# Patient Record
Sex: Female | Born: 1960
Health system: Southern US, Community
[De-identification: ages and names within clinical notes are randomized; demographics above are authoritative.]

## PROBLEM LIST (undated history)

## (undated) DIAGNOSIS — R42 Dizziness and giddiness: Secondary | ICD-10-CM

## (undated) DIAGNOSIS — T7840XA Allergy, unspecified, initial encounter: Secondary | ICD-10-CM

## (undated) DIAGNOSIS — E785 Hyperlipidemia, unspecified: Secondary | ICD-10-CM

## (undated) DIAGNOSIS — K759 Inflammatory liver disease, unspecified: Secondary | ICD-10-CM

## (undated) DIAGNOSIS — E119 Type 2 diabetes mellitus without complications: Secondary | ICD-10-CM

## (undated) DIAGNOSIS — I341 Nonrheumatic mitral (valve) prolapse: Secondary | ICD-10-CM

## (undated) DIAGNOSIS — S139XXA Sprain of joints and ligaments of unspecified parts of neck, initial encounter: Secondary | ICD-10-CM

## (undated) DIAGNOSIS — K219 Gastro-esophageal reflux disease without esophagitis: Secondary | ICD-10-CM

## (undated) DIAGNOSIS — I10 Essential (primary) hypertension: Secondary | ICD-10-CM

## (undated) HISTORY — PX: ABDOMINAL HYSTERECTOMY: SHX81

## (undated) HISTORY — DX: Gastro-esophageal reflux disease without esophagitis: K21.9

## (undated) HISTORY — PX: TUBAL LIGATION: SHX77

## (undated) HISTORY — DX: Allergy, unspecified, initial encounter: T78.40XA

## (undated) HISTORY — DX: Type 2 diabetes mellitus without complications: E11.9

## (undated) HISTORY — DX: Hyperlipidemia, unspecified: E78.5

## (undated) HISTORY — DX: Dizziness and giddiness: R42

## (undated) HISTORY — DX: Nonrheumatic mitral (valve) prolapse: I34.1

## (undated) HISTORY — DX: Essential (primary) hypertension: I10

## (undated) HISTORY — DX: Sprain of joints and ligaments of unspecified parts of neck, initial encounter: S13.9XXA

## (undated) HISTORY — PX: DILATION AND CURETTAGE OF UTERUS: SHX78

---

## 1992-07-10 HISTORY — PX: TOTAL VAGINAL HYSTERECTOMY: SHX2548

## 2005-02-16 ENCOUNTER — Ambulatory Visit: Payer: Self-pay | Admitting: Family Medicine

## 2005-03-31 ENCOUNTER — Ambulatory Visit: Payer: Self-pay | Admitting: Family Medicine

## 2006-05-11 ENCOUNTER — Ambulatory Visit: Payer: Self-pay | Admitting: Family Medicine

## 2006-05-16 ENCOUNTER — Ambulatory Visit: Payer: Self-pay | Admitting: Family Medicine

## 2009-02-23 ENCOUNTER — Ambulatory Visit: Payer: Self-pay

## 2009-03-03 ENCOUNTER — Ambulatory Visit: Payer: Self-pay

## 2010-04-13 ENCOUNTER — Ambulatory Visit: Payer: Self-pay | Admitting: Family Medicine

## 2010-04-20 ENCOUNTER — Ambulatory Visit: Payer: Self-pay | Admitting: Gastroenterology

## 2010-04-22 LAB — PATHOLOGY REPORT

## 2010-07-25 ENCOUNTER — Ambulatory Visit: Payer: Self-pay | Admitting: Gastroenterology

## 2011-04-25 ENCOUNTER — Emergency Department: Payer: Self-pay | Admitting: *Deleted

## 2011-05-29 ENCOUNTER — Ambulatory Visit: Payer: Self-pay | Admitting: General Practice

## 2011-05-30 ENCOUNTER — Ambulatory Visit: Payer: Self-pay | Admitting: Family Medicine

## 2011-09-06 ENCOUNTER — Ambulatory Visit: Payer: Self-pay | Admitting: Gastroenterology

## 2011-09-06 LAB — HM COLONOSCOPY

## 2012-05-15 ENCOUNTER — Encounter: Payer: Self-pay | Admitting: Family Medicine

## 2012-05-30 ENCOUNTER — Ambulatory Visit: Payer: Self-pay

## 2012-06-09 ENCOUNTER — Encounter: Payer: Self-pay | Admitting: Family Medicine

## 2013-06-02 ENCOUNTER — Ambulatory Visit: Payer: Self-pay

## 2014-04-15 LAB — HM PAP SMEAR

## 2014-04-21 ENCOUNTER — Ambulatory Visit: Payer: Self-pay

## 2014-06-03 ENCOUNTER — Ambulatory Visit: Payer: Self-pay

## 2014-06-03 LAB — HM MAMMOGRAPHY

## 2015-01-22 ENCOUNTER — Encounter: Payer: Self-pay | Admitting: Family Medicine

## 2015-01-22 ENCOUNTER — Ambulatory Visit (INDEPENDENT_AMBULATORY_CARE_PROVIDER_SITE_OTHER): Payer: 59 | Admitting: Family Medicine

## 2015-01-22 VITALS — BP 138/84 | HR 75 | Temp 98.6°F | Ht 62.0 in | Wt 147.5 lb

## 2015-01-22 DIAGNOSIS — R609 Edema, unspecified: Secondary | ICD-10-CM | POA: Diagnosis not present

## 2015-01-22 LAB — CBC WITH DIFFERENTIAL/PLATELET
HEMOGLOBIN: 13 g/dL (ref 11.1–15.9)
Hematocrit: 38.5 % (ref 34.0–46.6)
LYMPHS: 37 %
Lymphocytes Absolute: 1.4 10*3/uL (ref 0.7–3.1)
MCH: 30.2 pg (ref 26.6–33.0)
MCHC: 33.8 g/dL (ref 31.5–35.7)
MCV: 89 fL (ref 79–97)
MID (ABSOLUTE): 0.4 10*3/uL (ref 0.1–1.6)
MID: 11 %
Neutrophils Absolute: 2.1 10*3/uL (ref 1.4–7.0)
Neutrophils: 53 %
Platelets: 284 10*3/uL (ref 150–379)
RBC: 4.31 x10E6/uL (ref 3.77–5.28)
RDW: 13.6 % (ref 12.3–15.4)
WBC: 3.9 10*3/uL (ref 3.4–10.8)

## 2015-01-22 NOTE — Patient Instructions (Signed)

## 2015-01-22 NOTE — Progress Notes (Signed)
BP 138/84 mmHg  Pulse 75  Temp(Src) 98.6 F (37 C)  Ht 5\' 2"  (1.575 m)  Wt 147 lb 8 oz (66.906 kg)  BMI 26.97 kg/m2  SpO2 99%   Subjective:    Patient ID: Linda Boyle, female    DOB: 1961-03-27, 54 y.o.   MRN: [id number]  HPI: Linda Boyle is a 54 y.o. female  Chief Complaint  Patient presents with  . Edema    ankle's down to feet swell when she has been on her feet.    Ankles have been swelling for about 2 weeks. Used to be off and on, now almost every day. Tend to be better in the morning and then worse in the evening. No pain. Some tight feeling. On her feet most of the day. Hasn't tried anything for it right now. Had done some elevation and that has helped. Otherwise feeling well with no other concerns or complaints at this time.   Relevant past medical, surgical, family and social history reviewed and updated as indicated. Interim medical history since our last visit reviewed. Allergies and medications reviewed and updated.  Review of Systems  Constitutional: Negative.   Respiratory: Negative.   Cardiovascular: Negative.   Musculoskeletal: Negative.   Skin: Negative.   Neurological: Positive for numbness. Negative for dizziness, tremors, seizures, syncope, facial asymmetry, speech difficulty, weakness, light-headedness and headaches.   Per HPI unless specifically indicated above     Objective:    BP 138/84 mmHg  Pulse 75  Temp(Src) 98.6 F (37 C)  Ht 5\' 2"  (1.575 m)  Wt 147 lb 8 oz (66.906 kg)  BMI 26.97 kg/m2  SpO2 99%  Wt Readings from Last 3 Encounters:  01/22/15 147 lb 8 oz (66.906 kg)  10/16/14 145 lb (65.772 kg)    Physical Exam  Constitutional: She is oriented to person, place, and time. She appears well-developed and well-nourished. No distress.  HENT:  Head: Normocephalic and atraumatic.  Right Ear: Hearing normal.  Left Ear: Hearing normal.  Nose: Nose normal.  Eyes: Conjunctivae and lids are normal. Right eye exhibits no discharge.  Left eye exhibits no discharge. No scleral icterus.  Cardiovascular: Normal rate, regular rhythm, normal heart sounds and intact distal pulses.  Exam reveals no gallop and no friction rub.   No murmur heard. Pulmonary/Chest: Effort normal and breath sounds normal. No respiratory distress. She has no wheezes. She has no rales. She exhibits no tenderness.  Musculoskeletal: Normal range of motion.       Right ankle: Normal. She exhibits normal range of motion, no swelling, no ecchymosis, no deformity, no laceration and normal pulse.       Left ankle: Normal. She exhibits normal range of motion, no swelling, no ecchymosis, no deformity, no laceration and normal pulse.       Right lower leg: Normal.       Left lower leg: Normal.  Negative Homan's, Negative squeeze, Trace edema   Neurological: She is alert and oriented to person, place, and time.  Skin: Skin is warm, dry and intact. No rash noted. No erythema. No pallor.  Psychiatric: She has a normal mood and affect. Her speech is normal and behavior is normal. Judgment and thought content normal. Cognition and memory are normal.  Nursing note and vitals reviewed.     Assessment & Plan:   Problem List Items Addressed This Visit    None    Visit Diagnoses    Edema    -  Primary  Likely due to the heat. Advised compression stockings and elevation. Checking CBC and CMP today to look for other causes. No sign of DVT. [employer].     Relevant Orders    Comprehensive metabolic panel    CBC With Differential/Platelet        Follow up plan: Return if symptoms worsen or fail to improve.

## 2015-01-23 LAB — COMPREHENSIVE METABOLIC PANEL
ALK PHOS: 70 IU/L (ref 39–117)
ALT: 25 IU/L (ref 0–32)
AST: 26 IU/L (ref 0–40)
Albumin/Globulin Ratio: 1.8 (ref 1.1–2.5)
Albumin: 4.2 g/dL (ref 3.5–5.5)
BUN/Creatinine Ratio: 12 (ref 9–23)
BUN: 9 mg/dL (ref 6–24)
Bilirubin Total: 0.4 mg/dL (ref 0.0–1.2)
CHLORIDE: 102 mmol/L (ref 97–108)
CO2: 23 mmol/L (ref 18–29)
Calcium: 9.4 mg/dL (ref 8.7–10.2)
Creatinine, Ser: 0.78 mg/dL (ref 0.57–1.00)
GFR calc Af Amer: 99 mL/min/{1.73_m2} (ref >59)
GFR calc non Af Amer: 86 mL/min/{1.73_m2} (ref >59)
Globulin, Total: 2.4 g/dL (ref 1.5–4.5)
Glucose: 98 mg/dL (ref 65–99)
Potassium: 3.7 mmol/L (ref 3.5–5.2)
SODIUM: 142 mmol/L (ref 134–144)
Total Protein: 6.6 g/dL (ref 6.0–8.5)

## 2015-01-25 ENCOUNTER — Encounter: Payer: Self-pay | Admitting: Family Medicine

## 2015-06-17 ENCOUNTER — Encounter: Payer: Self-pay | Admitting: Unknown Physician Specialty

## 2015-06-28 ENCOUNTER — Encounter: Payer: 59 | Admitting: Unknown Physician Specialty

## 2015-07-02 ENCOUNTER — Encounter: Payer: 59 | Admitting: Unknown Physician Specialty

## 2015-07-16 ENCOUNTER — Encounter: Payer: Self-pay | Admitting: Unknown Physician Specialty

## 2015-07-16 ENCOUNTER — Ambulatory Visit (INDEPENDENT_AMBULATORY_CARE_PROVIDER_SITE_OTHER): Payer: 59 | Admitting: Unknown Physician Specialty

## 2015-07-16 VITALS — BP 131/83 | HR 76 | Temp 98.4°F | Ht 60.7 in | Wt 154.8 lb

## 2015-07-16 DIAGNOSIS — E1159 Type 2 diabetes mellitus with other circulatory complications: Secondary | ICD-10-CM | POA: Insufficient documentation

## 2015-07-16 DIAGNOSIS — Z0000 Encounter for general adult medical examination without abnormal findings: Secondary | ICD-10-CM

## 2015-07-16 DIAGNOSIS — E785 Hyperlipidemia, unspecified: Secondary | ICD-10-CM | POA: Diagnosis not present

## 2015-07-16 DIAGNOSIS — I10 Essential (primary) hypertension: Secondary | ICD-10-CM

## 2015-07-16 DIAGNOSIS — J309 Allergic rhinitis, unspecified: Secondary | ICD-10-CM | POA: Diagnosis not present

## 2015-07-16 DIAGNOSIS — K219 Gastro-esophageal reflux disease without esophagitis: Secondary | ICD-10-CM | POA: Diagnosis not present

## 2015-07-16 LAB — MICROALBUMIN, URINE WAIVED
CREATININE, URINE WAIVED: 300 mg/dL (ref 10–300)
MICROALB, UR WAIVED: 30 mg/L — AB (ref 0–19)
Microalb/Creat Ratio: <30 mg/g (ref <30)

## 2015-07-16 MED ORDER — ATORVASTATIN CALCIUM 40 MG PO TABS: 40.0000 mg | ORAL_TABLET | Freq: Every day | ORAL | Status: DC

## 2015-07-16 MED ORDER — VERAPAMIL HCL ER 180 MG PO CP24: 180.0000 mg | ORAL_CAPSULE | Freq: Every day | ORAL | Status: DC

## 2015-07-16 MED ORDER — FEXOFENADINE HCL 180 MG PO TABS: 180.0000 mg | ORAL_TABLET | Freq: Every day | ORAL | Status: DC | PRN

## 2015-07-16 MED ORDER — OMEPRAZOLE 20 MG PO CPDR
20.0000 mg | DELAYED_RELEASE_CAPSULE | Freq: Every day | ORAL | Status: DC
Start: 2015-07-16 — End: 2016-01-14

## 2015-07-16 MED ORDER — FLUTICASONE PROPIONATE 50 MCG/ACT NA SUSP: 2.0000 | Freq: Every day | NASAL | Status: DC

## 2015-07-16 NOTE — Progress Notes (Signed)
BP 131/83 mmHg  Pulse 76  Temp(Src) 98.4 F (36.9 C)  Ht 5' 0.7" (1.542 m)  Wt 154 lb 12.8 oz (70.217 kg)  BMI 29.53 kg/m2  SpO2 97%   Subjective:    Patient ID: Linda Boyle, female    DOB: 06-Feb-1961, 55 y.o.   MRN: [id number]  HPI: Linda Boyle is a 55 y.o. female  Chief Complaint  Patient presents with  . Annual Exam   Hypertension Using medications without difficulty Average home BPs   No problems or lightheadedness No chest pain with exertion or shortness of breath No Edema   Hyperlipidemia Using medications without problems: No Muscle aches  Diet compliance: good Exercise:walks a lot at work  Allergic rhinnitis Stable on Allegra and Flonase  GERD Stable with daily use of Omeprazole.    Relevant past medical, surgical, family and social history reviewed and updated as indicated. Interim medical history since our last visit reviewed. Allergies and medications reviewed and updated.  Review of Systems  Constitutional: Negative.   HENT: Negative.   Eyes: Negative.   Respiratory: Negative.   Cardiovascular: Negative.   Gastrointestinal: Negative.        Going to gyn for pain in rectal area  Endocrine: Negative.   Genitourinary: Negative.   Musculoskeletal: Negative.   Skin: Negative.   Allergic/Immunologic: Negative.   Neurological: Negative.   Hematological: Negative.   Psychiatric/Behavioral: Negative.     Per HPI unless specifically indicated above     Objective:    BP 131/83 mmHg  Pulse 76  Temp(Src) 98.4 F (36.9 C)  Ht 5' 0.7" (1.542 m)  Wt 154 lb 12.8 oz (70.217 kg)  BMI 29.53 kg/m2  SpO2 97%  Wt Readings from Last 3 Encounters:  07/16/15 154 lb 12.8 oz (70.217 kg)  01/22/15 147 lb 8 oz (66.906 kg)  10/16/14 145 lb (65.772 kg)    Physical Exam  Constitutional: She is oriented to person, place, and time. She appears well-developed and well-nourished.  HENT:  Head: Normocephalic and atraumatic.  Eyes: Pupils are  equal, round, and reactive to light. Right eye exhibits no discharge. Left eye exhibits no discharge. No scleral icterus.  Neck: Normal range of motion. Neck supple. Carotid bruit is not present. No thyromegaly present.  Cardiovascular: Normal rate, regular rhythm and normal heart sounds.  Exam reveals no gallop and no friction rub.   No murmur heard. Pulmonary/Chest: Effort normal and breath sounds normal. No respiratory distress. She has no wheezes. She has no rales.  Abdominal: Soft. Bowel sounds are normal. There is no tenderness. There is no rebound.  Genitourinary: No breast swelling, tenderness or discharge.  Musculoskeletal: Normal range of motion.  Lymphadenopathy:    She has no cervical adenopathy.  Neurological: She is alert and oriented to person, place, and time.  Skin: Skin is warm, dry and intact. No rash noted.  Psychiatric: She has a normal mood and affect. Her speech is normal and behavior is normal. Judgment and thought content normal. Cognition and memory are normal.      Assessment & Plan:   Problem List Items Addressed This Visit      Unprioritized   Allergic rhinitis   GERD (gastroesophageal reflux disease)   Relevant Medications   omeprazole (PRILOSEC) 20 MG capsule   Hyperlipidemia   Relevant Medications   atorvastatin (LIPITOR) 40 MG tablet   verapamil (VERELAN PM) 180 MG 24 hr capsule   Other Relevant Orders   Lipid Panel w/o Chol/HDL Ratio  Benign hypertension   Relevant Medications   atorvastatin (LIPITOR) 40 MG tablet   verapamil (VERELAN PM) 180 MG 24 hr capsule   Other Relevant Orders   Comprehensive metabolic panel   Microalbumin, Urine Waived   Uric acid    Other Visit Diagnoses    Annual physical exam    -  Primary    Relevant Orders    CBC with Differential/Platelet    HIV antibody    Hepatitis C antibody    TSH    MM DIGITAL SCREENING BILATERAL       Diagnosis stable.  Continue present treatment  Follow up plan: Return in about  6 months (around 01/13/2016).

## 2015-07-17 LAB — COMPREHENSIVE METABOLIC PANEL
ALBUMIN: 4.1 g/dL (ref 3.5–5.5)
ALK PHOS: 80 IU/L (ref 39–117)
ALT: 85 IU/L — ABNORMAL HIGH (ref 0–32)
AST: 49 IU/L — ABNORMAL HIGH (ref 0–40)
Albumin/Globulin Ratio: 1.7 (ref 1.1–2.5)
BUN / CREAT RATIO: 16 (ref 9–23)
BUN: 12 mg/dL (ref 6–24)
Bilirubin Total: 0.3 mg/dL (ref 0.0–1.2)
CO2: 22 mmol/L (ref 18–29)
CREATININE: 0.73 mg/dL (ref 0.57–1.00)
Calcium: 8.9 mg/dL (ref 8.7–10.2)
Chloride: 102 mmol/L (ref 96–106)
GFR calc Af Amer: 107 mL/min/{1.73_m2} (ref >59)
GFR, EST NON AFRICAN AMERICAN: 93 mL/min/{1.73_m2} (ref >59)
GLOBULIN, TOTAL: 2.4 g/dL (ref 1.5–4.5)
Glucose: 101 mg/dL — ABNORMAL HIGH (ref 65–99)
Potassium: 3.7 mmol/L (ref 3.5–5.2)
Sodium: 141 mmol/L (ref 134–144)
Total Protein: 6.5 g/dL (ref 6.0–8.5)

## 2015-07-17 LAB — CBC WITH DIFFERENTIAL/PLATELET
BASOS ABS: 0 10*3/uL (ref 0.0–0.2)
Basos: 0 %
EOS (ABSOLUTE): 0.1 10*3/uL (ref 0.0–0.4)
Eos: 2 %
HEMATOCRIT: 37.8 % (ref 34.0–46.6)
HEMOGLOBIN: 12.7 g/dL (ref 11.1–15.9)
Immature Grans (Abs): 0 10*3/uL (ref 0.0–0.1)
Immature Granulocytes: 0 %
LYMPHS ABS: 1.9 10*3/uL (ref 0.7–3.1)
Lymphs: 36 %
MCH: 28.5 pg (ref 26.6–33.0)
MCHC: 33.6 g/dL (ref 31.5–35.7)
MCV: 85 fL (ref 79–97)
MONOCYTES: 8 %
MONOS ABS: 0.4 10*3/uL (ref 0.1–0.9)
NEUTROS ABS: 2.9 10*3/uL (ref 1.4–7.0)
Neutrophils: 54 %
Platelets: 305 10*3/uL (ref 150–379)
RBC: 4.45 x10E6/uL (ref 3.77–5.28)
RDW: 14.2 % (ref 12.3–15.4)
WBC: 5.3 10*3/uL (ref 3.4–10.8)

## 2015-07-17 LAB — LIPID PANEL W/O CHOL/HDL RATIO
CHOLESTEROL TOTAL: 173 mg/dL (ref 100–199)
HDL: 47 mg/dL (ref >39)
LDL Calculated: 100 mg/dL — ABNORMAL HIGH (ref 0–99)
TRIGLYCERIDES: 132 mg/dL (ref 0–149)
VLDL Cholesterol Cal: 26 mg/dL (ref 5–40)

## 2015-07-17 LAB — TSH: TSH: 1.93 u[IU]/mL (ref 0.450–4.500)

## 2015-07-17 LAB — HIV ANTIBODY (ROUTINE TESTING W REFLEX): HIV SCREEN 4TH GENERATION: NONREACTIVE

## 2015-07-17 LAB — URIC ACID: URIC ACID: 5.2 mg/dL (ref 2.5–7.1)

## 2015-07-17 LAB — HEPATITIS C ANTIBODY

## 2015-07-21 ENCOUNTER — Other Ambulatory Visit: Payer: Self-pay | Admitting: Unknown Physician Specialty

## 2015-07-21 ENCOUNTER — Telehealth: Payer: Self-pay

## 2015-07-21 DIAGNOSIS — R748 Abnormal levels of other serum enzymes: Secondary | ICD-10-CM

## 2015-07-21 NOTE — Telephone Encounter (Signed)
Patient called and stated she was returning a call from yesterday.

## 2015-07-21 NOTE — Telephone Encounter (Signed)
Accidentally closed other encounter before routing it to [place]. But patient called and stated she was returning a call from yesterday. I did not call her so Linda Boyle did you call her?

## 2015-07-21 NOTE — Telephone Encounter (Signed)
Discussed labs with pt and elevated liver enzymes.  Recheck in 1 month

## 2015-07-21 NOTE — Telephone Encounter (Signed)
Patient would like to be called back at [phone].

## 2015-08-13 ENCOUNTER — Ambulatory Visit
Admission: RE | Admit: 2015-08-13 | Discharge: 2015-08-13 | Disposition: A | Discharge status: Home / Self Care | Payer: 59 | Source: Ambulatory Visit | Attending: Unknown Physician Specialty | Admitting: Unknown Physician Specialty

## 2015-08-13 DIAGNOSIS — Z0000 Encounter for general adult medical examination without abnormal findings: Secondary | ICD-10-CM

## 2015-08-13 DIAGNOSIS — Z1231 Encounter for screening mammogram for malignant neoplasm of breast: Secondary | ICD-10-CM | POA: Diagnosis not present

## 2015-08-18 ENCOUNTER — Ambulatory Visit (INDEPENDENT_AMBULATORY_CARE_PROVIDER_SITE_OTHER): Payer: 59 | Admitting: Obstetrics and Gynecology

## 2015-08-18 ENCOUNTER — Encounter: Payer: Self-pay | Admitting: Obstetrics and Gynecology

## 2015-08-18 VITALS — BP 132/76 | HR 75 | Ht 61.0 in | Wt 156.6 lb

## 2015-08-18 DIAGNOSIS — N949 Unspecified condition associated with female genital organs and menstrual cycle: Secondary | ICD-10-CM

## 2015-08-18 DIAGNOSIS — N951 Menopausal and female climacteric states: Secondary | ICD-10-CM | POA: Diagnosis not present

## 2015-08-18 DIAGNOSIS — N952 Postmenopausal atrophic vaginitis: Secondary | ICD-10-CM

## 2015-08-18 DIAGNOSIS — R102 Pelvic and perineal pain: Secondary | ICD-10-CM

## 2015-08-18 NOTE — Progress Notes (Signed)
GYNECOLOGY CLINIC PROGRESS NOTE  Subjective:    Patient ID: Linda Boyle, female    DOB: 04/09/1961, 55 y.o.   MRN: [id number]  Chief Complaint: Anal pain  HPI  Patient is a 55 y.o. [id number] postmenopausal female who presents as a referral from [facility] for complaints of rectal and pelvic pain.  Patient notes pain has been ongoing x 3 years.  Described as a tightening of the rectal area that radiates forward to vaginal region. Is progressively worsening.  Sensation lasts for ~ 2-3 min then resolves.  Does not take anything for pain as she is unsure of what she could take.  No aggravating or alleviating factors.  No known triggers.  Denies dyspareunia as she is not currently sexually active.  Denies dysuria or constipation.    OB History  Gravida Para Term Preterm AB SAB TAB Ectopic Multiple Living  3             # Outcome Date GA Lbr Len/2nd Weight Sex Delivery Anes PTL Lv  3 Gravida      Vag-Spont     2 Gravida      Vag-Spont     1 Gravida      Vag-Spont         Past Medical History  Diagnosis Date  . Mitral valve prolapse   . GERD (gastroesophageal reflux disease)   . Hypertension   . Hyperlipidemia   . Allergy   . Vertigo   . Neck sprain     Past Surgical History  Procedure Laterality Date  . Dilation and curettage of uterus  [id number]  . Tubal ligation    . Total vaginal hysterectomy  1994    Ovaries remain    Family History  Problem Relation Age of Onset  . Diabetes Mother   . Alzheimer's disease Father   . Hypertension Father   . Diabetes Sister   . Hypertension Brother   . Asthma Brother   . Diabetes Brother   . Hypertension Sister     Social History   Social History  . Marital Status: Married    Spouse Name: N/A  . Number of Children: N/A  . Years of Education: N/A   Occupational History  . Not on file.   Social History Main Topics  . Smoking status: Never Smoker   . Smokeless tobacco: Never Used  . Alcohol Use: No    . Drug Use: No  . Sexual Activity: Not Currently   Other Topics Concern  . Not on file   Social History Narrative   Current Outpatient Prescriptions on File Prior to Visit  Medication Sig Dispense Refill  . atorvastatin (LIPITOR) 40 MG tablet Take 1 tablet (40 mg total) by mouth daily. 90 tablet 1  . Cholecalciferol (VITAMIN D-3) 1000 UNITS CAPS Take 1,000 Units by mouth daily.    . fexofenadine (ALLEGRA) 180 MG tablet Take 1 tablet (180 mg total) by mouth daily as needed for allergies or rhinitis. 90 tablet 1  . fluticasone (FLONASE) 50 MCG/ACT nasal spray Place 2 sprays into both nostrils daily. 16 g 12  . loratadine (CLARITIN) 10 MG tablet Take 10 mg by mouth daily.    Marland Kitchen omeprazole (PRILOSEC) 20 MG capsule Take 1 capsule (20 mg total) by mouth daily. 90 capsule 1  . verapamil (VERELAN PM) 180 MG 24 hr capsule Take 1 capsule (180 mg total) by mouth at bedtime. 90 capsule 1   No current facility-administered medications on  file prior to visit.    No Known Allergies   Review of Systems A comprehensive review of systems was negative except for: Genitourinary: positive for hot flashes and vaginal dryness and night sweats and what's noted in HPI above   Objective:   Blood pressure 132/76, pulse 75, height 5\' 1"  (1.549 m), weight 156 lb 9.6 oz (71.033 kg). General appearance: alert and no distress Abdomen: soft, non-tender; bowel sounds normal; no masses,  no organomegaly Pelvic: cervix normal in appearance, external genitalia normal, no adnexal masses or tenderness, no cervical motion tenderness, positive findings: vaginal mucosa atrophic, rectovaginal septum normal, uterus normal size, shape, and consistency and negative Q-tip test on vulva Extremities: extremities normal, atraumatic, no cyanosis or edema Neurologic: Grossly normal Rectal: no masses, normal sphincter tone, no tenderness.   Assessment:   Chronic perineal pain Menopausal vasomotor symptoms Vaginal  atrophy   Plan:   Patient with bothersome menopausal vasomotor symptoms. Discussed lifestyle interventions such as wearing light clothing, remaining in cool environments, having fan/air conditioner in the room, avoiding hot beverages etc.  Discussed using hormone therapy and concerns about increased risk of heart disease, cerebrovascular disease, thromboembolic disease,  and breast cancer.  Also discussed other medical options such as Paxil, Effexor or Neurontin.   Also discussed alternative therapies such as herbal remedies but cautioned that most of the products contained phytoestrogens (plant estrogens) in unregulated amounts which can have the same effects on the body as the pharmaceutical estrogen preparations.   Patient opted for transdermal estrogen therapy for now.  She will return in 6 weeks for reevaluation.  Will also help vaginal atrophy and perhaps pain symptoms.  Also discussed options of neuropathic medications (i.e. Gabapentin), pelvic floor physical therapy for perineal pain.  Patient desires to think about this. Can use ice packs to area for now when pain arises.    Rubie Maid, MD [facility]

## 2015-08-21 ENCOUNTER — Encounter: Payer: Self-pay | Admitting: Obstetrics and Gynecology

## 2015-09-17 ENCOUNTER — Other Ambulatory Visit: Payer: 59

## 2015-09-17 DIAGNOSIS — R748 Abnormal levels of other serum enzymes: Secondary | ICD-10-CM

## 2015-09-18 LAB — COMPREHENSIVE METABOLIC PANEL
ALK PHOS: 90 IU/L (ref 39–117)
ALT: 286 IU/L — AB (ref 0–32)
AST: 146 IU/L — AB (ref 0–40)
Albumin/Globulin Ratio: 1.6 (ref 1.1–2.5)
Albumin: 4.1 g/dL (ref 3.5–5.5)
BUN/Creatinine Ratio: 11 (ref 9–23)
BUN: 9 mg/dL (ref 6–24)
Bilirubin Total: 0.6 mg/dL (ref 0.0–1.2)
CALCIUM: 9.2 mg/dL (ref 8.7–10.2)
CO2: 26 mmol/L (ref 18–29)
CREATININE: 0.82 mg/dL (ref 0.57–1.00)
Chloride: 100 mmol/L (ref 96–106)
GFR calc Af Amer: 93 mL/min/{1.73_m2} (ref >59)
GFR, EST NON AFRICAN AMERICAN: 81 mL/min/{1.73_m2} (ref >59)
GLOBULIN, TOTAL: 2.6 g/dL (ref 1.5–4.5)
GLUCOSE: 99 mg/dL (ref 65–99)
Potassium: 3.6 mmol/L (ref 3.5–5.2)
SODIUM: 142 mmol/L (ref 134–144)
Total Protein: 6.7 g/dL (ref 6.0–8.5)

## 2015-09-19 ENCOUNTER — Other Ambulatory Visit: Payer: Self-pay | Admitting: Unknown Physician Specialty

## 2015-09-19 DIAGNOSIS — R748 Abnormal levels of other serum enzymes: Secondary | ICD-10-CM

## 2015-09-19 NOTE — Progress Notes (Signed)
Ordered abdominal US for elevated liver enzymes

## 2015-09-29 ENCOUNTER — Ambulatory Visit
Admission: RE | Admit: 2015-09-29 | Discharge: 2015-09-29 | Disposition: A | Discharge status: Home / Self Care | Payer: 59 | Source: Ambulatory Visit | Attending: Unknown Physician Specialty | Admitting: Unknown Physician Specialty

## 2015-09-29 ENCOUNTER — Ambulatory Visit (INDEPENDENT_AMBULATORY_CARE_PROVIDER_SITE_OTHER): Payer: 59 | Admitting: Obstetrics and Gynecology

## 2015-09-29 ENCOUNTER — Telehealth: Payer: Self-pay | Admitting: Unknown Physician Specialty

## 2015-09-29 ENCOUNTER — Encounter: Payer: Self-pay | Admitting: Obstetrics and Gynecology

## 2015-09-29 VITALS — BP 143/85 | HR 75 | Ht 61.0 in | Wt 158.8 lb

## 2015-09-29 DIAGNOSIS — R748 Abnormal levels of other serum enzymes: Secondary | ICD-10-CM | POA: Diagnosis not present

## 2015-09-29 DIAGNOSIS — N952 Postmenopausal atrophic vaginitis: Secondary | ICD-10-CM | POA: Diagnosis not present

## 2015-09-29 DIAGNOSIS — R740 Nonspecific elevation of levels of transaminase and lactic acid dehydrogenase [LDH]: Secondary | ICD-10-CM | POA: Diagnosis not present

## 2015-09-29 DIAGNOSIS — N949 Unspecified condition associated with female genital organs and menstrual cycle: Secondary | ICD-10-CM

## 2015-09-29 DIAGNOSIS — N951 Menopausal and female climacteric states: Secondary | ICD-10-CM | POA: Diagnosis not present

## 2015-09-29 DIAGNOSIS — R7401 Elevation of levels of liver transaminase levels: Secondary | ICD-10-CM

## 2015-09-29 DIAGNOSIS — R102 Pelvic and perineal pain: Secondary | ICD-10-CM

## 2015-09-29 MED ORDER — ESTRADIOL 0.05 MG/24HR TD PTTW: 1.0000 | MEDICATED_PATCH | TRANSDERMAL | Status: DC

## 2015-09-29 NOTE — Telephone Encounter (Signed)
Discussed with pt about [place].  We will recheck her CMP.  F/U with those results.  Stay off of Atorvastatin for now.  Check Ferritin

## 2015-09-29 NOTE — Progress Notes (Signed)
    GYNECOLOGY PROGRESS NOTE  Subjective:    Patient ID: Linda Boyle, female    DOB: 1960/12/25, 55 y.o.   MRN: [id number]  HPI  Patient is a 55 y.o. G61P3003 female who presents for f/u of intermittent perineal/perianal pain, menopausal symptoms and vaginal atrophy.  Was given HRT  Samples (transdrmal patches) last visit. Notes using samples helped with menopausal symptoms, however after samples ran out, patient did not call for prescription. Notes that the perineal/perianal pain is still intermittent, lasting for few minutes.  Has not tried any of the interventions discussed last visit.    The following portions of the patient's history were reviewed and updated as appropriate: allergies, current medications, past family history, past medical history, past social history, past surgical history and problem list.  Review of Systems Pertinent items noted in HPI and remainder of comprehensive ROS otherwise negative.   Objective:   Blood pressure 143/85, pulse 75, height 5\' 1"  (1.549 m), weight 158 lb 12.8 oz (72.031 kg). General appearance: alert and no distress Exam deferred   Assessment:   Menopausal symptoms Vaginal atrophy Perineal/perianal pain.   Plan:   - Will send in prescription for Minivelle patch (0.05 mg), however after further discussion with patient, she notes that she is undergoing workup for findings of elevated liver enzymes.  Had abdominal ultrasound this morning and is due for more labs later today.  Advised to hold on using estrogen patches until workup completed in case there are any contraindications to use with any new diagnosis. Patient notes understanding. To notify MD of any new diagnoses related to her liver.  - Reiterated options of neuropathic medications (i.e. Gabapentin), pelvic floor physical therapy for perineal pain. Patient desires to hold on this.  Can use ice packs to area for now when pain arises.  - To f/u in 6 months if HRT is able to be  restarted.      Rubie Maid, MD [facility]

## 2015-09-30 ENCOUNTER — Other Ambulatory Visit: Payer: 59

## 2015-09-30 ENCOUNTER — Other Ambulatory Visit: Payer: Self-pay | Admitting: Unknown Physician Specialty

## 2015-09-30 DIAGNOSIS — R740 Nonspecific elevation of levels of transaminase and lactic acid dehydrogenase [LDH]: Secondary | ICD-10-CM | POA: Diagnosis not present

## 2015-10-01 ENCOUNTER — Ambulatory Visit: Payer: 59

## 2015-10-01 ENCOUNTER — Telehealth: Payer: Self-pay | Admitting: Unknown Physician Specialty

## 2015-10-01 LAB — COMPREHENSIVE METABOLIC PANEL
A/G RATIO: 1.5 (ref 1.2–2.2)
ALBUMIN: 3.8 g/dL (ref 3.5–5.5)
ALT: 185 IU/L — ABNORMAL HIGH (ref 0–32)
AST: 82 IU/L — ABNORMAL HIGH (ref 0–40)
Alkaline Phosphatase: 76 IU/L (ref 39–117)
BILIRUBIN TOTAL: 0.3 mg/dL (ref 0.0–1.2)
BUN / CREAT RATIO: 18 (ref 9–23)
BUN: 13 mg/dL (ref 6–24)
CALCIUM: 9.3 mg/dL (ref 8.7–10.2)
CHLORIDE: 103 mmol/L (ref 96–106)
CO2: 25 mmol/L (ref 18–29)
Creatinine, Ser: 0.74 mg/dL (ref 0.57–1.00)
GFR, EST AFRICAN AMERICAN: 105 mL/min/{1.73_m2} (ref >59)
GFR, EST NON AFRICAN AMERICAN: 91 mL/min/{1.73_m2} (ref >59)
Globulin, Total: 2.5 g/dL (ref 1.5–4.5)
Glucose: 112 mg/dL — ABNORMAL HIGH (ref 65–99)
POTASSIUM: 3.6 mmol/L (ref 3.5–5.2)
Sodium: 142 mmol/L (ref 134–144)
TOTAL PROTEIN: 6.3 g/dL (ref 6.0–8.5)

## 2015-10-01 MED ORDER — ATORVASTATIN CALCIUM 20 MG PO TABS: 20.0000 mg | ORAL_TABLET | Freq: Every day | ORAL | Status: DC

## 2015-10-01 NOTE — Telephone Encounter (Signed)
Discussed with patient that AST/ALT are now 82/185 which is improved.  Restart Atorvastatin at 20 mg.  Recheck at next visit in 3-4 months.  Pt ed to decrease simple carbohydrates

## 2015-10-02 LAB — FERRITIN: Ferritin: 106 ng/mL (ref 15–150)

## 2015-10-02 LAB — SPECIMEN STATUS REPORT

## 2015-12-30 ENCOUNTER — Encounter: Payer: Self-pay | Admitting: Obstetrics and Gynecology

## 2015-12-30 ENCOUNTER — Ambulatory Visit: Payer: 59 | Admitting: Obstetrics and Gynecology

## 2015-12-30 ENCOUNTER — Ambulatory Visit (INDEPENDENT_AMBULATORY_CARE_PROVIDER_SITE_OTHER): Payer: 59 | Admitting: Obstetrics and Gynecology

## 2015-12-30 VITALS — BP 131/74 | HR 66 | Ht 61.0 in | Wt 158.1 lb

## 2015-12-30 DIAGNOSIS — L658 Other specified nonscarring hair loss: Secondary | ICD-10-CM | POA: Diagnosis not present

## 2015-12-30 DIAGNOSIS — N951 Menopausal and female climacteric states: Secondary | ICD-10-CM

## 2015-12-30 DIAGNOSIS — T887XXA Unspecified adverse effect of drug or medicament, initial encounter: Secondary | ICD-10-CM | POA: Diagnosis not present

## 2015-12-30 DIAGNOSIS — T50905A Adverse effect of unspecified drugs, medicaments and biological substances, initial encounter: Secondary | ICD-10-CM

## 2015-12-30 NOTE — Progress Notes (Signed)
    GYNECOLOGY PROGRESS NOTE  Subjective:    Patient ID: Linda Boyle, female    DOB: Feb 21, 1961, 55 y.o.   MRN: [id number]  HPI  Patient is a 55 y.o. G64P0 female who presents for 3 month f/u of HRT therapy.  Patient notes that she did not use the Minivelle that was prescribed due to concerns for hair loss.  Notes that she noted a small amount of hair loss while using the samples, and so did not fill prescription.  Inquires into other options for management of menopausal symptoms (hot flushes, night sweats).  Notes that flushes have intensified in severity since last visit.   The following portions of the patient's history were reviewed and updated as appropriate: allergies, current medications, past family history, past medical history, past social history, past surgical history and problem list.  Review of Systems Pertinent items noted in HPI and remainder of comprehensive ROS otherwise negative.   Objective:   Blood pressure 131/74, pulse 66, height 5\' 1"  (1.549 m), weight 158 lb 1.6 oz (71.714 kg). General appearance: alert and no distress Integumentary/Skin: Scalp with no bald patches noted, no rashions or lesions.   Assessment:   Menopausal symptoms Drug-related hair loss  Plan:   Patient with bothersome menopausal vasomotor symptoms. Discussed option of trying a different HRT medication (i.e. Oral preparation such as Premarin), or other medical options such as Brisdelle/Paxil, Effexor or Neurontin.   Also discussed alternative therapies such as herbal remedies but cautioned that most of the products contained phytoestrogens (plant estrogens) in unregulated amounts which can have the same effects on the body as the pharmaceutical estrogen preparations.   Patient opted for trial of Brisdelle.  2 week sample given, patient to call back if symptoms well-managed with medication and can call in a prescription.  If medication helps and prescription desired, will have patient f/u in 6-8  weeks.   A total of 15 minutes were spent face-to-face with the patient during this encounter and over half of that time dealt with counseling and coordination of care.  Rubie Maid, MD [facility]

## 2016-01-14 ENCOUNTER — Ambulatory Visit (INDEPENDENT_AMBULATORY_CARE_PROVIDER_SITE_OTHER): Payer: 59 | Admitting: Unknown Physician Specialty

## 2016-01-14 ENCOUNTER — Encounter: Payer: Self-pay | Admitting: Unknown Physician Specialty

## 2016-01-14 VITALS — BP 126/80 | HR 69 | Temp 97.9°F | Ht 61.1 in | Wt 158.2 lb

## 2016-01-14 DIAGNOSIS — I10 Essential (primary) hypertension: Secondary | ICD-10-CM | POA: Diagnosis not present

## 2016-01-14 DIAGNOSIS — R5383 Other fatigue: Secondary | ICD-10-CM

## 2016-01-14 DIAGNOSIS — E785 Hyperlipidemia, unspecified: Secondary | ICD-10-CM | POA: Diagnosis not present

## 2016-01-14 DIAGNOSIS — R7401 Elevation of levels of liver transaminase levels: Secondary | ICD-10-CM

## 2016-01-14 DIAGNOSIS — R740 Nonspecific elevation of levels of transaminase and lactic acid dehydrogenase [LDH]: Secondary | ICD-10-CM

## 2016-01-14 MED ORDER — VERAPAMIL HCL ER 180 MG PO CP24: 180.0000 mg | ORAL_CAPSULE | Freq: Every day | ORAL | Status: DC

## 2016-01-14 MED ORDER — OMEPRAZOLE 20 MG PO CPDR: 20.0000 mg | DELAYED_RELEASE_CAPSULE | Freq: Every day | ORAL | Status: DC

## 2016-01-14 NOTE — Progress Notes (Signed)
BP 126/80 mmHg  Pulse 69  Temp(Src) 97.9 F (36.6 C)  Ht 5' 1.1" (1.552 m)  Wt 158 lb 3.2 oz (71.759 kg)  BMI 29.79 kg/m2  SpO2 96%  LMP 10/14/1992 (Approximate)   Subjective:    Patient ID: Linda Boyle, female    DOB: [birth year], 55 y.o.   MRN: [id number]  HPI: Linda Boyle is a 55 y.o. female  Chief Complaint  Patient presents with  . Hyperlipidemia  . Hypertension   Hypertension Using medications without difficulty Average home BPs Not checking   No problems or lightheadedness No chest pain with exertion or shortness of breath No Edema   Hyperlipidemia Using medications without problems: No Muscle aches none Diet compliance: Watches what she eats and tries to eat low in sugar and fat Exercise: Walks up stairs  Elevated liver enzymes Noted last visit.    Fatigue Tired in the last month  Relevant past medical, surgical, family and social history reviewed and updated as indicated. Interim medical history since our last visit reviewed. Allergies and medications reviewed and updated.  Review of Systems  Per HPI unless specifically indicated above     Objective:    BP 126/80 mmHg  Pulse 69  Temp(Src) 97.9 F (36.6 C)  Ht 5' 1.1" (1.552 m)  Wt 158 lb 3.2 oz (71.759 kg)  BMI 29.79 kg/m2  SpO2 96%  LMP 10/14/1992 (Approximate)  Wt Readings from Last 3 Encounters:  01/14/16 158 lb 3.2 oz (71.759 kg)  12/30/15 158 lb 1.6 oz (71.714 kg)  09/29/15 158 lb 12.8 oz (72.031 kg)    Physical Exam  Constitutional: She is oriented to person, place, and time. She appears well-developed and well-nourished. No distress.  HENT:  Head: Normocephalic and atraumatic.  Eyes: Conjunctivae and lids are normal. Right eye exhibits no discharge. Left eye exhibits no discharge. No scleral icterus.  Neck: Normal range of motion. Neck supple. No JVD present. Carotid bruit is not present.  Cardiovascular: Normal rate, regular rhythm and normal heart sounds.    Pulmonary/Chest: Effort normal and breath sounds normal.  Abdominal: Normal appearance. There is no splenomegaly or hepatomegaly.  Musculoskeletal: Normal range of motion.  Neurological: She is alert and oriented to person, place, and time.  Skin: Skin is warm, dry and intact. No rash noted. No pallor.  Psychiatric: She has a normal mood and affect. Her behavior is normal. Judgment and thought content normal.    Results for orders placed or performed in visit on 09/30/15  Comprehensive metabolic panel  Result Value Ref Range   Glucose 112 (H) 65 - 99 mg/dL   BUN 13 6 - 24 mg/dL   Creatinine, Ser 0.74 0.57 - 1.00 mg/dL   GFR calc non Af Amer 91 >59 mL/min/1.73   GFR calc Af Amer 105 >59 mL/min/1.73   BUN/Creatinine Ratio 18 9 - 23   Sodium 142 134 - 144 mmol/L   Potassium 3.6 3.5 - 5.2 mmol/L   Chloride 103 96 - 106 mmol/L   CO2 25 18 - 29 mmol/L   Calcium 9.3 8.7 - 10.2 mg/dL   Total Protein 6.3 6.0 - 8.5 g/dL   Albumin 3.8 3.5 - 5.5 g/dL   Globulin, Total 2.5 1.5 - 4.5 g/dL   Albumin/Globulin Ratio 1.5 1.2 - 2.2   Bilirubin Total 0.3 0.0 - 1.2 mg/dL   Alkaline Phosphatase 76 39 - 117 IU/L   AST 82 (H) 0 - 40 IU/L   ALT 185 (H) 0 -  32 IU/L  Ferritin  Result Value Ref Range   Ferritin 106 15 - 150 ng/mL  Specimen status report  Result Value Ref Range   specimen status report Comment       Assessment & Plan:   Problem List Items Addressed This Visit      Unprioritized   Benign hypertension   Relevant Medications   verapamil (VERELAN PM) 180 MG 24 hr capsule   Other Relevant Orders   Comprehensive metabolic panel   Elevated transaminase level   Relevant Orders   Comprehensive metabolic panel   Gamma GT   Hyperlipidemia - Primary   Relevant Medications   verapamil (VERELAN PM) 180 MG 24 hr capsule   Other Relevant Orders   Lipid Panel w/o Chol/HDL Ratio    Other Visit Diagnoses    Other fatigue        New over the last month, especially following a hot flash.   Check labs    Relevant Orders    CBC with Differential/Platelet    TSH    VITAMIN D 25 Hydroxy (Vit-D Deficiency, Fractures)        Follow up plan: Return in about 6 months (around 07/16/2016). And with lab results

## 2016-01-15 LAB — LIPID PANEL W/O CHOL/HDL RATIO
Cholesterol, Total: 165 mg/dL (ref 100–199)
HDL: 51 mg/dL (ref >39)
LDL CALC: 90 mg/dL (ref 0–99)
TRIGLYCERIDES: 118 mg/dL (ref 0–149)
VLDL Cholesterol Cal: 24 mg/dL (ref 5–40)

## 2016-01-15 LAB — COMPREHENSIVE METABOLIC PANEL
ALT: 85 IU/L — AB (ref 0–32)
AST: 61 IU/L — AB (ref 0–40)
Albumin/Globulin Ratio: 1.7 (ref 1.2–2.2)
Albumin: 4.4 g/dL (ref 3.5–5.5)
Alkaline Phosphatase: 76 IU/L (ref 39–117)
BUN/Creatinine Ratio: 12 (ref 9–23)
BUN: 9 mg/dL (ref 6–24)
Bilirubin Total: 0.6 mg/dL (ref 0.0–1.2)
CALCIUM: 9 mg/dL (ref 8.7–10.2)
CO2: 23 mmol/L (ref 18–29)
CREATININE: 0.75 mg/dL (ref 0.57–1.00)
Chloride: 103 mmol/L (ref 96–106)
GFR calc Af Amer: 103 mL/min/{1.73_m2} (ref >59)
GFR, EST NON AFRICAN AMERICAN: 89 mL/min/{1.73_m2} (ref >59)
Globulin, Total: 2.6 g/dL (ref 1.5–4.5)
Glucose: 87 mg/dL (ref 65–99)
Potassium: 3.8 mmol/L (ref 3.5–5.2)
Sodium: 142 mmol/L (ref 134–144)
Total Protein: 7 g/dL (ref 6.0–8.5)

## 2016-01-15 LAB — CBC WITH DIFFERENTIAL/PLATELET
BASOS ABS: 0 10*3/uL (ref 0.0–0.2)
Basos: 1 %
EOS (ABSOLUTE): 0.1 10*3/uL (ref 0.0–0.4)
Eos: 2 %
HEMATOCRIT: 40.6 % (ref 34.0–46.6)
HEMOGLOBIN: 13.4 g/dL (ref 11.1–15.9)
Immature Grans (Abs): 0 10*3/uL (ref 0.0–0.1)
Immature Granulocytes: 0 %
LYMPHS ABS: 1.7 10*3/uL (ref 0.7–3.1)
Lymphs: 39 %
MCH: 29.2 pg (ref 26.6–33.0)
MCHC: 33 g/dL (ref 31.5–35.7)
MCV: 89 fL (ref 79–97)
MONOCYTES: 7 %
Monocytes Absolute: 0.3 10*3/uL (ref 0.1–0.9)
NEUTROS ABS: 2.3 10*3/uL (ref 1.4–7.0)
Neutrophils: 51 %
Platelets: 278 10*3/uL (ref 150–379)
RBC: 4.59 x10E6/uL (ref 3.77–5.28)
RDW: 14.4 % (ref 12.3–15.4)
WBC: 4.4 10*3/uL (ref 3.4–10.8)

## 2016-01-15 LAB — TSH: TSH: 1.17 u[IU]/mL (ref 0.450–4.500)

## 2016-01-15 LAB — VITAMIN D 25 HYDROXY (VIT D DEFICIENCY, FRACTURES): Vit D, 25-Hydroxy: 35.6 ng/mL (ref 30.0–100.0)

## 2016-01-15 LAB — GAMMA GT: GGT: 28 IU/L (ref 0–60)

## 2016-01-17 ENCOUNTER — Encounter: Payer: Self-pay | Admitting: Unknown Physician Specialty

## 2016-01-17 ENCOUNTER — Other Ambulatory Visit: Payer: Self-pay | Admitting: Unknown Physician Specialty

## 2016-01-17 DIAGNOSIS — R0602 Shortness of breath: Secondary | ICD-10-CM

## 2016-01-17 DIAGNOSIS — R5383 Other fatigue: Secondary | ICD-10-CM

## 2016-01-17 NOTE — Progress Notes (Signed)
Quick Note:  Notified pt by mychart ______

## 2016-02-09 ENCOUNTER — Ambulatory Visit: Payer: 59 | Admitting: Cardiology

## 2016-03-01 NOTE — Progress Notes (Signed)
Cardiology Office Note   Date:  03/02/2016   ID:  Iverna, Fenrich 1960-11-08, MRN [id number]  Referring Doctor:  Kathrine Haddock, NP   Cardiologist:   Wende Bushy, MD   Reason for consultation:  Chief Complaint  Patient presents with  . New Patient (Initial Visit)    Fatigue      History of Present Illness: Linda Boyle is a 55 y.o. female who presents for Fatigue. Basically she wants to make sure that her heart is healthy and one needed to see a cardiologist for that.  Lately, for several months now, she feels that she does not have the same energy level and gets tired more easily.  she may have some shortness of breath, mild intensity, on and off, not persistent. With exertion, resolved with rest. However, she is able to take multiple flights of stairs at work with little difficulty.   No chest pain no palpitations no headache, fever, cough, colds, abdominal pain. No PND, orthopnea.   ROS:  Please see the history of present illness. Aside from mentioned under HPI, all other systems are reviewed and negative.     Past Medical History:  Diagnosis Date  . Allergy   . GERD (gastroesophageal reflux disease)   . Hyperlipidemia   . Hypertension   . Mitral valve prolapse   . Neck sprain   . Vertigo     Past Surgical History:  Procedure Laterality Date  . [place] OF UTERUS  [id number]  . TOTAL VAGINAL HYSTERECTOMY  1994   Ovaries remain  . TUBAL LIGATION       reports that she has never smoked. She has never used smokeless tobacco. She reports that she does not drink alcohol or use drugs.   family history includes Alzheimer's disease in her father; Asthma in her brother; Diabetes in her brother, mother, and sister; Hypertension in her brother, father, and sister.   Outpatient Medications Prior to Visit  Medication Sig Dispense Refill  . atorvastatin (LIPITOR) 20 MG tablet Take 1 tablet (20 mg total) by mouth daily. 90 tablet 3  .  Cholecalciferol (VITAMIN D-3) 1000 UNITS CAPS Take 1,000 Units by mouth daily.    . fexofenadine (ALLEGRA) 180 MG tablet Take 1 tablet (180 mg total) by mouth daily as needed for allergies or rhinitis. 90 tablet 1  . fluticasone (FLONASE) 50 MCG/ACT nasal spray Place 2 sprays into both nostrils daily. 16 g 12  . loratadine (CLARITIN) 10 MG tablet Take 10 mg by mouth daily.    Marland Kitchen omeprazole (PRILOSEC) 20 MG capsule Take 1 capsule (20 mg total) by mouth daily. 90 capsule 1  . verapamil (VERELAN PM) 180 MG 24 hr capsule Take 1 capsule (180 mg total) by mouth at bedtime. 90 capsule 1  . estradiol (MINIVELLE) 0.05 MG/24HR patch Place 1 patch (0.05 mg total) onto the skin 2 (two) times a week. (Patient not taking: Reported on 01/14/2016) 8 patch 12   No facility-administered medications prior to visit.      Allergies: Review of patient's allergies indicates no known allergies.    PHYSICAL EXAM: VS:  BP 130/90 (BP Location: Left Arm, Patient Position: Sitting, Cuff Size: Normal)   Pulse 75   Ht 5\' 1"  (1.549 m)   Wt 156 lb 12.8 oz (71.1 kg)   LMP 10/14/1992 (Approximate)   BMI 29.63 kg/m  , Body mass index is 29.63 kg/m. Wt Readings from Last 3 Encounters:  03/02/16 156 lb 12.8 oz (  71.1 kg)  01/14/16 158 lb 3.2 oz (71.8 kg)  12/30/15 158 lb 1.6 oz (71.7 kg)    GENERAL:  well developed, well nourished, Overweight, not in acute distress HEENT: normocephalic, pink conjunctivae, anicteric sclerae, no xanthelasma, normal dentition, oropharynx clear NECK:  no neck vein engorgement, JVP normal, no hepatojugular reflux, carotid upstroke brisk and symmetric, no bruit, no thyromegaly, no lymphadenopathy LUNGS:  good respiratory effort, clear to auscultation bilaterally CV:  PMI not displaced, no thrills, no lifts, S1 and S2 within normal limits, no palpable S3 or S4, no murmurs, no rubs, no gallops ABD:  Soft, nontender, nondistended, normoactive bowel sounds, no abdominal aortic bruit, no hepatomegaly,  no splenomegaly MS: nontender back, no kyphosis, no scoliosis, no joint deformities EXT:  2+ DP/PT pulses, no edema, no varicosities, no cyanosis, no clubbing SKIN: warm, nondiaphoretic, normal turgor, no ulcers NEUROPSYCH: alert, oriented to person, place, and time, sensory/motor grossly intact, normal mood, appropriate affect  Recent Labs: 01/14/2016: ALT 85; BUN 9; Creatinine, Ser 0.75; Platelets 278; Potassium 3.8; Sodium 142; TSH 1.170   Lipid Panel    Component Value Date/Time   CHOL 165 01/14/2016 1003   TRIG 118 01/14/2016 1003   HDL 51 01/14/2016 1003   LDLCALC 90 01/14/2016 1003     Other studies Reviewed:  EKG:  The ekg from 03/02/2016 was personally reviewed by me and it revealed sinus rhythm, 75 BPM, nonspecific ST-T wave changes.  Additional studies/ records that were reviewed personally reviewed by me today include: None available   ASSESSMENT AND PLAN: Fatigue Possible shortness of breath For patient reassurance, recommend stress echocardiogram Recommend echocardiogram. If tests are abnormal, we will bring her back in to go over results. Otherwise, we will inform her of the results when they come out.  Hypertension BP is well controlled. Continue monitoring BP. Continue current medical therapy and lifestyle changes.  Current medicines are reviewed at length with the patient today.  The patient does not have concerns regarding medicines.  Labs/ tests ordered today include:  Orders Placed This Encounter  Procedures  . EKG 12-Lead  . ECHOCARDIOGRAM STRESS TEST  . ECHOCARDIOGRAM COMPLETE    I had a lengthy and detailed discussion with the patient regarding diagnoses, prognosis, diagnostic options, treatment options.   I counseled the patient on importance of lifestyle modification including heart healthy diet, regular physical activity.   Disposition:   FU with undersigned after tests prn   Signed, Wende Bushy, MD  03/02/2016 4:17 PM    [place]  This note was generated in part with voice recognition software and I apologize for any typographical errors that were not detected and corrected.

## 2016-03-02 ENCOUNTER — Encounter: Payer: Self-pay | Admitting: Cardiology

## 2016-03-02 ENCOUNTER — Ambulatory Visit (INDEPENDENT_AMBULATORY_CARE_PROVIDER_SITE_OTHER): Payer: 59 | Admitting: Cardiology

## 2016-03-02 VITALS — BP 130/90 | HR 75 | Ht 61.0 in | Wt 156.8 lb

## 2016-03-02 DIAGNOSIS — R9431 Abnormal electrocardiogram [ECG] [EKG]: Secondary | ICD-10-CM | POA: Diagnosis not present

## 2016-03-02 DIAGNOSIS — R0602 Shortness of breath: Secondary | ICD-10-CM

## 2016-03-02 DIAGNOSIS — I10 Essential (primary) hypertension: Secondary | ICD-10-CM | POA: Diagnosis not present

## 2016-03-02 DIAGNOSIS — R5383 Other fatigue: Secondary | ICD-10-CM | POA: Diagnosis not present

## 2016-03-02 NOTE — Patient Instructions (Addendum)
Medication Instructions:  Your physician recommends that you continue on your current medications as directed. Please refer to the Current Medication list given to you today.   Labwork: none  Testing/Procedures: Your physician has requested that you have an echocardiogram. Echocardiography is a painless test that uses sound waves to create images of your heart. It provides your doctor with information about the size and shape of your heart and how well your heart's chambers and valves are working. This procedure takes approximately one hour. There are no restrictions for this procedure.  Your physician has requested that you have a stress echocardiogram. For further information please visit [email or web address]. Please follow instruction sheet as given.    Follow-Up: Your physician recommends that you schedule a follow-up appointment as needed.    Any Other Special Instructions Will Be Listed Below (If Applicable).     If you need a refill on your cardiac medications before your next appointment, please call your pharmacy.  Echocardiogram An echocardiogram, or echocardiography, uses sound waves (ultrasound) to produce an image of your heart. The echocardiogram is simple, painless, obtained within a short period of time, and offers valuable information to your health care provider. The images from an echocardiogram can provide information such as:  Evidence of coronary artery disease (CAD).  Heart size.  Heart muscle function.  Heart valve function.  Aneurysm detection.  Evidence of a past heart attack.  Fluid buildup around the heart.  Heart muscle thickening.  Assess heart valve function. LET [facility] CARE PROVIDER KNOW ABOUT:  Any allergies you have.  All medicines you are taking, including vitamins, herbs, eye drops, creams, and over-the-counter medicines.  Previous problems you or members of your family have had with the use of anesthetics.  Any blood  disorders you have.  Previous surgeries you have had.  Medical conditions you have.  Possibility of pregnancy, if this applies. BEFORE THE PROCEDURE  No special preparation is needed. Eat and drink normally.  PROCEDURE   In order to produce an image of your heart, gel will be applied to your chest and a wand-like tool (transducer) will be moved over your chest. The gel will help transmit the sound waves from the transducer. The sound waves will harmlessly bounce off your heart to allow the heart images to be captured in real-time motion. These images will then be recorded.  You may need an IV to receive a medicine that improves the quality of the pictures. AFTER THE PROCEDURE You may return to your normal schedule including diet, activities, and medicines, unless your health care provider tells you otherwise.   This information is not intended to replace advice given to you by your health care provider. Make sure you discuss any questions you have with your health care provider.   Document Released: 06/23/2000 Document Revised: 07/17/2014 Document Reviewed: 03/03/2013 Elsevier Interactive Patient Education 2016 [employer]. Exercise Stress Echocardiogram An exercise stress echocardiogram is a heart (cardiac) test used to check the function of your heart. This test may also be called an exercise stress echocardiography or stress echo. This stress test will check how well your heart muscle and valves are working and determine if your heart muscle is getting enough blood. You will exercise on a treadmill to naturally increase or stress the functioning of your heart.  An echocardiogram uses sound waves (ultrasound) to produce an image of your heart. If your heart does not work normally, it may indicate coronary artery disease with poor coronary blood supply. The  coronary arteries are the arteries that bring blood and oxygen to your heart. LET [facility] CARE PROVIDER KNOW ABOUT:  Any  allergies you have.  All medicines you are taking, including vitamins, herbs, eye drops, creams, and over-the-counter medicines.  Previous problems you or members of your family have had with the use of anesthetics.  Any blood disorders you have.  Previous surgeries you have had.  Medical conditions you have.  Possibility of pregnancy, if this applies. RISKS AND COMPLICATIONS Generally, this is a safe procedure. However, as with any procedure, complications can occur. Possible complications can include:  You develop pain or pressure in the following areas:  Chest.  Jaw or neck.  Between your shoulder blades.  Radiating down your left arm.  Dizziness or lightheadedness.  Shortness of breath.  Increased or irregular heartbeat.  Nausea or vomiting.  Heart attack (rare). BEFORE THE PROCEDURE  Avoid all forms of caffeine for 24 hours before your test or as directed by your health care provider. This includes coffee, tea (even decaffeinated tea), caffeinated sodas, chocolate, cocoa, and certain pain medicines.  Follow your health care provider's instructions regarding eating and drinking before the test.  Take your medicines as directed at regular times with water unless instructed otherwise. Exceptions may include:  If you have diabetes, ask how you are to take your insulin or pills. It is common to adjust insulin dosing the morning of the test.  If you are taking beta-blocker medicines, it is important to talk to your health care provider about these medicines well before the date of your test. Taking beta-blocker medicines may interfere with the test. In some cases, these medicines need to be changed or stopped 24 hours or more before the test.  If you wear a nitroglycerin patch, it may need to be removed prior to the test. Ask your health care provider if the patch should be removed before the test.  If you use an inhaler for any breathing condition, bring it with you  to the test.  If you are an outpatient, bring a snack so you can eat right after the stress phase of the test.  Do not smoke for 4 hours prior to the test or as directed by your health care provider.  Wear loose-fitting clothes and comfortable shoes for the test. This test involves walking on a treadmill. PROCEDURE   Multiple electrodes will be put on your chest. If needed, small areas of your chest may be shaved to get better contact with the electrodes. Once the electrodes are attached to your body, multiple wires will be attached to the electrodes, and your heart rate will be monitored.  You will have an echocardiogram done at rest.  To produce this image of your heart, gel is applied to your chest, and a wand-like tool (transducer) is moved over the chest. The transducer sends the sound waves through the chest to create the moving images of your heart.  You may need an IV to receive a medication that improves the quality of the pictures.  You will then walk on a treadmill. The treadmill will be started at a slow pace. The treadmill speed and incline will gradually be increased to raise your heart rate.  At the peak of exercise, the treadmill will be stopped. You will lie down immediately on a bed so that a second echocardiogram can be done to visualize your heart's motion with exercise.  The test usually takes 30-60 minutes to complete. AFTER THE PROCEDURE  Your heart rate and blood pressure will be monitored after the test.  You may return to your normal schedule, including diet, activities, and medicines, unless your health care provider tells you otherwise.   This information is not intended to replace advice given to you by your health care provider. Make sure you discuss any questions you have with your health care provider.   Document Released: 06/30/2004 Document Revised: 07/01/2013 Document Reviewed: 03/03/2013 Elsevier Interactive Patient Education [employer].

## 2016-03-23 DIAGNOSIS — H524 Presbyopia: Secondary | ICD-10-CM | POA: Diagnosis not present

## 2016-03-30 ENCOUNTER — Other Ambulatory Visit: Payer: Self-pay

## 2016-03-30 ENCOUNTER — Ambulatory Visit (INDEPENDENT_AMBULATORY_CARE_PROVIDER_SITE_OTHER): Payer: 59

## 2016-03-30 DIAGNOSIS — R9431 Abnormal electrocardiogram [ECG] [EKG]: Secondary | ICD-10-CM

## 2016-03-30 LAB — ECHOCARDIOGRAM STRESS TEST
CSEPED: 9 min
CSEPEDS: 0 s
CSEPHR: 90 %
CSEPPHR: 148 {beats}/min
Estimated workload: 10.2 METS
MPHR: 164 {beats}/min
Rest HR: 82 {beats}/min

## 2016-05-15 DIAGNOSIS — J069 Acute upper respiratory infection, unspecified: Secondary | ICD-10-CM | POA: Diagnosis not present

## 2016-07-21 ENCOUNTER — Encounter: Payer: 59 | Admitting: Unknown Physician Specialty

## 2016-07-24 ENCOUNTER — Ambulatory Visit (INDEPENDENT_AMBULATORY_CARE_PROVIDER_SITE_OTHER): Payer: 59 | Admitting: Unknown Physician Specialty

## 2016-07-24 ENCOUNTER — Encounter: Payer: Self-pay | Admitting: Unknown Physician Specialty

## 2016-07-24 VITALS — BP 137/89 | HR 67 | Temp 98.0°F | Ht 61.0 in | Wt 157.2 lb

## 2016-07-24 DIAGNOSIS — Z0000 Encounter for general adult medical examination without abnormal findings: Secondary | ICD-10-CM | POA: Diagnosis not present

## 2016-07-24 DIAGNOSIS — E7800 Pure hypercholesterolemia, unspecified: Secondary | ICD-10-CM | POA: Diagnosis not present

## 2016-07-24 DIAGNOSIS — Z23 Encounter for immunization: Secondary | ICD-10-CM

## 2016-07-24 DIAGNOSIS — I10 Essential (primary) hypertension: Secondary | ICD-10-CM | POA: Diagnosis not present

## 2016-07-24 MED ORDER — VERAPAMIL HCL ER 180 MG PO CP24: 180.0000 mg | ORAL_CAPSULE | Freq: Every day | ORAL | 1 refills | Status: DC

## 2016-07-24 MED ORDER — OMEPRAZOLE 20 MG PO CPDR: 20.0000 mg | DELAYED_RELEASE_CAPSULE | Freq: Every day | ORAL | 1 refills | Status: DC

## 2016-07-24 MED ORDER — ATORVASTATIN CALCIUM 20 MG PO TABS: 20.0000 mg | ORAL_TABLET | Freq: Every day | ORAL | 3 refills | Status: DC

## 2016-07-24 NOTE — Assessment & Plan Note (Signed)
Stable, continue present medications.   

## 2016-07-24 NOTE — Assessment & Plan Note (Addendum)
Stable, continue present medications. Discussed DASH diet with a goal BP of less than 130/80

## 2016-07-24 NOTE — Patient Instructions (Addendum)
Tdap Vaccine (Tetanus, Diphtheria and Pertussis): What You Need to Know 1. Why get vaccinated? Tetanus, diphtheria and pertussis are very serious diseases. Tdap vaccine can protect us from these diseases. And, Tdap vaccine given to pregnant women can protect newborn babies against pertussis. TETANUS (Lockjaw) is rare in the [place] today. It causes painful muscle tightening and stiffness, usually all over the body.  It can lead to tightening of muscles in the head and neck so you can't open your mouth, swallow, or sometimes even breathe. Tetanus kills about 1 out of 10 people who are infected even after receiving the best medical care.  DIPHTHERIA is also rare in the [place] today. It can cause a thick coating to form in the back of the throat.  It can lead to breathing problems, heart failure, paralysis, and death.  PERTUSSIS (Whooping Cough) causes severe coughing spells, which can cause difficulty breathing, vomiting and disturbed sleep.  It can also lead to weight loss, incontinence, and rib fractures. Up to 2 in 100 adolescents and 5 in 100 adults with pertussis are hospitalized or have complications, which could include pneumonia or death.  These diseases are caused by bacteria. Diphtheria and pertussis are spread from person to person through secretions from coughing or sneezing. Tetanus enters the body through cuts, scratches, or wounds. Before vaccines, as many as 200,000 cases of diphtheria, 200,000 cases of pertussis, and hundreds of cases of tetanus, were reported in the [place] each year. Since vaccination began, reports of cases for tetanus and diphtheria have dropped by about 99% and for pertussis by about 80%. 2. Tdap vaccine Tdap vaccine can protect adolescents and adults from tetanus, diphtheria, and pertussis. One dose of Tdap is routinely given at age 11 or 12. People who did not get Tdap at that age should get it as soon as possible. Tdap is especially  important for healthcare professionals and anyone having close contact with a baby younger than 12 months. Pregnant women should get a dose of Tdap during every pregnancy, to protect the newborn from pertussis. Infants are most at risk for severe, life-threatening complications from pertussis. Another vaccine, called Td, protects against tetanus and diphtheria, but not pertussis. A Td booster should be given every 10 years. Tdap may be given as one of these boosters if you have never gotten Tdap before. Tdap may also be given after a severe cut or burn to prevent tetanus infection. Your doctor or the person giving you the vaccine can give you more information. Tdap may safely be given at the same time as other vaccines. 3. Some people should not get this vaccine  A person who has ever had a life-threatening allergic reaction after a previous dose of any diphtheria, tetanus or pertussis containing vaccine, OR has a severe allergy to any part of this vaccine, should not get Tdap vaccine. Tell the person giving the vaccine about any severe allergies.  Anyone who had coma or long repeated seizures within 7 days after a childhood dose of DTP or DTaP, or a previous dose of Tdap, should not get Tdap, unless a cause other than the vaccine was found. They can still get Td.  Talk to your doctor if you: ? have seizures or another nervous system problem, ? had severe pain or swelling after any vaccine containing diphtheria, tetanus or pertussis, ? ever had a condition called Guillain-Barr Syndrome (GBS), ? aren't feeling well on the day the shot is scheduled. 4. Risks With any medicine, including   vaccines, there is a chance of side effects. These are usually mild and go away on their own. Serious reactions are also possible but are rare. Most people who get Tdap vaccine do not have any problems with it. Mild problems following Tdap: (Did not interfere with activities)  Pain where the shot was given (about  3 in 4 adolescents or 2 in 3 adults)  Redness or swelling where the shot was given (about 1 person in 5)  Mild fever of at least 100.4F (up to about 1 in 25 adolescents or 1 in 100 adults)  Headache (about 3 or 4 people in 10)  Tiredness (about 1 person in 3 or 4)  Nausea, vomiting, diarrhea, stomach ache (up to 1 in 4 adolescents or 1 in 10 adults)  Chills, sore joints (about 1 person in 10)  Body aches (about 1 person in 3 or 4)  Rash, swollen glands (uncommon)  Moderate problems following Tdap: (Interfered with activities, but did not require medical attention)  Pain where the shot was given (up to 1 in 5 or 6)  Redness or swelling where the shot was given (up to about 1 in 16 adolescents or 1 in 12 adults)  Fever over 102F (about 1 in 100 adolescents or 1 in 250 adults)  Headache (about 1 in 7 adolescents or 1 in 10 adults)  Nausea, vomiting, diarrhea, stomach ache (up to 1 or 3 people in 100)  Swelling of the entire arm where the shot was given (up to about 1 in 500).  Severe problems following Tdap: (Unable to perform usual activities; required medical attention)  Swelling, severe pain, bleeding and redness in the arm where the shot was given (rare).  Problems that could happen after any vaccine:  People sometimes faint after a medical procedure, including vaccination. Sitting or lying down for about 15 minutes can help prevent fainting, and injuries caused by a fall. Tell your doctor if you feel dizzy, or have vision changes or ringing in the ears.  Some people get severe pain in the shoulder and have difficulty moving the arm where a shot was given. This happens very rarely.  Any medication can cause a severe allergic reaction. Such reactions from a vaccine are very rare, estimated at fewer than 1 in a million doses, and would happen within a few minutes to a few hours after the vaccination. As with any medicine, there is a very remote chance of a vaccine  causing a serious injury or death. The safety of vaccines is always being monitored. For more information, visit: www.cdc.gov/vaccinesafety/ 5. What if there is a serious problem? What should I look for? Look for anything that concerns you, such as signs of a severe allergic reaction, very high fever, or unusual behavior. Signs of a severe allergic reaction can include hives, swelling of the face and throat, difficulty breathing, a fast heartbeat, dizziness, and weakness. These would usually start a few minutes to a few hours after the vaccination. What should I do?  If you think it is a severe allergic reaction or other emergency that can't wait, call 9-1-1 or get the person to the nearest hospital. Otherwise, call your doctor.  Afterward, the reaction should be reported to the Vaccine Adverse Event Reporting System (VAERS). Your doctor might file this report, or you can do it yourself through the VAERS web site at www.vaers.hhs.gov, or by calling 1-800-822-7967. ? VAERS does not give medical advice. 6. The National Vaccine Injury Compensation Program The National   Vaccine Injury Compensation Program (VICP) is a federal program that was created to compensate people who may have been injured by certain vaccines. Persons who believe they may have been injured by a vaccine can learn about the program and about filing a claim by calling 1-800-338-2382 or visiting the VICP website at www.hrsa.gov/vaccinecompensation. There is a time limit to file a claim for compensation. 7. How can I learn more?  Ask your doctor. He or she can give you the vaccine package insert or suggest other sources of information.  Call your local or state health department.  Contact the Centers for Disease Control and Prevention (CDC): ? Call 1-800-232-4636 (1-800-CDC-INFO) or ? Visit CDC's website at www.cdc.gov/vaccines CDC Tdap Vaccine VIS (09/02/13) This information is not intended to replace advice given to you by your  health care provider. Make sure you discuss any questions you have with your health care provider. Document Released: 12/26/2011 Document Revised: 03/16/2016 Document Reviewed: 03/16/2016 Elsevier Interactive Patient Education  2017 Elsevier Inc.  

## 2016-07-24 NOTE — Progress Notes (Signed)
BP 137/89 (BP Location: Left Arm, Cuff Size: Large)   Pulse 67   Temp 98 F (36.7 C)   Ht 5\' 1"  (1.549 m)   Wt 157 lb 3.2 oz (71.3 kg)   LMP 10/14/1992 (Approximate)   SpO2 98%   BMI 29.70 kg/m    Subjective:    Patient ID: Linda Boyle, female    DOB: Apr 10, 1961, 56 y.o.   MRN: [id number]  HPI: Linda Boyle is a 56 y.o. female  Chief Complaint  Patient presents with  . Annual Exam   Hypertension Using medications without difficulty Average home BPsNot Checking but OK at work.    No problems or lightheadedness No chest pain with exertion or shortness of breath No Edema  Hyperlipidemia Using medications without problems: No Muscle aches  Diet compliance: Eats healthy Exercise: Walking a lot at work  Depression screen [facility] 2/9 07/24/2016  Decreased Interest 0  Down, Depressed, Hopeless 0  PHQ - 2 Score 0   Family History  Problem Relation Age of Onset  . Diabetes Mother   . Alzheimer's disease Father   . Hypertension Father   . Diabetes Sister   . Hypertension Brother   . Asthma Brother   . Diabetes Brother   . Hypertension Sister    Social History   Social History  . Marital status: Married    Spouse name: N/A  . Number of children: N/A  . Years of education: N/A   Occupational History  . Not on file.   Social History Main Topics  . Smoking status: Never Smoker  . Smokeless tobacco: Never Used  . Alcohol use No  . Drug use: No  . Sexual activity: Not Currently   Other Topics Concern  . Not on file   Social History Narrative  . No narrative on file   Past Medical History:  Diagnosis Date  . Allergy   . GERD (gastroesophageal reflux disease)   . Hyperlipidemia   . Hypertension   . Mitral valve prolapse   . Neck sprain   . Vertigo    Past Surgical History:  Procedure Laterality Date  . [place] OF UTERUS  [id number]  . TOTAL VAGINAL HYSTERECTOMY  1994   Ovaries remain  . TUBAL LIGATION     Relevant past  medical, surgical, family and social history reviewed and updated as indicated. Interim medical history since our last visit reviewed. Allergies and medications reviewed and updated.  Review of Systems  Constitutional: Negative.   HENT: Negative.   Eyes: Negative.   Respiratory: Negative.   Cardiovascular: Negative.   Gastrointestinal:       Noted decrease appetite  Endocrine: Negative.   Genitourinary: Negative.   Musculoskeletal: Negative.   Skin: Negative.   Allergic/Immunologic: Negative.   Neurological: Negative.   Hematological: Negative.   Psychiatric/Behavioral: Negative.     Per HPI unless specifically indicated above     Objective:    BP 137/89 (BP Location: Left Arm, Cuff Size: Large)   Pulse 67   Temp 98 F (36.7 C)   Ht 5\' 1"  (1.549 m)   Wt 157 lb 3.2 oz (71.3 kg)   LMP 10/14/1992 (Approximate)   SpO2 98%   BMI 29.70 kg/m   Wt Readings from Last 3 Encounters:  07/24/16 157 lb 3.2 oz (71.3 kg)  03/02/16 156 lb 12.8 oz (71.1 kg)  01/14/16 158 lb 3.2 oz (71.8 kg)    Physical Exam  Constitutional: She is oriented to  person, place, and time. She appears well-developed and well-nourished.  HENT:  Head: Normocephalic and atraumatic.  Eyes: Pupils are equal, round, and reactive to light. Right eye exhibits no discharge. Left eye exhibits no discharge. No scleral icterus.  Neck: Normal range of motion. Neck supple. Carotid bruit is not present. No thyromegaly present.  Cardiovascular: Normal rate, regular rhythm and normal heart sounds.  Exam reveals no gallop and no friction rub.   No murmur heard. Pulmonary/Chest: Effort normal and breath sounds normal. No respiratory distress. She has no wheezes. She has no rales.  Abdominal: Soft. Bowel sounds are normal. There is no tenderness. There is no rebound.  Genitourinary: No breast swelling, tenderness or discharge.  Musculoskeletal: Normal range of motion.  Lymphadenopathy:    She has no cervical adenopathy.    Neurological: She is alert and oriented to person, place, and time.  Skin: Skin is warm, dry and intact. No rash noted.  Psychiatric: She has a normal mood and affect. Her speech is normal and behavior is normal. Judgment and thought content normal. Cognition and memory are normal.      Assessment & Plan:   Problem List Items Addressed This Visit      Unprioritized   Benign hypertension    Stable, continue present medications. Discussed DASH diet with a goal BP of less than 130/80       Relevant Medications   atorvastatin (LIPITOR) 20 MG tablet   verapamil (VERELAN PM) 180 MG 24 hr capsule   Other Relevant Orders   Comprehensive metabolic panel   Hyperlipidemia    Stable, continue present medications.        Relevant Medications   atorvastatin (LIPITOR) 20 MG tablet   verapamil (VERELAN PM) 180 MG 24 hr capsule   Other Relevant Orders   Lipid Panel w/o Chol/HDL Ratio    Other Visit Diagnoses    Need for diphtheria-tetanus-pertussis (Tdap) vaccine, adult/adolescent    -  Primary   Relevant Orders   Tdap vaccine greater than or equal to 7yo IM (Completed)   Annual physical exam       Relevant Orders   Lipid Panel w/o Chol/HDL Ratio   TSH   CBC with Differential/Platelet       Follow up plan: Return in about 6 months (around 01/21/2017).

## 2016-07-25 LAB — CBC WITH DIFFERENTIAL/PLATELET
BASOS ABS: 0 10*3/uL (ref 0.0–0.2)
Basos: 1 %
EOS (ABSOLUTE): 0.1 10*3/uL (ref 0.0–0.4)
Eos: 3 %
HEMATOCRIT: 39.4 % (ref 34.0–46.6)
HEMOGLOBIN: 12.6 g/dL (ref 11.1–15.9)
Immature Grans (Abs): 0 10*3/uL (ref 0.0–0.1)
Immature Granulocytes: 0 %
LYMPHS ABS: 2.2 10*3/uL (ref 0.7–3.1)
Lymphs: 51 %
MCH: 28.1 pg (ref 26.6–33.0)
MCHC: 32 g/dL (ref 31.5–35.7)
MCV: 88 fL (ref 79–97)
MONOCYTES: 7 %
Monocytes Absolute: 0.3 10*3/uL (ref 0.1–0.9)
NEUTROS ABS: 1.7 10*3/uL (ref 1.4–7.0)
Neutrophils: 38 %
Platelets: 265 10*3/uL (ref 150–379)
RBC: 4.48 x10E6/uL (ref 3.77–5.28)
RDW: 14.2 % (ref 12.3–15.4)
WBC: 4.4 10*3/uL (ref 3.4–10.8)

## 2016-07-25 LAB — COMPREHENSIVE METABOLIC PANEL
A/G RATIO: 1.4 (ref 1.2–2.2)
ALT: 78 IU/L — AB (ref 0–32)
AST: 59 IU/L — AB (ref 0–40)
Albumin: 4 g/dL (ref 3.5–5.5)
Alkaline Phosphatase: 68 IU/L (ref 39–117)
BUN/Creatinine Ratio: 13 (ref 9–23)
BUN: 11 mg/dL (ref 6–24)
Bilirubin Total: 0.4 mg/dL (ref 0.0–1.2)
CALCIUM: 8.9 mg/dL (ref 8.7–10.2)
CO2: 25 mmol/L (ref 18–29)
CREATININE: 0.85 mg/dL (ref 0.57–1.00)
Chloride: 102 mmol/L (ref 96–106)
GFR calc Af Amer: 89 mL/min/{1.73_m2} (ref >59)
GFR, EST NON AFRICAN AMERICAN: 77 mL/min/{1.73_m2} (ref >59)
Globulin, Total: 2.9 g/dL (ref 1.5–4.5)
Glucose: 93 mg/dL (ref 65–99)
POTASSIUM: 3.5 mmol/L (ref 3.5–5.2)
Sodium: 141 mmol/L (ref 134–144)
Total Protein: 6.9 g/dL (ref 6.0–8.5)

## 2016-07-25 LAB — LIPID PANEL W/O CHOL/HDL RATIO
Cholesterol, Total: 162 mg/dL (ref 100–199)
HDL: 45 mg/dL (ref >39)
LDL Calculated: 97 mg/dL (ref 0–99)
Triglycerides: 101 mg/dL (ref 0–149)
VLDL Cholesterol Cal: 20 mg/dL (ref 5–40)

## 2016-07-25 LAB — TSH: TSH: 1.28 u[IU]/mL (ref 0.450–4.500)

## 2016-08-18 ENCOUNTER — Other Ambulatory Visit: Payer: Self-pay | Admitting: Unknown Physician Specialty

## 2016-08-22 ENCOUNTER — Other Ambulatory Visit: Payer: Self-pay | Admitting: Unknown Physician Specialty

## 2016-08-22 DIAGNOSIS — Z1231 Encounter for screening mammogram for malignant neoplasm of breast: Secondary | ICD-10-CM

## 2016-09-11 ENCOUNTER — Other Ambulatory Visit: Payer: Self-pay | Admitting: Unknown Physician Specialty

## 2016-09-11 ENCOUNTER — Ambulatory Visit
Admission: RE | Admit: 2016-09-11 | Discharge: 2016-09-11 | Disposition: A | Discharge status: Home / Self Care | Payer: 59 | Source: Ambulatory Visit | Attending: Unknown Physician Specialty | Admitting: Unknown Physician Specialty

## 2016-09-11 DIAGNOSIS — Z1231 Encounter for screening mammogram for malignant neoplasm of breast: Secondary | ICD-10-CM | POA: Insufficient documentation

## 2016-12-19 DIAGNOSIS — L03011 Cellulitis of right finger: Secondary | ICD-10-CM | POA: Diagnosis not present

## 2016-12-22 DIAGNOSIS — L03011 Cellulitis of right finger: Secondary | ICD-10-CM | POA: Diagnosis not present

## 2017-01-26 ENCOUNTER — Encounter: Payer: Self-pay | Admitting: Unknown Physician Specialty

## 2017-01-26 ENCOUNTER — Ambulatory Visit (INDEPENDENT_AMBULATORY_CARE_PROVIDER_SITE_OTHER): Payer: 59 | Admitting: Unknown Physician Specialty

## 2017-01-26 VITALS — BP 132/84 | HR 76 | Temp 98.2°F | Ht 61.7 in | Wt 157.7 lb

## 2017-01-26 DIAGNOSIS — R740 Nonspecific elevation of levels of transaminase and lactic acid dehydrogenase [LDH]: Secondary | ICD-10-CM

## 2017-01-26 DIAGNOSIS — I10 Essential (primary) hypertension: Secondary | ICD-10-CM

## 2017-01-26 DIAGNOSIS — N644 Mastodynia: Secondary | ICD-10-CM

## 2017-01-26 DIAGNOSIS — K219 Gastro-esophageal reflux disease without esophagitis: Secondary | ICD-10-CM | POA: Diagnosis not present

## 2017-01-26 DIAGNOSIS — N952 Postmenopausal atrophic vaginitis: Secondary | ICD-10-CM | POA: Diagnosis not present

## 2017-01-26 DIAGNOSIS — R7401 Elevation of levels of liver transaminase levels: Secondary | ICD-10-CM

## 2017-01-26 DIAGNOSIS — E7800 Pure hypercholesterolemia, unspecified: Secondary | ICD-10-CM | POA: Diagnosis not present

## 2017-01-26 MED ORDER — OMEPRAZOLE 20 MG PO CPDR: 20.0000 mg | DELAYED_RELEASE_CAPSULE | Freq: Every day | ORAL | 1 refills | Status: DC

## 2017-01-26 MED ORDER — VERAPAMIL HCL ER 180 MG PO CP24: 180.0000 mg | ORAL_CAPSULE | Freq: Every day | ORAL | 1 refills | Status: DC

## 2017-01-26 NOTE — Progress Notes (Signed)
BP 132/84   Pulse 76   Temp 98.2 F (36.8 C)   Ht 5' 1.7" (1.567 m)   Wt 157 lb 11.2 oz (71.5 kg)   LMP 10/14/1992 (Approximate)   SpO2 97%   BMI 29.12 kg/m    Subjective:    Patient ID: Linda Boyle, female    DOB: [birth year], 56 y.o.   MRN: [id number]  HPI: Linda Boyle is a 56 y.o. female  Chief Complaint  Patient presents with  . Hyperlipidemia  . Hypertension   Hypertension Using medications without difficulty Average home BPs   No problems or lightheadedness No chest pain with exertion or shortness of breath No Edema: see below for ankles  GERD Pt states her reflux is controlled as long as she watches what she eats.    Relevant past medical, surgical, family and social history reviewed and updated as indicated. Interim medical history since our last visit reviewed. Allergies and medications reviewed and updated.  Review of Systems  Genitourinary:       Right breast soreness that is now gone.  U(p to date on mammograms.  Having some vaginal dryness  Musculoskeletal:       Noted indentions in ankles   Note: she has had an echocardiogram Per HPI unless specifically indicated above     Objective:    BP 132/84   Pulse 76   Temp 98.2 F (36.8 C)   Ht 5' 1.7" (1.567 m)   Wt 157 lb 11.2 oz (71.5 kg)   LMP 10/14/1992 (Approximate)   SpO2 97%   BMI 29.12 kg/m   Wt Readings from Last 3 Encounters:  01/26/17 157 lb 11.2 oz (71.5 kg)  07/24/16 157 lb 3.2 oz (71.3 kg)  03/02/16 156 lb 12.8 oz (71.1 kg)    Physical Exam  Constitutional: She is oriented to person, place, and time. She appears well-developed and well-nourished. No distress.  HENT:  Head: Normocephalic and atraumatic.  Eyes: Conjunctivae and lids are normal. Right eye exhibits no discharge. Left eye exhibits no discharge. No scleral icterus.  Neck: Normal range of motion. Neck supple. No JVD present. Carotid bruit is not present.  Cardiovascular: Normal rate, regular rhythm and  normal heart sounds.   Pulmonary/Chest: Effort normal and breath sounds normal.  Abdominal: Normal appearance. There is no splenomegaly or hepatomegaly.  Musculoskeletal: Normal range of motion.  Neurological: She is alert and oriented to person, place, and time.  Skin: Skin is warm, dry and intact. No rash noted. No pallor.  Psychiatric: She has a normal mood and affect. Her behavior is normal. Judgment and thought content normal.    Results for orders placed or performed in visit on 07/24/16  Comprehensive metabolic panel  Result Value Ref Range   Glucose 93 65 - 99 mg/dL   BUN 11 6 - 24 mg/dL   Creatinine, Ser 0.85 0.57 - 1.00 mg/dL   GFR calc non Af Amer 77 >59 mL/min/1.73   GFR calc Af Amer 89 >59 mL/min/1.73   BUN/Creatinine Ratio 13 9 - 23   Sodium 141 134 - 144 mmol/L   Potassium 3.5 3.5 - 5.2 mmol/L   Chloride 102 96 - 106 mmol/L   CO2 25 18 - 29 mmol/L   Calcium 8.9 8.7 - 10.2 mg/dL   Total Protein 6.9 6.0 - 8.5 g/dL   Albumin 4.0 3.5 - 5.5 g/dL   Globulin, Total 2.9 1.5 - 4.5 g/dL   Albumin/Globulin Ratio 1.4 1.2 - 2.2  Bilirubin Total 0.4 0.0 - 1.2 mg/dL   Alkaline Phosphatase 68 39 - 117 IU/L   AST 59 (H) 0 - 40 IU/L   ALT 78 (H) 0 - 32 IU/L  Lipid Panel w/o Chol/HDL Ratio  Result Value Ref Range   Cholesterol, Total 162 100 - 199 mg/dL   Triglycerides 101 0 - 149 mg/dL   HDL 45 >39 mg/dL   VLDL Cholesterol Cal 20 5 - 40 mg/dL   LDL Calculated 97 0 - 99 mg/dL  TSH  Result Value Ref Range   TSH 1.280 0.450 - 4.500 uIU/mL  CBC with Differential/Platelet  Result Value Ref Range   WBC 4.4 3.4 - 10.8 x10E3/uL   RBC 4.48 3.77 - 5.28 x10E6/uL   Hemoglobin 12.6 11.1 - 15.9 g/dL   Hematocrit 39.4 34.0 - 46.6 %   MCV 88 79 - 97 fL   MCH 28.1 26.6 - 33.0 pg   MCHC 32.0 31.5 - 35.7 g/dL   RDW 14.2 12.3 - 15.4 %   Platelets 265 150 - 379 x10E3/uL   Neutrophils 38 Not Estab. %   Lymphs 51 Not Estab. %   Monocytes 7 Not Estab. %   Eos 3 Not Estab. %   Basos 1  Not Estab. %   Neutrophils Absolute 1.7 1.4 - 7.0 x10E3/uL   Lymphocytes Absolute 2.2 0.7 - 3.1 x10E3/uL   Monocytes Absolute 0.3 0.1 - 0.9 x10E3/uL   EOS (ABSOLUTE) 0.1 0.0 - 0.4 x10E3/uL   Basophils Absolute 0.0 0.0 - 0.2 x10E3/uL   Immature Granulocytes 0 Not Estab. %   Immature Grans (Abs) 0.0 0.0 - 0.1 x10E3/uL      Assessment & Plan:   Problem List Items Addressed This Visit      Unprioritized   Atrophic vaginitis    New problems and examined by Dr. Marcelline Mates.  Recommended Replense product      Benign hypertension    Stable, continue present medications.        Relevant Medications   verapamil (VERELAN PM) 180 MG 24 hr capsule   Other Relevant Orders   Lipid Panel w/o Chol/HDL Ratio   Elevated transaminase level    Stable, but recheck today      Relevant Orders   Comprehensive metabolic panel   GERD (gastroesophageal reflux disease)    Stable, continue present medications.        Relevant Medications   omeprazole (PRILOSEC) 20 MG capsule   Hyperlipidemia    Check lipids today      Relevant Medications   verapamil (VERELAN PM) 180 MG 24 hr capsule    Other Visit Diagnoses    Soreness breast    -  Primary   Resolved now.  Up to date on mammograms.  Will follow if returns       Follow up plan: Return in about 6 months (around 07/29/2017) for for physical.

## 2017-01-26 NOTE — Assessment & Plan Note (Signed)
Check lipids today 

## 2017-01-26 NOTE — Assessment & Plan Note (Signed)
Stable, but recheck today

## 2017-01-26 NOTE — Assessment & Plan Note (Signed)
Stable, continue present medications.   

## 2017-01-26 NOTE — Assessment & Plan Note (Signed)
New problems and examined by Dr. Marcelline Mates.  Recommended Replense product

## 2017-01-27 LAB — COMPREHENSIVE METABOLIC PANEL
A/G RATIO: 1.5 (ref 1.2–2.2)
ALT: 72 IU/L — ABNORMAL HIGH (ref 0–32)
AST: 57 IU/L — ABNORMAL HIGH (ref 0–40)
Albumin: 4.3 g/dL (ref 3.5–5.5)
Alkaline Phosphatase: 71 IU/L (ref 39–117)
BUN / CREAT RATIO: 12 (ref 9–23)
BUN: 10 mg/dL (ref 6–24)
Bilirubin Total: 0.5 mg/dL (ref 0.0–1.2)
CALCIUM: 9.3 mg/dL (ref 8.7–10.2)
CO2: 27 mmol/L (ref 20–29)
CREATININE: 0.85 mg/dL (ref 0.57–1.00)
Chloride: 103 mmol/L (ref 96–106)
GFR, EST AFRICAN AMERICAN: 88 mL/min/{1.73_m2} (ref >59)
GFR, EST NON AFRICAN AMERICAN: 76 mL/min/{1.73_m2} (ref >59)
GLOBULIN, TOTAL: 2.8 g/dL (ref 1.5–4.5)
Glucose: 96 mg/dL (ref 65–99)
POTASSIUM: 3.6 mmol/L (ref 3.5–5.2)
SODIUM: 144 mmol/L (ref 134–144)
TOTAL PROTEIN: 7.1 g/dL (ref 6.0–8.5)

## 2017-01-27 LAB — LIPID PANEL W/O CHOL/HDL RATIO
Cholesterol, Total: 158 mg/dL (ref 100–199)
HDL: 50 mg/dL (ref >39)
LDL CALC: 89 mg/dL (ref 0–99)
Triglycerides: 96 mg/dL (ref 0–149)
VLDL Cholesterol Cal: 19 mg/dL (ref 5–40)

## 2017-01-29 ENCOUNTER — Encounter: Payer: Self-pay | Admitting: Unknown Physician Specialty

## 2017-05-22 DIAGNOSIS — H524 Presbyopia: Secondary | ICD-10-CM | POA: Diagnosis not present

## 2017-07-05 ENCOUNTER — Encounter: Payer: Self-pay | Admitting: Unknown Physician Specialty

## 2017-07-31 ENCOUNTER — Encounter: Payer: 59 | Admitting: Unknown Physician Specialty

## 2017-08-08 ENCOUNTER — Encounter: Payer: Self-pay | Admitting: Unknown Physician Specialty

## 2017-08-08 ENCOUNTER — Ambulatory Visit: Payer: 59 | Admitting: Unknown Physician Specialty

## 2017-08-08 VITALS — BP 149/86 | HR 70 | Temp 97.7°F | Ht 61.1 in | Wt 160.4 lb

## 2017-08-08 DIAGNOSIS — I10 Essential (primary) hypertension: Secondary | ICD-10-CM

## 2017-08-08 DIAGNOSIS — K219 Gastro-esophageal reflux disease without esophagitis: Secondary | ICD-10-CM | POA: Diagnosis not present

## 2017-08-08 DIAGNOSIS — M67912 Unspecified disorder of synovium and tendon, left shoulder: Secondary | ICD-10-CM | POA: Diagnosis not present

## 2017-08-08 DIAGNOSIS — Z0001 Encounter for general adult medical examination with abnormal findings: Secondary | ICD-10-CM | POA: Diagnosis not present

## 2017-08-08 DIAGNOSIS — M67911 Unspecified disorder of synovium and tendon, right shoulder: Secondary | ICD-10-CM

## 2017-08-08 DIAGNOSIS — Z0000 Encounter for general adult medical examination without abnormal findings: Secondary | ICD-10-CM

## 2017-08-08 DIAGNOSIS — E7800 Pure hypercholesterolemia, unspecified: Secondary | ICD-10-CM

## 2017-08-08 DIAGNOSIS — M67919 Unspecified disorder of synovium and tendon, unspecified shoulder: Secondary | ICD-10-CM | POA: Insufficient documentation

## 2017-08-08 DIAGNOSIS — R0902 Hypoxemia: Secondary | ICD-10-CM

## 2017-08-08 MED ORDER — VERAPAMIL HCL ER 180 MG PO TBCR: 180.0000 mg | EXTENDED_RELEASE_TABLET | Freq: Every day | ORAL | 1 refills | Status: DC

## 2017-08-08 MED ORDER — ATORVASTATIN CALCIUM 20 MG PO TABS: 20.0000 mg | ORAL_TABLET | Freq: Every day | ORAL | 3 refills | Status: DC

## 2017-08-08 NOTE — Assessment & Plan Note (Signed)
Check today 

## 2017-08-08 NOTE — Patient Instructions (Addendum)
Preventive Care 40-64 Years, Female Preventive care refers to lifestyle choices and visits with your health care provider that can promote health and wellness. What does preventive care include?  A yearly physical exam. This is also called an annual well check.  Dental exams once or twice a year.  Routine eye exams. Ask your health care provider how often you should have your eyes checked.  Personal lifestyle choices, including: ? Daily care of your teeth and gums. ? Regular physical activity. ? Eating a healthy diet. ? Avoiding tobacco and drug use. ? Limiting alcohol use. ? Practicing safe sex. ? Taking low-dose aspirin daily starting at age 58. ? Taking vitamin and mineral supplements as recommended by your health care provider. What happens during an annual well check? The services and screenings done by your health care provider during your annual well check will depend on your age, overall health, lifestyle risk factors, and family history of disease. Counseling Your health care provider may ask you questions about your:  Alcohol use.  Tobacco use.  Drug use.  Emotional well-being.  Home and relationship well-being.  Sexual activity.  Eating habits.  Work and work Statistician.  Method of birth control.  Menstrual cycle.  Pregnancy history.  Screening You may have the following tests or measurements:  Height, weight, and BMI.  Blood pressure.  Lipid and cholesterol levels. These may be checked every 5 years, or more frequently if you are over 81 years old.  Skin check.  Lung cancer screening. You may have this screening every year starting at age 78 if you have a 30-pack-year history of smoking and currently smoke or have quit within the past 15 years.  Fecal occult blood test (FOBT) of the stool. You may have this test every year starting at age 65.  Flexible sigmoidoscopy or colonoscopy. You may have a sigmoidoscopy every 5 years or a colonoscopy  every 10 years starting at age 30.  Hepatitis C blood test.  Hepatitis B blood test.  Sexually transmitted disease (STD) testing.  Diabetes screening. This is done by checking your blood sugar (glucose) after you have not eaten for a while (fasting). You may have this done every 1-3 years.  Mammogram. This may be done every 1-2 years. Talk to your health care provider about when you should start having regular mammograms. This may depend on whether you have a family history of breast cancer.  BRCA-related cancer screening. This may be done if you have a family history of breast, ovarian, tubal, or peritoneal cancers.  Pelvic exam and Pap test. This may be done every 3 years starting at age 80. Starting at age 36, this may be done every 5 years if you have a Pap test in combination with an HPV test.  Bone density scan. This is done to screen for osteoporosis. You may have this scan if you are at high risk for osteoporosis.  Discuss your test results, treatment options, and if necessary, the need for more tests with your health care provider. Vaccines Your health care provider may recommend certain vaccines, such as:  Influenza vaccine. This is recommended every year.  Tetanus, diphtheria, and acellular pertussis (Tdap, Td) vaccine. You may need a Td booster every 10 years.  Varicella vaccine. You may need this if you have not been vaccinated.  Zoster vaccine. You may need this after age 5.  Measles, mumps, and rubella (MMR) vaccine. You may need at least one dose of MMR if you were born in  1958 or later. You may also need a second dose.  Pneumococcal 13-valent conjugate (PCV13) vaccine. You may need this if you have certain conditions and were not previously vaccinated.  Pneumococcal polysaccharide (PPSV23) vaccine. You may need one or two doses if you smoke cigarettes or if you have certain conditions.  Meningococcal vaccine. You may need this if you have certain  conditions.  Hepatitis A vaccine. You may need this if you have certain conditions or if you travel or work in places where you may be exposed to hepatitis A.  Hepatitis B vaccine. You may need this if you have certain conditions or if you travel or work in places where you may be exposed to hepatitis B.  Haemophilus influenzae type b (Hib) vaccine. You may need this if you have certain conditions.  Talk to your health care provider about which screenings and vaccines you need and how often you need them. This information is not intended to replace advice given to you by your health care provider. Make sure you discuss any questions you have with your health care provider. Document Released: 07/23/2015 Document Revised: 03/15/2016 Document Reviewed: 04/27/2015 Elsevier Interactive Patient Education  2018 [place] Cuff Tendinitis Rotator cuff tendinitis is inflammation of the tough, cord-like bands that connect muscle to bone (tendons) in the rotator cuff. The rotator cuff includes all of the muscles and tendons that connect the arm to the shoulder. The rotator cuff holds the head of the upper arm bone (humerus) in the cup (fossa) of the shoulder blade (scapula). This condition can lead to a long-lasting (chronic) tear. The tear may be partial or complete. What are the causes? This condition is usually caused by overusing the rotator cuff. What increases the risk? This condition is more likely to develop in athletes and workers who frequently use their shoulder or reach over their heads. This can include activities such as:  Tennis.  Baseball or softball.  Swimming.  Construction work.  Painting.  What are the signs or symptoms? Symptoms of this condition include:  Pain spreading (radiating) from the shoulder to the upper arm.  Swelling and tenderness in front of the shoulder.  Pain when reaching, pulling, or lifting the arm above the head.  Pain when lowering  the arm from above the head.  Minor pain in the shoulder when resting.  Increased pain in the shoulder at night.  Difficulty placing the arm behind the back.  How is this diagnosed? This condition is diagnosed with a medical history and physical exam. Tests may also be done, including:  X-rays.  MRI.  Ultrasounds.  CT or MR arthrogram. During this test, a contrast material is injected and then images are taken.  How is this treated? Treatment for this condition depends on the severity of the condition. In less severe cases, treatment may include:  Rest. This may be done with a sling that holds the shoulder still (immobilization). Your health care provider may also recommend avoiding activities that involve lifting your arm over your head.  Icing the shoulder.  Anti-inflammatory medicines, such as aspirin or ibuprofen.  In more severe cases, treatment may include:  Physical therapy.  Steroid injections.  Surgery.  Follow these instructions at home: If you have a sling:  Wear the sling as told by your health care provider. Remove it only as told by your health care provider.  Loosen the sling if your fingers tingle, become numb, or turn cold and blue.  Keep the sling  clean.  If the sling is not waterproof, do not let it get wet. Remove it, if allowed, or cover it with a watertight covering when you take a bath or shower. Managing pain, stiffness, and swelling  If directed, put ice on the injured area. ? If you have a removable sling, remove it as told by your health care provider. ? Put ice in a plastic bag. ? Place a towel between your skin and the bag. ? Leave the ice on for 20 minutes, 2-3 times a day.  Move your fingers often to avoid stiffness and to lessen swelling.  Raise (elevate) the injured area above the level of your heart while you are lying down.  Find a comfortable sleeping position or sleep on a recliner, if available. Driving  Do not drive  or use heavy machinery while taking prescription pain medicine.  Ask your health care provider when it is safe to drive if you have a sling on your arm. Activity  Rest your shoulder as told by your health care provider.  Return to your normal activities as told by your health care provider. Ask your health care provider what activities are safe for you.  Do any exercises or stretches as told by your health care provider.  If you do repetitive overhead tasks, take small breaks in between and include stretching exercises as told by your health care provider. General instructions  Do not use any products that contain nicotine or tobacco, such as cigarettes and e-cigarettes. These can delay healing. If you need help quitting, ask your health care provider.  Take over-the-counter and prescription medicines only as told by your health care provider.  Keep all follow-up visits as told by your health care provider. This is important. Contact a health care provider if:  Your pain gets worse.  You have new pain in your arm, hands, or fingers.  Your pain is not relieved with medicine or does not get better after 6 weeks of treatment.  You have cracking sensations when moving your shoulder in certain directions.  You hear a snapping sound after using your shoulder, followed by severe pain and weakness. Get help right away if:  Your arm, hand, or fingers are numb or tingling.  Your arm, hand, or fingers are swollen or painful or they turn white or blue. Summary  Rotator cuff tendinitis is inflammation of the tough, cord-like bands that connect muscle to bone (tendons) in the rotator cuff.  This condition is usually caused by overusing the rotator cuff, which includes all of the muscles and tendons that connect the arm to the shoulder.  This condition is more likely to develop in athletes and workers who frequently use their shoulder or reach over their heads.  Treatment generally  includes rest, anti-inflammatory medicines, and icing. In some cases, physical therapy and steroid injections may be needed. In severe cases, surgery may be needed. This information is not intended to replace advice given to you by your health care provider. Make sure you discuss any questions you have with your health care provider. Document Released: 09/16/2003 Document Revised: 06/12/2016 Document Reviewed: 06/12/2016 Elsevier Interactive Patient Education  2017 [place].  Shoulder Impingement Syndrome Rehab Ask your health care provider which exercises are safe for you. Do exercises exactly as told by your health care provider and adjust them as directed. It is normal to feel mild stretching, pulling, tightness, or discomfort as you do these exercises, but you should stop right away if you feel  sudden pain or your pain gets worse.Do not begin these exercises until told by your health care provider. Stretching and range of motion exercise This exercise warms up your muscles and joints and improves the movement and flexibility of your shoulder. This exercise also helps to relieve pain and stiffness. Exercise A: Passive horizontal adduction  1. Sit or stand and pull your left / right elbow across your chest, toward your other shoulder. Stop when you feel a gentle stretch in the back of your shoulder and upper arm. ? Keep your arm at shoulder height. ? Keep your arm as close to your body as you comfortably can. 2. Hold for __________ seconds. 3. Slowly return to the starting position. Repeat __________ times. Complete this exercise __________ times a day. Strengthening exercises These exercises build strength and endurance in your shoulder. Endurance is the ability to use your muscles for a long time, even after they get tired. Exercise B: External rotation, isometric 1. Stand or sit in a doorway, facing the door frame. 2. Bend your left / right elbow and place the back of your wrist  against the door frame. Only your wrist should be touching the frame. Keep your upper arm at your side. 3. Gently press your wrist against the door frame, as if you are trying to push your arm away from your abdomen. ? Avoid shrugging your shoulder while you press your hand against the door frame. Keep your shoulder blade tucked down toward the middle of your back. 4. Hold for __________ seconds. 5. Slowly release the tension, and relax your muscles completely before you do the exercise again. Repeat __________ times. Complete this exercise __________ times a day. Exercise C: Internal rotation, isometric  1. Stand or sit in a doorway, facing the door frame. 2. Bend your left / right elbow and place the inside of your wrist against the door frame. Only your wrist should be touching the frame. Keep your upper arm at your side. 3. Gently press your wrist against the door frame, as if you are trying to push your arm toward your abdomen. ? Avoid shrugging your shoulder while you press your hand against the door frame. Keep your shoulder blade tucked down toward the middle of your back. 4. Hold for __________ seconds. 5. Slowly release the tension, and relax your muscles completely before you do the exercise again. Repeat __________ times. Complete this exercise __________ times a day. Exercise D: Scapular protraction, supine  1. Lie on your back on a firm surface. Hold a __________ weight in your left / right hand. 2. Raise your left / right arm straight into the air so your hand is directly above your shoulder joint. 3. Push the weight into the air so your shoulder lifts off of the surface that you are lying on. Do not move your head, neck, or back. 4. Hold for __________ seconds. 5. Slowly return to the starting position. Let your muscles relax completely before you repeat this exercise. Repeat __________ times. Complete this exercise __________ times a day. Exercise E: Scapular  retraction  1. Sit in a stable chair without armrests, or stand. 2. Secure an exercise band to a stable object in front of you so the band is at shoulder height. 3. Hold one end of the exercise band in each hand. Your palms should face down. 4. Squeeze your shoulder blades together and move your elbows slightly behind you. Do not shrug your shoulders while you do this. 5. Hold for __________  seconds. 6. Slowly return to the starting position. Repeat __________ times. Complete this exercise __________ times a day. Exercise F: Shoulder extension  1. Sit in a stable chair without armrests, or stand. 2. Secure an exercise band to a stable object in front of you where the band is above shoulder height. 3. Hold one end of the exercise band in each hand. 4. Straighten your elbows and lift your hands up to shoulder height. 5. Squeeze your shoulder blades together and pull your hands down to the sides of your thighs. Stop when your hands are straight down by your sides. Do not let your hands go behind your body. 6. Hold for __________ seconds. 7. Slowly return to the starting position. Repeat __________ times. Complete this exercise __________ times a day. This information is not intended to replace advice given to you by your health care provider. Make sure you discuss any questions you have with your health care provider. Document Released: 06/26/2005 Document Revised: 03/02/2016 Document Reviewed: 05/29/2015 Elsevier Interactive Patient Education  2018 [employer].  Please do call to schedule your mammogram; the number to schedule one at either [facility] or [facility] is [phone]

## 2017-08-08 NOTE — Assessment & Plan Note (Signed)
Stable, continue present medications.   

## 2017-08-08 NOTE — Progress Notes (Signed)
BP (!) 149/86 (BP Location: Left Arm, Cuff Size: Large)   Pulse 70   Temp 97.7 F (36.5 C) (Oral)   Ht 5' 1.1" (1.552 m)   Wt 160 lb 6.4 oz (72.8 kg)   LMP 10/14/1992 (Approximate)   SpO2 98%   BMI 30.21 kg/m    Subjective:    Patient ID: Linda Boyle, female    DOB: [birth year], 57 y.o.   MRN: [id number]  HPI: Linda Boyle is a 57 y.o. female  Chief Complaint  Patient presents with  . Annual Exam   Hypertension Thinking it's high as she changed her time she is taking medication. Using medications without difficulty Average home BPs Does not check   No problems or lightheadedness No chest pain with exertion or shortness of breath No Edema   Hyperlipidemia Using medications without problems: No Muscle aches  Diet compliance:Exercise: Diet is doing well.    Relevant past medical, surgical, family and social history reviewed and updated as indicated. Interim medical history since our last visit reviewed. Allergies and medications reviewed and updated.  Review of Systems  Constitutional: Negative.   HENT: Negative.   Eyes: Negative.   Respiratory: Negative.   Cardiovascular: Negative.   Gastrointestinal: Negative.   Musculoskeletal:       Left shoulder pain.  Wrist pain with activity  Skin: Negative.   Neurological: Negative.   Psychiatric/Behavioral: Negative.    Per HPI unless specifically indicated above     Objective:    BP (!) 149/86 (BP Location: Left Arm, Cuff Size: Large)   Pulse 70   Temp 97.7 F (36.5 C) (Oral)   Ht 5' 1.1" (1.552 m)   Wt 160 lb 6.4 oz (72.8 kg)   LMP 10/14/1992 (Approximate)   SpO2 98%   BMI 30.21 kg/m   Wt Readings from Last 3 Encounters:  08/08/17 160 lb 6.4 oz (72.8 kg)  01/26/17 157 lb 11.2 oz (71.5 kg)  07/24/16 157 lb 3.2 oz (71.3 kg)    Physical Exam  Constitutional: She is oriented to person, place, and time. She appears well-developed and well-nourished.  HENT:  Head: Normocephalic and atraumatic.    Eyes: Pupils are equal, round, and reactive to light. Right eye exhibits no discharge. Left eye exhibits no discharge. No scleral icterus.  Neck: Normal range of motion. Neck supple. Carotid bruit is not present. No thyromegaly present.  Cardiovascular: Normal rate, regular rhythm and normal heart sounds. Exam reveals no gallop and no friction rub.  No murmur heard. Pulmonary/Chest: Effort normal and breath sounds normal. No respiratory distress. She has no wheezes. She has no rales.  Abdominal: Soft. Bowel sounds are normal. There is no tenderness. There is no rebound.  Genitourinary: No breast swelling, tenderness or discharge.  Musculoskeletal: Normal range of motion.  Lymphadenopathy:    She has no cervical adenopathy.  Neurological: She is alert and oriented to person, place, and time.  Skin: Skin is warm, dry and intact. No rash noted.  Psychiatric: She has a normal mood and affect. Her speech is normal and behavior is normal. Judgment and thought content normal. Cognition and memory are normal.    Results for orders placed or performed in visit on 01/26/17  Comprehensive metabolic panel  Result Value Ref Range   Glucose 96 65 - 99 mg/dL   BUN 10 6 - 24 mg/dL   Creatinine, Ser 0.85 0.57 - 1.00 mg/dL   GFR calc non Af Amer 76 >59 mL/min/1.73   GFR  calc Af Amer 88 >59 mL/min/1.73   BUN/Creatinine Ratio 12 9 - 23   Sodium 144 134 - 144 mmol/L   Potassium 3.6 3.5 - 5.2 mmol/L   Chloride 103 96 - 106 mmol/L   CO2 27 20 - 29 mmol/L   Calcium 9.3 8.7 - 10.2 mg/dL   Total Protein 7.1 6.0 - 8.5 g/dL   Albumin 4.3 3.5 - 5.5 g/dL   Globulin, Total 2.8 1.5 - 4.5 g/dL   Albumin/Globulin Ratio 1.5 1.2 - 2.2   Bilirubin Total 0.5 0.0 - 1.2 mg/dL   Alkaline Phosphatase 71 39 - 117 IU/L   AST 57 (H) 0 - 40 IU/L   ALT 72 (H) 0 - 32 IU/L  Lipid Panel w/o Chol/HDL Ratio  Result Value Ref Range   Cholesterol, Total 158 100 - 199 mg/dL   Triglycerides 96 0 - 149 mg/dL   HDL 50 >39 mg/dL    VLDL Cholesterol Cal 19 5 - 40 mg/dL   LDL Calculated 89 0 - 99 mg/dL      Assessment & Plan:   Problem List Items Addressed This Visit      Unprioritized   Benign hypertension    BP is high today.  She thinks it's because she is taking her BP med at the wrong time.  Switch to AM at pt request and recheck on month      Relevant Medications   verapamil (CALAN-SR) 180 MG CR tablet   atorvastatin (LIPITOR) 20 MG tablet   Other Relevant Orders   Comprehensive metabolic panel   Lipid Panel w/o Chol/HDL Ratio   TSH   GERD (gastroesophageal reflux disease)    Stable, continue present medications.        Hyperlipidemia    Check today      Relevant Medications   verapamil (CALAN-SR) 180 MG CR tablet   atorvastatin (LIPITOR) 20 MG tablet   Rotator cuff disorder    Bilateral with weakness on empty can test.  Exercises given.  Encouraged personal training       Other Visit Diagnoses    Annual physical exam    -  Primary   Relevant Orders   CBC with Differential/Platelet   MM DIGITAL SCREENING BILATERAL   Hypoxia       Pulse ox 80% on room air.  Up to 100% on 2 L O2.  Encuraged to use O2 at home       Follow up plan: Return in about 4 weeks (around 09/05/2017).

## 2017-08-08 NOTE — Assessment & Plan Note (Signed)
Bilateral with weakness on empty can test.  Exercises given.  Encouraged personal training

## 2017-08-08 NOTE — Assessment & Plan Note (Signed)
BP is high today.  She thinks it's because she is taking her BP med at the wrong time.  Switch to AM at pt request and recheck on month

## 2017-08-09 LAB — CBC WITH DIFFERENTIAL/PLATELET
BASOS: 0 %
Basophils Absolute: 0 10*3/uL (ref 0.0–0.2)
EOS (ABSOLUTE): 0.1 10*3/uL (ref 0.0–0.4)
Eos: 3 %
HEMATOCRIT: 40.8 % (ref 34.0–46.6)
Hemoglobin: 13.6 g/dL (ref 11.1–15.9)
IMMATURE GRANS (ABS): 0 10*3/uL (ref 0.0–0.1)
Immature Granulocytes: 0 %
Lymphocytes Absolute: 1.4 10*3/uL (ref 0.7–3.1)
Lymphs: 41 %
MCH: 29.4 pg (ref 26.6–33.0)
MCHC: 33.3 g/dL (ref 31.5–35.7)
MCV: 88 fL (ref 79–97)
MONOS ABS: 0.3 10*3/uL (ref 0.1–0.9)
Monocytes: 9 %
NEUTROS ABS: 1.7 10*3/uL (ref 1.4–7.0)
Neutrophils: 47 %
PLATELETS: 298 10*3/uL (ref 150–379)
RBC: 4.63 x10E6/uL (ref 3.77–5.28)
RDW: 14.1 % (ref 12.3–15.4)
WBC: 3.5 10*3/uL (ref 3.4–10.8)

## 2017-08-09 LAB — COMPREHENSIVE METABOLIC PANEL
A/G RATIO: 1.6 (ref 1.2–2.2)
ALBUMIN: 4.3 g/dL (ref 3.5–5.5)
ALT: 58 IU/L — ABNORMAL HIGH (ref 0–32)
AST: 45 IU/L — ABNORMAL HIGH (ref 0–40)
Alkaline Phosphatase: 79 IU/L (ref 39–117)
BUN / CREAT RATIO: 10 (ref 9–23)
BUN: 9 mg/dL (ref 6–24)
Bilirubin Total: 0.6 mg/dL (ref 0.0–1.2)
CALCIUM: 9.3 mg/dL (ref 8.7–10.2)
CO2: 25 mmol/L (ref 20–29)
Chloride: 102 mmol/L (ref 96–106)
Creatinine, Ser: 0.87 mg/dL (ref 0.57–1.00)
GFR, EST AFRICAN AMERICAN: 86 mL/min/{1.73_m2} (ref >59)
GFR, EST NON AFRICAN AMERICAN: 74 mL/min/{1.73_m2} (ref >59)
GLOBULIN, TOTAL: 2.7 g/dL (ref 1.5–4.5)
Glucose: 93 mg/dL (ref 65–99)
POTASSIUM: 3.3 mmol/L — AB (ref 3.5–5.2)
SODIUM: 141 mmol/L (ref 134–144)
TOTAL PROTEIN: 7 g/dL (ref 6.0–8.5)

## 2017-08-09 LAB — LIPID PANEL W/O CHOL/HDL RATIO
Cholesterol, Total: 182 mg/dL (ref 100–199)
HDL: 47 mg/dL (ref >39)
LDL Calculated: 115 mg/dL — ABNORMAL HIGH (ref 0–99)
Triglycerides: 102 mg/dL (ref 0–149)
VLDL CHOLESTEROL CAL: 20 mg/dL (ref 5–40)

## 2017-08-09 LAB — TSH: TSH: 2.3 u[IU]/mL (ref 0.450–4.500)

## 2017-09-07 ENCOUNTER — Encounter: Payer: Self-pay | Admitting: Unknown Physician Specialty

## 2017-09-07 ENCOUNTER — Ambulatory Visit: Payer: 59 | Admitting: Unknown Physician Specialty

## 2017-09-07 DIAGNOSIS — R740 Nonspecific elevation of levels of transaminase and lactic acid dehydrogenase [LDH]: Secondary | ICD-10-CM | POA: Diagnosis not present

## 2017-09-07 DIAGNOSIS — R7401 Elevation of levels of liver transaminase levels: Secondary | ICD-10-CM

## 2017-09-07 DIAGNOSIS — Z6828 Body mass index (BMI) 28.0-28.9, adult: Secondary | ICD-10-CM | POA: Insufficient documentation

## 2017-09-07 DIAGNOSIS — E7800 Pure hypercholesterolemia, unspecified: Secondary | ICD-10-CM

## 2017-09-07 DIAGNOSIS — I10 Essential (primary) hypertension: Secondary | ICD-10-CM

## 2017-09-07 DIAGNOSIS — E669 Obesity, unspecified: Secondary | ICD-10-CM | POA: Insufficient documentation

## 2017-09-07 NOTE — Patient Instructions (Addendum)
DASH Eating Plan DASH stands for "Dietary Approaches to Stop Hypertension." The DASH eating plan is a healthy eating plan that has been shown to reduce high blood pressure (hypertension). It may also reduce your risk for type 2 diabetes, heart disease, and stroke. The DASH eating plan may also help with weight loss. What are tips for following this plan? General guidelines  Avoid eating more than 2,300 mg (milligrams) of salt (sodium) a day. If you have hypertension, you may need to reduce your sodium intake to 1,500 mg a day.  Limit alcohol intake to no more than 1 drink a day for nonpregnant women and 2 drinks a day for men. One drink equals 12 oz of beer, 5 oz of wine, or 1 oz of hard liquor.  Work with your health care provider to maintain a healthy body weight or to lose weight. Ask what an ideal weight is for you.  Get at least 30 minutes of exercise that causes your heart to beat faster (aerobic exercise) most days of the week. Activities may include walking, swimming, or biking.  Work with your health care provider or diet and nutrition specialist (dietitian) to adjust your eating plan to your individual calorie needs. Reading food labels  Check food labels for the amount of sodium per serving. Choose foods with less than 5 percent of the Daily Value of sodium. Generally, foods with less than 300 mg of sodium per serving fit into this eating plan.  To find whole grains, look for the word "whole" as the first word in the ingredient list. Shopping  Buy products labeled as "low-sodium" or "no salt added."  Buy fresh foods. Avoid canned foods and premade or frozen meals. Cooking  Avoid adding salt when cooking. Use salt-free seasonings or herbs instead of table salt or sea salt. Check with your health care provider or pharmacist before using salt substitutes.  Do not fry foods. Cook foods using healthy methods such as baking, boiling, grilling, and broiling instead.  Cook with  heart-healthy oils, such as olive, canola, soybean, or sunflower oil. Meal planning   Eat a balanced diet that includes: ? 5 or more servings of fruits and vegetables each day. At each meal, try to fill half of your plate with fruits and vegetables. ? Up to 6-8 servings of whole grains each day. ? Less than 6 oz of lean meat, poultry, or fish each day. A 3-oz serving of meat is about the same size as a deck of cards. One egg equals 1 oz. ? 2 servings of low-fat dairy each day. ? A serving of nuts, seeds, or beans 5 times each week. ? Heart-healthy fats. Healthy fats called Omega-3 fatty acids are found in foods such as flaxseeds and coldwater fish, like sardines, salmon, and mackerel.  Limit how much you eat of the following: ? Canned or prepackaged foods. ? Food that is high in trans fat, such as fried foods. ? Food that is high in saturated fat, such as fatty meat. ? Sweets, desserts, sugary drinks, and other foods with added sugar. ? Full-fat dairy products.  Do not salt foods before eating.  Try to eat at least 2 vegetarian meals each week.  Eat more home-cooked food and less restaurant, buffet, and fast food.  When eating at a restaurant, ask that your food be prepared with less salt or no salt, if possible. What foods are recommended? The items listed may not be a complete list. Talk with your dietitian about what   dietary choices are best for you. Grains Whole-grain or whole-wheat bread. Whole-grain or whole-wheat pasta. Brown rice. Oatmeal. Quinoa. Bulgur. Whole-grain and low-sodium cereals. Pita bread. Low-fat, low-sodium crackers. Whole-wheat flour tortillas. Vegetables Fresh or frozen vegetables (raw, steamed, roasted, or grilled). Low-sodium or reduced-sodium tomato and vegetable juice. Low-sodium or reduced-sodium tomato sauce and tomato paste. Low-sodium or reduced-sodium canned vegetables. Fruits All fresh, dried, or frozen fruit. Canned fruit in natural juice (without  added sugar). Meat and other protein foods Skinless chicken or turkey. Ground chicken or turkey. Pork with fat trimmed off. Fish and seafood. Egg whites. Dried beans, peas, or lentils. Unsalted nuts, nut butters, and seeds. Unsalted canned beans. Lean cuts of beef with fat trimmed off. Low-sodium, lean deli meat. Dairy Low-fat (1%) or fat-free (skim) milk. Fat-free, low-fat, or reduced-fat cheeses. Nonfat, low-sodium ricotta or cottage cheese. Low-fat or nonfat yogurt. Low-fat, low-sodium cheese. Fats and oils Soft margarine without trans fats. Vegetable oil. Low-fat, reduced-fat, or light mayonnaise and salad dressings (reduced-sodium). Canola, safflower, olive, soybean, and sunflower oils. Avocado. Seasoning and other foods Herbs. Spices. Seasoning mixes without salt. Unsalted popcorn and pretzels. Fat-free sweets. What foods are not recommended? The items listed may not be a complete list. Talk with your dietitian about what dietary choices are best for you. Grains Baked goods made with fat, such as croissants, muffins, or some breads. Dry pasta or rice meal packs. Vegetables Creamed or fried vegetables. Vegetables in a cheese sauce. Regular canned vegetables (not low-sodium or reduced-sodium). Regular canned tomato sauce and paste (not low-sodium or reduced-sodium). Regular tomato and vegetable juice (not low-sodium or reduced-sodium). Pickles. Olives. Fruits Canned fruit in a light or heavy syrup. Fried fruit. Fruit in cream or butter sauce. Meat and other protein foods Fatty cuts of meat. Ribs. Fried meat. Bacon. Sausage. Bologna and other processed lunch meats. Salami. Fatback. Hotdogs. Bratwurst. Salted nuts and seeds. Canned beans with added salt. Canned or smoked fish. Whole eggs or egg yolks. Chicken or turkey with skin. Dairy Whole or 2% milk, cream, and half-and-half. Whole or full-fat cream cheese. Whole-fat or sweetened yogurt. Full-fat cheese. Nondairy creamers. Whipped toppings.  Processed cheese and cheese spreads. Fats and oils Butter. Stick margarine. Lard. Shortening. Ghee. Bacon fat. Tropical oils, such as coconut, palm kernel, or palm oil. Seasoning and other foods Salted popcorn and pretzels. Onion salt, garlic salt, seasoned salt, table salt, and sea salt. Worcestershire sauce. Tartar sauce. Barbecue sauce. Teriyaki sauce. Soy sauce, including reduced-sodium. Steak sauce. Canned and packaged gravies. Fish sauce. Oyster sauce. Cocktail sauce. Horseradish that you find on the shelf. Ketchup. Mustard. Meat flavorings and tenderizers. Bouillon cubes. Hot sauce and Tabasco sauce. Premade or packaged marinades. Premade or packaged taco seasonings. Relishes. Regular salad dressings. Where to find more information:  National Heart, Lung, and Blood Institute: www.nhlbi.nih.gov  American Heart Association: www.heart.org Summary  The DASH eating plan is a healthy eating plan that has been shown to reduce high blood pressure (hypertension). It may also reduce your risk for type 2 diabetes, heart disease, and stroke.  With the DASH eating plan, you should limit salt (sodium) intake to 2,300 mg a day. If you have hypertension, you may need to reduce your sodium intake to 1,500 mg a day.  When on the DASH eating plan, aim to eat more fresh fruits and vegetables, whole grains, lean proteins, low-fat dairy, and heart-healthy fats.  Work with your health care provider or diet and nutrition specialist (dietitian) to adjust your eating plan to your individual   calorie needs. This information is not intended to replace advice given to you by your health care provider. Make sure you discuss any questions you have with your health care provider. Document Released: 06/15/2011 Document Revised: 06/19/2016 Document Reviewed: 06/19/2016 Elsevier Interactive Patient Education  2018 [employer]  

## 2017-09-07 NOTE — Assessment & Plan Note (Signed)
Stable, continue present medications.   

## 2017-09-07 NOTE — Progress Notes (Signed)
BP 134/83   Pulse 69   Temp 98.3 F (36.8 C) (Oral)   Ht 5' 1.1" (1.552 m)   Wt 160 lb 3.2 oz (72.7 kg)   LMP 10/14/1992 (Approximate)   SpO2 98%   BMI 30.17 kg/m    Subjective:    Patient ID: [name], female    DOB: 1961-05-01, 57 y.o.   MRN: [id number]  HPI: MANILA ROMMEL is a 57 y.o. female  Chief Complaint  Patient presents with  . Hypertension    4 week f/up    Hypertension F/u of BP today after changing which she takes her medication.   Using medications without difficulty Average home BPs: Not checking   No problems or lightheadedness No chest pain with exertion or shortness of breath No Edemas  Hyperlipidemia Using medications without problems: No Muscle aches  Diet compliance:Exercise: Not exercising much at this time.    Obesity Frustrated with weight gain.  Diet review shows her diet is reasonable.  Discussed sugar intake. Has seen a nutritionist  Elevated liver enzymes Steadily decreasing on review.  Work on diet changes  Relevant past medical, surgical, family and social history reviewed and updated as indicated. Interim medical history since our last visit reviewed. Allergies and medications reviewed and updated.  Review of Systems  Per HPI unless specifically indicated above     Objective:    BP 134/83   Pulse 69   Temp 98.3 F (36.8 C) (Oral)   Ht 5' 1.1" (1.552 m)   Wt 160 lb 3.2 oz (72.7 kg)   LMP 10/14/1992 (Approximate)   SpO2 98%   BMI 30.17 kg/m   Wt Readings from Last 3 Encounters:  09/07/17 160 lb 3.2 oz (72.7 kg)  08/08/17 160 lb 6.4 oz (72.8 kg)  01/26/17 157 lb 11.2 oz (71.5 kg)    Physical Exam  Constitutional: She is oriented to person, place, and time. She appears well-developed and well-nourished. No distress.  HENT:  Head: Normocephalic and atraumatic.  Eyes: Conjunctivae and lids are normal. Right eye exhibits no discharge. Left eye exhibits no discharge. No scleral icterus.  Neck: Normal range of  motion. Neck supple. No JVD present. Carotid bruit is not present.  Cardiovascular: Normal rate, regular rhythm and normal heart sounds.  Pulmonary/Chest: Effort normal and breath sounds normal.  Abdominal: Normal appearance. There is no splenomegaly or hepatomegaly.  Musculoskeletal: Normal range of motion.  Neurological: She is alert and oriented to person, place, and time.  Skin: Skin is warm, dry and intact. No rash noted. No pallor.  Psychiatric: She has a normal mood and affect. Her behavior is normal. Judgment and thought content normal.    Results for orders placed or performed in visit on 08/08/17  Comprehensive metabolic panel  Result Value Ref Range   Glucose 93 65 - 99 mg/dL   BUN 9 6 - 24 mg/dL   Creatinine, Ser 0.87 0.57 - 1.00 mg/dL   GFR calc non Af Amer 74 >59 mL/min/1.73   GFR calc Af Amer 86 >59 mL/min/1.73   BUN/Creatinine Ratio 10 9 - 23   Sodium 141 134 - 144 mmol/L   Potassium 3.3 (L) 3.5 - 5.2 mmol/L   Chloride 102 96 - 106 mmol/L   CO2 25 20 - 29 mmol/L   Calcium 9.3 8.7 - 10.2 mg/dL   Total Protein 7.0 6.0 - 8.5 g/dL   Albumin 4.3 3.5 - 5.5 g/dL   Globulin, Total 2.7 1.5 - 4.5 g/dL  Albumin/Globulin Ratio 1.6 1.2 - 2.2   Bilirubin Total 0.6 0.0 - 1.2 mg/dL   Alkaline Phosphatase 79 39 - 117 IU/L   AST 45 (H) 0 - 40 IU/L   ALT 58 (H) 0 - 32 IU/L  CBC with Differential/Platelet  Result Value Ref Range   WBC 3.5 3.4 - 10.8 x10E3/uL   RBC 4.63 3.77 - 5.28 x10E6/uL   Hemoglobin 13.6 11.1 - 15.9 g/dL   Hematocrit 40.8 34.0 - 46.6 %   MCV 88 79 - 97 fL   MCH 29.4 26.6 - 33.0 pg   MCHC 33.3 31.5 - 35.7 g/dL   RDW 14.1 12.3 - 15.4 %   Platelets 298 150 - 379 x10E3/uL   Neutrophils 47 Not Estab. %   Lymphs 41 Not Estab. %   Monocytes 9 Not Estab. %   Eos 3 Not Estab. %   Basos 0 Not Estab. %   Neutrophils Absolute 1.7 1.4 - 7.0 x10E3/uL   Lymphocytes Absolute 1.4 0.7 - 3.1 x10E3/uL   Monocytes Absolute 0.3 0.1 - 0.9 x10E3/uL   EOS (ABSOLUTE) 0.1 0.0  - 0.4 x10E3/uL   Basophils Absolute 0.0 0.0 - 0.2 x10E3/uL   Immature Granulocytes 0 Not Estab. %   Immature Grans (Abs) 0.0 0.0 - 0.1 x10E3/uL  Lipid Panel w/o Chol/HDL Ratio  Result Value Ref Range   Cholesterol, Total 182 100 - 199 mg/dL   Triglycerides 102 0 - 149 mg/dL   HDL 47 >39 mg/dL   VLDL Cholesterol Cal 20 5 - 40 mg/dL   LDL Calculated 115 (H) 0 - 99 mg/dL  TSH  Result Value Ref Range   TSH 2.300 0.450 - 4.500 uIU/mL      Assessment & Plan:   Problem List Items Addressed This Visit      Unprioritized   Benign hypertension    BP is better but not at goal of 130.  On Verapamil.  Will work on lifestyle changes.  DASH diet given.        Elevated transaminase level    Recheck next visit.        Hyperlipidemia    Stable, continue present medications.        Obesity (BMI 30-39.9)    Discussed reducing sugar intake.  And increasing exercise          Follow up plan: Return in about 3 months (around 12/08/2017).

## 2017-09-07 NOTE — Assessment & Plan Note (Addendum)
Discussed reducing sugar intake.  And increasing exercise

## 2017-09-07 NOTE — Assessment & Plan Note (Signed)
Recheck next visit 

## 2017-09-07 NOTE — Assessment & Plan Note (Addendum)
BP is better but not at goal of 130.  On Verapamil.  Will work on lifestyle changes.  DASH diet given.

## 2017-09-14 ENCOUNTER — Ambulatory Visit
Admission: RE | Admit: 2017-09-14 | Discharge: 2017-09-14 | Disposition: A | Discharge status: Home / Self Care | Payer: 59 | Source: Ambulatory Visit | Attending: Unknown Physician Specialty | Admitting: Unknown Physician Specialty

## 2017-09-14 DIAGNOSIS — Z0000 Encounter for general adult medical examination without abnormal findings: Secondary | ICD-10-CM

## 2017-09-14 DIAGNOSIS — Z1231 Encounter for screening mammogram for malignant neoplasm of breast: Secondary | ICD-10-CM | POA: Diagnosis not present

## 2017-10-22 ENCOUNTER — Other Ambulatory Visit: Payer: Self-pay | Admitting: Unknown Physician Specialty

## 2017-10-23 ENCOUNTER — Other Ambulatory Visit: Payer: Self-pay | Admitting: Unknown Physician Specialty

## 2017-10-24 NOTE — Telephone Encounter (Signed)
Your patient 

## 2017-12-14 ENCOUNTER — Ambulatory Visit: Payer: 59 | Admitting: Unknown Physician Specialty

## 2018-01-16 ENCOUNTER — Other Ambulatory Visit: Payer: Self-pay | Admitting: Unknown Physician Specialty

## 2018-01-17 NOTE — Telephone Encounter (Signed)
Attempted to call pt to clarify her need for refill for omeprazole 20 mg tab, see the last time that it was filled and if there was a refill on it. No answer, left message to call back and clarify.

## 2018-01-25 ENCOUNTER — Other Ambulatory Visit: Payer: Self-pay

## 2018-01-25 ENCOUNTER — Ambulatory Visit: Payer: 59 | Admitting: Unknown Physician Specialty

## 2018-01-25 ENCOUNTER — Encounter: Payer: Self-pay | Admitting: Unknown Physician Specialty

## 2018-01-25 DIAGNOSIS — R7401 Elevation of levels of liver transaminase levels: Secondary | ICD-10-CM

## 2018-01-25 DIAGNOSIS — I10 Essential (primary) hypertension: Secondary | ICD-10-CM

## 2018-01-25 DIAGNOSIS — E7800 Pure hypercholesterolemia, unspecified: Secondary | ICD-10-CM | POA: Diagnosis not present

## 2018-01-25 DIAGNOSIS — R740 Nonspecific elevation of levels of transaminase and lactic acid dehydrogenase [LDH]: Secondary | ICD-10-CM

## 2018-01-25 NOTE — Progress Notes (Signed)
BP 122/60 (BP Location: Left Arm)   Pulse 90   Temp 98.3 F (36.8 C) (Oral)   Ht 5' 1.1" (1.552 m)   Wt 154 lb 6.4 oz (70 kg)   LMP 10/14/1992 (Approximate)   SpO2 97%   BMI 29.08 kg/m    Subjective:    Patient ID: Linda Boyle, female    DOB: [birth year], 57 y.o.   MRN: [id number]  HPI: Linda Boyle is a 57 y.o. female  Chief Complaint  Patient presents with  . Follow-up  . Hypertension  . Hyperlipidemia   Hypertension Using medications without difficulty Average home BPs Not chekcing   No problems or lightheadedness No chest pain with exertion or shortness of breath No Edema   Hyperlipidemia Using medications without problems: No Muscle aches  Diet compliance:Exercise: Cut back on sweets and drinking more water.    Elevated transaminase level Noted over the last year.  Pt working on diet and exercise.  Recheck today    Relevant past medical, surgical, family and social history reviewed and updated as indicated. Interim medical history since our last visit reviewed. Allergies and medications reviewed and updated.  Review of Systems  Constitutional: Negative.   Respiratory: Negative.   Cardiovascular: Negative.   Musculoskeletal: Negative.     Per HPI unless specifically indicated above     Objective:    BP 122/60 (BP Location: Left Arm)   Pulse 90   Temp 98.3 F (36.8 C) (Oral)   Ht 5' 1.1" (1.552 m)   Wt 154 lb 6.4 oz (70 kg)   LMP 10/14/1992 (Approximate)   SpO2 97%   BMI 29.08 kg/m   Wt Readings from Last 3 Encounters:  01/25/18 154 lb 6.4 oz (70 kg)  09/07/17 160 lb 3.2 oz (72.7 kg)  08/08/17 160 lb 6.4 oz (72.8 kg)    Physical Exam  Constitutional: She is oriented to person, place, and time. She appears well-developed and well-nourished. No distress.  HENT:  Head: Normocephalic and atraumatic.  Eyes: Conjunctivae and lids are normal. Right eye exhibits no discharge. Left eye exhibits no discharge. No scleral icterus.  Neck:  Normal range of motion. Neck supple. No JVD present. Carotid bruit is not present.  Cardiovascular: Normal rate, regular rhythm and normal heart sounds.  Pulmonary/Chest: Effort normal and breath sounds normal.  Abdominal: Normal appearance. There is no splenomegaly or hepatomegaly.  Musculoskeletal: Normal range of motion.  Neurological: She is alert and oriented to person, place, and time.  Skin: Skin is warm, dry and intact. No rash noted. No pallor.  Psychiatric: She has a normal mood and affect. Her behavior is normal. Judgment and thought content normal.    Results for orders placed or performed in visit on 08/08/17  Comprehensive metabolic panel  Result Value Ref Range   Glucose 93 65 - 99 mg/dL   BUN 9 6 - 24 mg/dL   Creatinine, Ser 0.87 0.57 - 1.00 mg/dL   GFR calc non Af Amer 74 >59 mL/min/1.73   GFR calc Af Amer 86 >59 mL/min/1.73   BUN/Creatinine Ratio 10 9 - 23   Sodium 141 134 - 144 mmol/L   Potassium 3.3 (L) 3.5 - 5.2 mmol/L   Chloride 102 96 - 106 mmol/L   CO2 25 20 - 29 mmol/L   Calcium 9.3 8.7 - 10.2 mg/dL   Total Protein 7.0 6.0 - 8.5 g/dL   Albumin 4.3 3.5 - 5.5 g/dL   Globulin, Total 2.7 1.5 - 4.5  g/dL   Albumin/Globulin Ratio 1.6 1.2 - 2.2   Bilirubin Total 0.6 0.0 - 1.2 mg/dL   Alkaline Phosphatase 79 39 - 117 IU/L   AST 45 (H) 0 - 40 IU/L   ALT 58 (H) 0 - 32 IU/L  CBC with Differential/Platelet  Result Value Ref Range   WBC 3.5 3.4 - 10.8 x10E3/uL   RBC 4.63 3.77 - 5.28 x10E6/uL   Hemoglobin 13.6 11.1 - 15.9 g/dL   Hematocrit 40.8 34.0 - 46.6 %   MCV 88 79 - 97 fL   MCH 29.4 26.6 - 33.0 pg   MCHC 33.3 31.5 - 35.7 g/dL   RDW 14.1 12.3 - 15.4 %   Platelets 298 150 - 379 x10E3/uL   Neutrophils 47 Not Estab. %   Lymphs 41 Not Estab. %   Monocytes 9 Not Estab. %   Eos 3 Not Estab. %   Basos 0 Not Estab. %   Neutrophils Absolute 1.7 1.4 - 7.0 x10E3/uL   Lymphocytes Absolute 1.4 0.7 - 3.1 x10E3/uL   Monocytes Absolute 0.3 0.1 - 0.9 x10E3/uL   EOS  (ABSOLUTE) 0.1 0.0 - 0.4 x10E3/uL   Basophils Absolute 0.0 0.0 - 0.2 x10E3/uL   Immature Granulocytes 0 Not Estab. %   Immature Grans (Abs) 0.0 0.0 - 0.1 x10E3/uL  Lipid Panel w/o Chol/HDL Ratio  Result Value Ref Range   Cholesterol, Total 182 100 - 199 mg/dL   Triglycerides 102 0 - 149 mg/dL   HDL 47 >39 mg/dL   VLDL Cholesterol Cal 20 5 - 40 mg/dL   LDL Calculated 115 (H) 0 - 99 mg/dL  TSH  Result Value Ref Range   TSH 2.300 0.450 - 4.500 uIU/mL      Assessment & Plan:   Problem List Items Addressed This Visit      Unprioritized   Benign hypertension    Stable, continue present medications.        Relevant Orders   Lipid Panel w/o Chol/HDL Ratio   Elevated transaminase level    Check CMP today      Hyperlipidemia    Check lipid panel today      Relevant Orders   Comprehensive metabolic panel       Follow up plan: Return in about 6 months (around 07/28/2018), or if symptoms worsen or fail to improve.

## 2018-01-25 NOTE — Assessment & Plan Note (Signed)
Stable, continue present medications.   

## 2018-01-25 NOTE — Assessment & Plan Note (Signed)
Check CMP today 

## 2018-01-25 NOTE — Assessment & Plan Note (Signed)
Check lipid panel today 

## 2018-01-26 LAB — COMPREHENSIVE METABOLIC PANEL
ALK PHOS: 74 IU/L (ref 39–117)
ALT: 57 IU/L — ABNORMAL HIGH (ref 0–32)
AST: 44 IU/L — ABNORMAL HIGH (ref 0–40)
Albumin/Globulin Ratio: 1.5 (ref 1.2–2.2)
Albumin: 4.3 g/dL (ref 3.5–5.5)
BILIRUBIN TOTAL: 0.4 mg/dL (ref 0.0–1.2)
BUN / CREAT RATIO: 12 (ref 9–23)
BUN: 11 mg/dL (ref 6–24)
CHLORIDE: 104 mmol/L (ref 96–106)
CO2: 26 mmol/L (ref 20–29)
Calcium: 9.5 mg/dL (ref 8.7–10.2)
Creatinine, Ser: 0.89 mg/dL (ref 0.57–1.00)
GFR calc non Af Amer: 72 mL/min/{1.73_m2} (ref >59)
GFR, EST AFRICAN AMERICAN: 83 mL/min/{1.73_m2} (ref >59)
GLUCOSE: 125 mg/dL — AB (ref 65–99)
Globulin, Total: 2.9 g/dL (ref 1.5–4.5)
Potassium: 3.2 mmol/L — ABNORMAL LOW (ref 3.5–5.2)
Sodium: 144 mmol/L (ref 134–144)
TOTAL PROTEIN: 7.2 g/dL (ref 6.0–8.5)

## 2018-01-26 LAB — LIPID PANEL W/O CHOL/HDL RATIO
CHOLESTEROL TOTAL: 191 mg/dL (ref 100–199)
HDL: 46 mg/dL (ref >39)
LDL Calculated: 118 mg/dL — ABNORMAL HIGH (ref 0–99)
TRIGLYCERIDES: 137 mg/dL (ref 0–149)
VLDL Cholesterol Cal: 27 mg/dL (ref 5–40)

## 2018-01-28 NOTE — Progress Notes (Signed)
Here are your labs. We've been following your liver enzymes and they are similar to the results from last visit.  The cornerstone of treatment is a healthy diet and exercise.  We will check this again at your net visit.

## 2018-03-22 DIAGNOSIS — M9901 Segmental and somatic dysfunction of cervical region: Secondary | ICD-10-CM | POA: Diagnosis not present

## 2018-03-22 DIAGNOSIS — M9902 Segmental and somatic dysfunction of thoracic region: Secondary | ICD-10-CM | POA: Diagnosis not present

## 2018-03-22 DIAGNOSIS — M62830 Muscle spasm of back: Secondary | ICD-10-CM | POA: Diagnosis not present

## 2018-03-22 DIAGNOSIS — M40292 Other kyphosis, cervical region: Secondary | ICD-10-CM | POA: Diagnosis not present

## 2018-03-25 DIAGNOSIS — M62830 Muscle spasm of back: Secondary | ICD-10-CM | POA: Diagnosis not present

## 2018-03-25 DIAGNOSIS — M9901 Segmental and somatic dysfunction of cervical region: Secondary | ICD-10-CM | POA: Diagnosis not present

## 2018-03-25 DIAGNOSIS — M9902 Segmental and somatic dysfunction of thoracic region: Secondary | ICD-10-CM | POA: Diagnosis not present

## 2018-03-25 DIAGNOSIS — M40292 Other kyphosis, cervical region: Secondary | ICD-10-CM | POA: Diagnosis not present

## 2018-03-28 DIAGNOSIS — M9901 Segmental and somatic dysfunction of cervical region: Secondary | ICD-10-CM | POA: Diagnosis not present

## 2018-03-28 DIAGNOSIS — M62830 Muscle spasm of back: Secondary | ICD-10-CM | POA: Diagnosis not present

## 2018-03-28 DIAGNOSIS — M9902 Segmental and somatic dysfunction of thoracic region: Secondary | ICD-10-CM | POA: Diagnosis not present

## 2018-03-28 DIAGNOSIS — M40292 Other kyphosis, cervical region: Secondary | ICD-10-CM | POA: Diagnosis not present

## 2018-03-30 DIAGNOSIS — M62830 Muscle spasm of back: Secondary | ICD-10-CM | POA: Diagnosis not present

## 2018-03-30 DIAGNOSIS — M9901 Segmental and somatic dysfunction of cervical region: Secondary | ICD-10-CM | POA: Diagnosis not present

## 2018-03-30 DIAGNOSIS — M9902 Segmental and somatic dysfunction of thoracic region: Secondary | ICD-10-CM | POA: Diagnosis not present

## 2018-03-30 DIAGNOSIS — M40292 Other kyphosis, cervical region: Secondary | ICD-10-CM | POA: Diagnosis not present

## 2018-04-02 DIAGNOSIS — M9902 Segmental and somatic dysfunction of thoracic region: Secondary | ICD-10-CM | POA: Diagnosis not present

## 2018-04-02 DIAGNOSIS — M9901 Segmental and somatic dysfunction of cervical region: Secondary | ICD-10-CM | POA: Diagnosis not present

## 2018-04-02 DIAGNOSIS — M40292 Other kyphosis, cervical region: Secondary | ICD-10-CM | POA: Diagnosis not present

## 2018-04-02 DIAGNOSIS — M62830 Muscle spasm of back: Secondary | ICD-10-CM | POA: Diagnosis not present

## 2018-04-04 DIAGNOSIS — M9901 Segmental and somatic dysfunction of cervical region: Secondary | ICD-10-CM | POA: Diagnosis not present

## 2018-04-04 DIAGNOSIS — M62830 Muscle spasm of back: Secondary | ICD-10-CM | POA: Diagnosis not present

## 2018-04-04 DIAGNOSIS — M40292 Other kyphosis, cervical region: Secondary | ICD-10-CM | POA: Diagnosis not present

## 2018-04-04 DIAGNOSIS — M9902 Segmental and somatic dysfunction of thoracic region: Secondary | ICD-10-CM | POA: Diagnosis not present

## 2018-04-05 DIAGNOSIS — M9901 Segmental and somatic dysfunction of cervical region: Secondary | ICD-10-CM | POA: Diagnosis not present

## 2018-04-05 DIAGNOSIS — M62830 Muscle spasm of back: Secondary | ICD-10-CM | POA: Diagnosis not present

## 2018-04-05 DIAGNOSIS — M40292 Other kyphosis, cervical region: Secondary | ICD-10-CM | POA: Diagnosis not present

## 2018-04-05 DIAGNOSIS — M9902 Segmental and somatic dysfunction of thoracic region: Secondary | ICD-10-CM | POA: Diagnosis not present

## 2018-04-08 DIAGNOSIS — M62830 Muscle spasm of back: Secondary | ICD-10-CM | POA: Diagnosis not present

## 2018-04-08 DIAGNOSIS — M9901 Segmental and somatic dysfunction of cervical region: Secondary | ICD-10-CM | POA: Diagnosis not present

## 2018-04-08 DIAGNOSIS — M9902 Segmental and somatic dysfunction of thoracic region: Secondary | ICD-10-CM | POA: Diagnosis not present

## 2018-04-08 DIAGNOSIS — M40292 Other kyphosis, cervical region: Secondary | ICD-10-CM | POA: Diagnosis not present

## 2018-04-11 DIAGNOSIS — M9902 Segmental and somatic dysfunction of thoracic region: Secondary | ICD-10-CM | POA: Diagnosis not present

## 2018-04-11 DIAGNOSIS — M40292 Other kyphosis, cervical region: Secondary | ICD-10-CM | POA: Diagnosis not present

## 2018-04-11 DIAGNOSIS — M62830 Muscle spasm of back: Secondary | ICD-10-CM | POA: Diagnosis not present

## 2018-04-11 DIAGNOSIS — M9901 Segmental and somatic dysfunction of cervical region: Secondary | ICD-10-CM | POA: Diagnosis not present

## 2018-04-16 DIAGNOSIS — M40292 Other kyphosis, cervical region: Secondary | ICD-10-CM | POA: Diagnosis not present

## 2018-04-16 DIAGNOSIS — M9901 Segmental and somatic dysfunction of cervical region: Secondary | ICD-10-CM | POA: Diagnosis not present

## 2018-04-16 DIAGNOSIS — M62830 Muscle spasm of back: Secondary | ICD-10-CM | POA: Diagnosis not present

## 2018-04-16 DIAGNOSIS — M9902 Segmental and somatic dysfunction of thoracic region: Secondary | ICD-10-CM | POA: Diagnosis not present

## 2018-04-17 ENCOUNTER — Other Ambulatory Visit: Payer: Self-pay | Admitting: Unknown Physician Specialty

## 2018-04-18 DIAGNOSIS — M9902 Segmental and somatic dysfunction of thoracic region: Secondary | ICD-10-CM | POA: Diagnosis not present

## 2018-04-18 DIAGNOSIS — M9901 Segmental and somatic dysfunction of cervical region: Secondary | ICD-10-CM | POA: Diagnosis not present

## 2018-04-18 DIAGNOSIS — M40292 Other kyphosis, cervical region: Secondary | ICD-10-CM | POA: Diagnosis not present

## 2018-04-18 DIAGNOSIS — M62830 Muscle spasm of back: Secondary | ICD-10-CM | POA: Diagnosis not present

## 2018-04-26 DIAGNOSIS — M9902 Segmental and somatic dysfunction of thoracic region: Secondary | ICD-10-CM | POA: Diagnosis not present

## 2018-04-26 DIAGNOSIS — M40292 Other kyphosis, cervical region: Secondary | ICD-10-CM | POA: Diagnosis not present

## 2018-04-26 DIAGNOSIS — M9901 Segmental and somatic dysfunction of cervical region: Secondary | ICD-10-CM | POA: Diagnosis not present

## 2018-04-26 DIAGNOSIS — M62830 Muscle spasm of back: Secondary | ICD-10-CM | POA: Diagnosis not present

## 2018-05-01 DIAGNOSIS — M40292 Other kyphosis, cervical region: Secondary | ICD-10-CM | POA: Diagnosis not present

## 2018-05-01 DIAGNOSIS — M62830 Muscle spasm of back: Secondary | ICD-10-CM | POA: Diagnosis not present

## 2018-05-01 DIAGNOSIS — M9902 Segmental and somatic dysfunction of thoracic region: Secondary | ICD-10-CM | POA: Diagnosis not present

## 2018-05-01 DIAGNOSIS — M9901 Segmental and somatic dysfunction of cervical region: Secondary | ICD-10-CM | POA: Diagnosis not present

## 2018-05-07 DIAGNOSIS — M62830 Muscle spasm of back: Secondary | ICD-10-CM | POA: Diagnosis not present

## 2018-05-07 DIAGNOSIS — M9901 Segmental and somatic dysfunction of cervical region: Secondary | ICD-10-CM | POA: Diagnosis not present

## 2018-05-07 DIAGNOSIS — M9902 Segmental and somatic dysfunction of thoracic region: Secondary | ICD-10-CM | POA: Diagnosis not present

## 2018-05-07 DIAGNOSIS — M40292 Other kyphosis, cervical region: Secondary | ICD-10-CM | POA: Diagnosis not present

## 2018-05-27 DIAGNOSIS — H5213 Myopia, bilateral: Secondary | ICD-10-CM | POA: Diagnosis not present

## 2018-06-04 ENCOUNTER — Other Ambulatory Visit: Payer: Self-pay | Admitting: Unknown Physician Specialty

## 2018-06-13 ENCOUNTER — Other Ambulatory Visit: Payer: Self-pay | Admitting: Unknown Physician Specialty

## 2018-06-13 NOTE — Telephone Encounter (Signed)
Approved request. See LOV note. Requested Prescriptions  Pending Prescriptions Disp Refills  . verapamil (VERELAN PM) 180 MG 24 hr capsule [Pharmacy Med Name: VERAPAMIL ER 180 MG CAPSULE 180 CAP] 90 capsule 1    Sig: TAKE 1 CAPSULE BY MOUTH AT BEDTIME     Off-Protocol Failed - 06/13/2018  1:00 PM      Failed - Medication not assigned to a protocol, review manually.      Passed - Valid encounter within last 12 months    Recent Outpatient Visits          4 months ago Elevated transaminase level   [place], NP   9 months ago Obesity (BMI 30-39.9)   [facility] Kathrine Haddock, NP   10 months ago Annual physical exam   [facility] Kathrine Haddock, NP   1 year ago Soreness breast   [place] Kathrine Haddock, NP   1 year ago Need for diphtheria-tetanus-pertussis (Tdap) vaccine, adult/adolescent   [facility] Kathrine Haddock, NP

## 2018-06-30 DIAGNOSIS — M545 Low back pain: Secondary | ICD-10-CM | POA: Diagnosis not present

## 2018-06-30 DIAGNOSIS — R3915 Urgency of urination: Secondary | ICD-10-CM | POA: Diagnosis not present

## 2018-09-13 ENCOUNTER — Encounter: Payer: Self-pay | Admitting: Nurse Practitioner

## 2018-09-13 ENCOUNTER — Ambulatory Visit (INDEPENDENT_AMBULATORY_CARE_PROVIDER_SITE_OTHER): Payer: 59 | Admitting: Nurse Practitioner

## 2018-09-13 ENCOUNTER — Other Ambulatory Visit: Payer: Self-pay | Admitting: Nurse Practitioner

## 2018-09-13 VITALS — BP 117/77 | HR 70 | Temp 98.0°F | Ht 61.5 in | Wt 157.6 lb

## 2018-09-13 DIAGNOSIS — E669 Obesity, unspecified: Secondary | ICD-10-CM | POA: Diagnosis not present

## 2018-09-13 DIAGNOSIS — R740 Nonspecific elevation of levels of transaminase and lactic acid dehydrogenase [LDH]: Secondary | ICD-10-CM

## 2018-09-13 DIAGNOSIS — R7301 Impaired fasting glucose: Secondary | ICD-10-CM

## 2018-09-13 DIAGNOSIS — J301 Allergic rhinitis due to pollen: Secondary | ICD-10-CM | POA: Diagnosis not present

## 2018-09-13 DIAGNOSIS — R7303 Prediabetes: Secondary | ICD-10-CM | POA: Insufficient documentation

## 2018-09-13 DIAGNOSIS — K219 Gastro-esophageal reflux disease without esophagitis: Secondary | ICD-10-CM | POA: Diagnosis not present

## 2018-09-13 DIAGNOSIS — I10 Essential (primary) hypertension: Secondary | ICD-10-CM | POA: Diagnosis not present

## 2018-09-13 DIAGNOSIS — E7800 Pure hypercholesterolemia, unspecified: Secondary | ICD-10-CM

## 2018-09-13 DIAGNOSIS — Z0000 Encounter for general adult medical examination without abnormal findings: Secondary | ICD-10-CM | POA: Diagnosis not present

## 2018-09-13 DIAGNOSIS — R7401 Elevation of levels of liver transaminase levels: Secondary | ICD-10-CM

## 2018-09-13 DIAGNOSIS — Z1231 Encounter for screening mammogram for malignant neoplasm of breast: Secondary | ICD-10-CM

## 2018-09-13 NOTE — Assessment & Plan Note (Signed)
Chronic, ongoing.  Continue current medication regimen.   CBC and CMP today.

## 2018-09-13 NOTE — Progress Notes (Signed)
BP 117/77 (BP Location: Left Arm, Patient Position: Sitting, Cuff Size: Normal)   Pulse 70   Temp 98 F (36.7 C) (Oral)   Ht 5' 1.5" (1.562 m)   Wt 157 lb 9.6 oz (71.5 kg)   LMP 10/14/1992 (Approximate)   SpO2 98%   BMI 29.30 kg/m    Subjective:    Patient ID: Linda Boyle, female    DOB: 11-18-1960, 58 y.o.   MRN: [id number]  HPI: Linda Boyle is a 58 y.o. female presenting on 09/13/2018 for comprehensive medical examination. Current medical complaints include:none  She currently lives with: husband and daughter/son Menopausal Symptoms: hot flashes occasionally   HYPERTENSION / HYPERLIPIDEMIA Continues on Verapamil and Lipitor. Satisfied with current treatment? yes Duration of hypertension: chronic BP monitoring frequency: not checking BP range:  BP medication side effects: no Duration of hyperlipidemia: chronic Cholesterol medication side effects: no Cholesterol supplements: none Medication compliance: good compliance Aspirin: no Recent stressors: no Recurrent headaches: no Visual changes: no Palpitations: no Dyspnea: no Chest pain: no Lower extremity edema: no Dizzy/lightheaded: no   GERD Continues on Prilosec. GERD control status: controlled  Satisfied with current treatment? yes Heartburn frequency: rare Medication side effects: no  Medication compliance: good Previous GERD medications: none Antacid use frequency:  none Duration:  Nature:  Location:  Heartburn duration:  Alleviatiating factors:   Aggravating factors: yogurt and spicy foods Dysphagia: no Odynophagia:  no Hematemesis: no Blood in stool: no EGD: no   ALLERGIC RHINITIS: Currently take Flonase and Allegra.  She takes these as needed.  Reports good control with this.  Denies rhinorrhea, cough, congestion, or eye itchiness.     Depression Screen done today and results listed below:  Depression screen [facility] 2/9 09/13/2018 08/08/2017 07/24/2016  Decreased Interest 0 0 0  Down,  Depressed, Hopeless 0 0 0  PHQ - 2 Score 0 0 0  Altered sleeping 0 0 -  Tired, decreased energy 0 0 -  Change in appetite 0 0 -  Feeling bad or failure about yourself  0 1 -  Trouble concentrating 0 0 -  Moving slowly or fidgety/restless 0 0 -  Suicidal thoughts 0 0 -  PHQ-9 Score 0 1 -  Difficult doing work/chores Not difficult at all - -    The patient does not have a history of falls. I did not complete a risk assessment for falls. A plan of care for falls was not documented.   Past Medical History:  Past Medical History:  Diagnosis Date  . Allergy   . GERD (gastroesophageal reflux disease)   . Hyperlipidemia   . Hypertension   . Mitral valve prolapse   . Neck sprain   . Vertigo     Surgical History:  Past Surgical History:  Procedure Laterality Date  . ABDOMINAL HYSTERECTOMY     partial  . DILATION AND CURETTAGE OF UTERUS  8185,[id number]  . TOTAL VAGINAL HYSTERECTOMY  1994   Ovaries remain  . TUBAL LIGATION      Medications:  Current Outpatient Medications on File Prior to Visit  Medication Sig  . atorvastatin (LIPITOR) 20 MG tablet Take 1 tablet (20 mg total) by mouth daily.  . Cholecalciferol (VITAMIN D-3) 1000 UNITS CAPS Take 1,000 Units by mouth daily.  . fexofenadine (ALLEGRA) 180 MG tablet Take 1 tablet (180 mg total) by mouth daily as needed for allergies or rhinitis.  . fluticasone (FLONASE) 50 MCG/ACT nasal spray USE 2 SPRAYS IN EACH NOSTRIL ONCE DAILY  .  loratadine (CLARITIN) 10 MG tablet Take 10 mg by mouth daily.  Marland Kitchen omeprazole (PRILOSEC) 20 MG capsule TAKE 1 CAPSULE BY MOUTH DAILY.  . verapamil (VERELAN PM) 180 MG 24 hr capsule TAKE 1 CAPSULE BY MOUTH AT BEDTIME   No current facility-administered medications on file prior to visit.     Allergies:  No Known Allergies  Social History:  Social History   Socioeconomic History  . Marital status: Married    Spouse name: Not on file  . Number of children: Not on file  . Years of education: Not on  file  . Highest education level: Not on file  Occupational History  . Not on file  Social Needs  . Financial resource strain: Not on file  . Food insecurity:    Worry: Not on file    Inability: Not on file  . Transportation needs:    Medical: Not on file    Non-medical: Not on file  Tobacco Use  . Smoking status: Never Smoker  . Smokeless tobacco: Never Used  Substance and Sexual Activity  . Alcohol use: No  . Drug use: No  . Sexual activity: Not Currently  Lifestyle  . Physical activity:    Days per week: Not on file    Minutes per session: Not on file  . Stress: Not on file  Relationships  . Social connections:    Talks on phone: Not on file    Gets together: Not on file    Attends religious service: Not on file    Active member of club or organization: Not on file    Attends meetings of clubs or organizations: Not on file    Relationship status: Not on file  . Intimate partner violence:    Fear of current or ex partner: Not on file    Emotionally abused: Not on file    Physically abused: Not on file    Forced sexual activity: Not on file  Other Topics Concern  . Not on file  Social History Narrative  . Not on file   Social History   Tobacco Use  Smoking Status Never Smoker  Smokeless Tobacco Never Used   Social History   Substance and Sexual Activity  Alcohol Use No    Family History:  Family History  Problem Relation Age of Onset  . Diabetes Mother   . Alzheimer's disease Father   . Hypertension Father   . Diabetes Sister   . Hypertension Brother   . Asthma Brother   . Diabetes Brother   . Hypertension Sister   . Breast cancer Neg Hx     Past medical history, surgical history, medications, allergies, family history and social history reviewed with patient today and changes made to appropriate areas of the chart.   Review of Systems - Negative All other ROS negative except what is listed above and in the HPI.      Objective:    BP 117/77  (BP Location: Left Arm, Patient Position: Sitting, Cuff Size: Normal)   Pulse 70   Temp 98 F (36.7 C) (Oral)   Ht 5' 1.5" (1.562 m)   Wt 157 lb 9.6 oz (71.5 kg)   LMP 10/14/1992 (Approximate)   SpO2 98%   BMI 29.30 kg/m   Wt Readings from Last 3 Encounters:  09/13/18 157 lb 9.6 oz (71.5 kg)  01/25/18 154 lb 6.4 oz (70 kg)  09/07/17 160 lb 3.2 oz (72.7 kg)    Physical Exam Vitals signs and  nursing note reviewed.  Constitutional:      General: She is awake.     Appearance: She is well-developed and overweight.  HENT:     Head: Normocephalic and atraumatic.     Right Ear: Hearing, tympanic membrane, ear canal and external ear normal.     Left Ear: Hearing, tympanic membrane, ear canal and external ear normal.     Nose: Nose normal.     Right Sinus: No maxillary sinus tenderness or frontal sinus tenderness.     Left Sinus: No maxillary sinus tenderness or frontal sinus tenderness.     Mouth/Throat:     Mouth: Mucous membranes are moist.     Pharynx: Oropharynx is clear. Uvula midline.  Eyes:     General: Lids are normal.        Right eye: No discharge.        Left eye: No discharge.     Extraocular Movements: Extraocular movements intact.     Conjunctiva/sclera: Conjunctivae normal.     Pupils: Pupils are equal, round, and reactive to light.     Visual Fields: Right eye visual fields normal and left eye visual fields normal.  Neck:     Musculoskeletal: Normal range of motion and neck supple.     Thyroid: No thyromegaly.     Vascular: No carotid bruit or JVD.  Cardiovascular:     Rate and Rhythm: Normal rate and regular rhythm.     Pulses: Normal pulses.     Heart sounds: Normal heart sounds. No murmur. No gallop.   Pulmonary:     Effort: Pulmonary effort is normal.     Breath sounds: Normal breath sounds.  Chest:     Breasts:        Right: Normal. No swelling, bleeding, inverted nipple, mass, nipple discharge or skin change.        Left: Normal. No swelling, bleeding,  inverted nipple, mass, nipple discharge or skin change.  Abdominal:     General: Bowel sounds are normal.     Palpations: Abdomen is soft. There is no hepatomegaly or splenomegaly.  Musculoskeletal: Normal range of motion.     Right lower leg: No edema.     Left lower leg: No edema.  Lymphadenopathy:     Cervical: No cervical adenopathy.     Upper Body:     Right upper body: No supraclavicular, axillary or pectoral adenopathy.     Left upper body: No supraclavicular, axillary or pectoral adenopathy.  Skin:    General: Skin is warm and dry.     Comments: Small nevus to right interior upper calf, reports no color change.  Neurological:     Mental Status: She is alert and oriented to person, place, and time.     Cranial Nerves: Cranial nerves are intact.     Motor: Motor function is intact.     Coordination: Coordination is intact.     Gait: Gait is intact.     Deep Tendon Reflexes: Reflexes are normal and symmetric.     Reflex Scores:      Brachioradialis reflexes are 2+ on the right side and 2+ on the left side.      Patellar reflexes are 2+ on the right side and 2+ on the left side. Psychiatric:        Mood and Affect: Mood normal.        Behavior: Behavior normal. Behavior is cooperative.        Thought Content: Thought content normal.  Judgment: Judgment normal.     Results for orders placed or performed in visit on 01/25/18  Lipid Panel w/o Chol/HDL Ratio  Result Value Ref Range   Cholesterol, Total 191 100 - 199 mg/dL   Triglycerides 137 0 - 149 mg/dL   HDL 46 >39 mg/dL   VLDL Cholesterol Cal 27 5 - 40 mg/dL   LDL Calculated 118 (H) 0 - 99 mg/dL  Comprehensive metabolic panel  Result Value Ref Range   Glucose 125 (H) 65 - 99 mg/dL   BUN 11 6 - 24 mg/dL   Creatinine, Ser 0.89 0.57 - 1.00 mg/dL   GFR calc non Af Amer 72 >59 mL/min/1.73   GFR calc Af Amer 83 >59 mL/min/1.73   BUN/Creatinine Ratio 12 9 - 23   Sodium 144 134 - 144 mmol/L   Potassium 3.2 (L) 3.5  - 5.2 mmol/L   Chloride 104 96 - 106 mmol/L   CO2 26 20 - 29 mmol/L   Calcium 9.5 8.7 - 10.2 mg/dL   Total Protein 7.2 6.0 - 8.5 g/dL   Albumin 4.3 3.5 - 5.5 g/dL   Globulin, Total 2.9 1.5 - 4.5 g/dL   Albumin/Globulin Ratio 1.5 1.2 - 2.2   Bilirubin Total 0.4 0.0 - 1.2 mg/dL   Alkaline Phosphatase 74 39 - 117 IU/L   AST 44 (H) 0 - 40 IU/L   ALT 57 (H) 0 - 32 IU/L      Assessment & Plan:   Problem List Items Addressed This Visit      Cardiovascular and Mediastinum   Benign hypertension    Chronic, ongoing.  Continue current medication regimen.   CBC and CMP today.      Relevant Orders   CBC with Differential/Platelet     Respiratory   Allergic rhinitis    Chronic, stable.  Continue current medication regimen.          Digestive   GERD (gastroesophageal reflux disease)    Chronic, ongoing.  Continue current medication regimen.  Mag level today.      Relevant Orders   Magnesium     Endocrine   IFG (impaired fasting glucose)    On past labs glucose 125 and strong family h/o diabetes.  A1C today.      Relevant Orders   HgB A1c     Other   Hyperlipidemia    Chronic, ongoing.  Continue current medication regimen.  Lipid panel today.      Relevant Orders   Lipid Panel w/o Chol/HDL Ratio   Comprehensive metabolic panel   Elevated transaminase level   Relevant Orders   Comprehensive metabolic panel   Obesity (BMI 30-39.9)    Recommend continued focus on health diet choices and regular physical activity (30 minutes 5 days a week).       Other Visit Diagnoses    Encounter for annual physical exam    -  Primary   Relevant Orders   CBC with Differential/Platelet   TSH       Follow up plan: Return for HTN/HLD.   LABORATORY TESTING:  - Pap smear: up to date, no further cervix had removed per her report  IMMUNIZATIONS:   - Tdap: Tetanus vaccination status reviewed: last tetanus booster within 10 years. - Influenza: Given elsewhere - Pneumovax: Not  applicable - Prevnar: Not applicable - HPV: Not applicable - Zostavax vaccine: Refused  SCREENING: -Mammogram: Up to date  - Colonoscopy: Up to date  - Bone Density: Not applicable  -  Hearing Test: Not applicable  -Spirometry: Not applicable   PATIENT COUNSELING:   Advised to take 1 mg of folate supplement per day if capable of pregnancy.   Sexuality: Discussed sexually transmitted diseases, partner selection, use of condoms, avoidance of unintended pregnancy  and contraceptive alternatives.   Advised to avoid cigarette smoking.  I discussed with the patient that most people either abstain from alcohol or drink within safe limits (<=14/week and <=4 drinks/occasion for males, <=7/weeks and <= 3 drinks/occasion for females) and that the risk for alcohol disorders and other health effects rises proportionally with the number of drinks per week and how often a drinker exceeds daily limits.  Discussed cessation/primary prevention of drug use and availability of treatment for abuse.   Diet: Encouraged to adjust caloric intake to maintain  or achieve ideal body weight, to reduce intake of dietary saturated fat and total fat, to limit sodium intake by avoiding high sodium foods and not adding table salt, and to maintain adequate dietary potassium and calcium preferably from fresh fruits, vegetables, and low-fat dairy products.    stressed the importance of regular exercise  Injury prevention: Discussed safety belts, safety helmets, smoke detector, smoking near bedding or upholstery.   Dental health: Discussed importance of regular tooth brushing, flossing, and dental visits.    NEXT PREVENTATIVE PHYSICAL DUE IN 1 YEAR. Return for HTN/HLD.

## 2018-09-13 NOTE — Assessment & Plan Note (Signed)
Recommend continued focus on health diet choices and regular physical activity (30 minutes 5 days a week). 

## 2018-09-13 NOTE — Patient Instructions (Signed)
Zoster Vaccine, Recombinant injection  What is this medicine?  ZOSTER VACCINE (ZOS ter vak SEEN) is used to prevent shingles in adults 59 years old and over. This vaccine is not used to treat shingles or nerve pain from shingles.  This medicine may be used for other purposes; ask your health care provider or pharmacist if you have questions.  COMMON BRAND NAME(S): SHINGRIX  What should I tell my health care provider before I take this medicine?  They need to know if you have any of these conditions:  -blood disorders or disease  -cancer like leukemia or lymphoma  -immune system problems or therapy  -an unusual or allergic reaction to vaccines, other medications, foods, dyes, or preservatives  -pregnant or trying to get pregnant  -breast-feeding  How should I use this medicine?  This vaccine is for injection in a muscle. It is given by a health care professional.  Talk to your pediatrician regarding the use of this medicine in children. This medicine is not approved for use in children.  Overdosage: If you think you have taken too much of this medicine contact a poison control center or emergency room at once.  NOTE: This medicine is only for you. Do not share this medicine with others.  What if I miss a dose?  Keep appointments for follow-up (booster) doses as directed. It is important not to miss your dose. Call your doctor or health care professional if you are unable to keep an appointment.  What may interact with this medicine?  -medicines that suppress your immune system  -medicines to treat cancer  -steroid medicines like prednisone or cortisone  This list may not describe all possible interactions. Give your health care provider a list of all the medicines, herbs, non-prescription drugs, or dietary supplements you use. Also tell them if you smoke, drink alcohol, or use illegal drugs. Some items may interact with your medicine.  What should I watch for while using this medicine?  Visit your doctor for regular  check ups.  This vaccine, like all vaccines, may not fully protect everyone.  What side effects may I notice from receiving this medicine?  Side effects that you should report to your doctor or health care professional as soon as possible:  -allergic reactions like skin rash, itching or hives, swelling of the face, lips, or tongue  -breathing problems  Side effects that usually do not require medical attention (report these to your doctor or health care professional if they continue or are bothersome):  -chills  -headache  -fever  -nausea, vomiting  -redness, warmth, pain, swelling or itching at site where injected  -tiredness  This list may not describe all possible side effects. Call your doctor for medical advice about side effects. You may report side effects to FDA at 1-800-FDA-1088.  Where should I keep my medicine?  This vaccine is only given in a clinic, pharmacy, doctor's office, or other health care setting and will not be stored at home.  NOTE: This sheet is a summary. It may not cover all possible information. If you have questions about this medicine, talk to your doctor, pharmacist, or health care provider.   2019 Elsevier/Gold Standard (2017-02-05 13:20:30)

## 2018-09-13 NOTE — Assessment & Plan Note (Signed)
On past labs glucose 125 and strong family h/o diabetes.  A1C today.

## 2018-09-13 NOTE — Assessment & Plan Note (Signed)
Chronic, ongoing.  Continue current medication regimen.  Mag level today. 

## 2018-09-13 NOTE — Assessment & Plan Note (Signed)
Chronic, stable.  Continue current medication regimen. 

## 2018-09-13 NOTE — Assessment & Plan Note (Signed)
Chronic, ongoing.  Continue current medication regimen.  Lipid panel today. 

## 2018-09-14 LAB — HEMOGLOBIN A1C
Est. average glucose Bld gHb Est-mCnc: 131 mg/dL
Hgb A1c MFr Bld: 6.2 % — ABNORMAL HIGH (ref 4.8–5.6)

## 2018-09-14 LAB — CBC WITH DIFFERENTIAL/PLATELET
BASOS ABS: 0 10*3/uL (ref 0.0–0.2)
Basos: 1 %
EOS (ABSOLUTE): 0.1 10*3/uL (ref 0.0–0.4)
Eos: 3 %
Hematocrit: 41.5 % (ref 34.0–46.6)
Hemoglobin: 14 g/dL (ref 11.1–15.9)
Immature Grans (Abs): 0 10*3/uL (ref 0.0–0.1)
Immature Granulocytes: 0 %
Lymphocytes Absolute: 1.6 10*3/uL (ref 0.7–3.1)
Lymphs: 41 %
MCH: 29.7 pg (ref 26.6–33.0)
MCHC: 33.7 g/dL (ref 31.5–35.7)
MCV: 88 fL (ref 79–97)
MONOS ABS: 0.3 10*3/uL (ref 0.1–0.9)
Monocytes: 8 %
Neutrophils Absolute: 1.9 10*3/uL (ref 1.4–7.0)
Neutrophils: 47 %
PLATELETS: 292 10*3/uL (ref 150–450)
RBC: 4.71 x10E6/uL (ref 3.77–5.28)
RDW: 13 % (ref 11.7–15.4)
WBC: 4 10*3/uL (ref 3.4–10.8)

## 2018-09-14 LAB — MAGNESIUM: Magnesium: 2.3 mg/dL (ref 1.6–2.3)

## 2018-09-14 LAB — COMPREHENSIVE METABOLIC PANEL
ALK PHOS: 77 IU/L (ref 39–117)
ALT: 58 IU/L — ABNORMAL HIGH (ref 0–32)
AST: 38 IU/L (ref 0–40)
Albumin/Globulin Ratio: 1.8 (ref 1.2–2.2)
Albumin: 4.4 g/dL (ref 3.8–4.9)
BILIRUBIN TOTAL: 0.4 mg/dL (ref 0.0–1.2)
BUN / CREAT RATIO: 11 (ref 9–23)
BUN: 8 mg/dL (ref 6–24)
CHLORIDE: 104 mmol/L (ref 96–106)
CO2: 25 mmol/L (ref 20–29)
Calcium: 9.3 mg/dL (ref 8.7–10.2)
Creatinine, Ser: 0.71 mg/dL (ref 0.57–1.00)
GFR calc Af Amer: 109 mL/min/{1.73_m2} (ref >59)
GFR calc non Af Amer: 94 mL/min/{1.73_m2} (ref >59)
GLOBULIN, TOTAL: 2.4 g/dL (ref 1.5–4.5)
GLUCOSE: 102 mg/dL — AB (ref 65–99)
Potassium: 3.7 mmol/L (ref 3.5–5.2)
SODIUM: 145 mmol/L — AB (ref 134–144)
Total Protein: 6.8 g/dL (ref 6.0–8.5)

## 2018-09-14 LAB — LIPID PANEL W/O CHOL/HDL RATIO
Cholesterol, Total: 166 mg/dL (ref 100–199)
HDL: 49 mg/dL (ref >39)
LDL Calculated: 98 mg/dL (ref 0–99)
TRIGLYCERIDES: 96 mg/dL (ref 0–149)
VLDL Cholesterol Cal: 19 mg/dL (ref 5–40)

## 2018-09-14 LAB — TSH: TSH: 1.69 u[IU]/mL (ref 0.450–4.500)

## 2018-09-20 ENCOUNTER — Ambulatory Visit
Admission: RE | Admit: 2018-09-20 | Discharge: 2018-09-20 | Disposition: A | Discharge status: Home / Self Care | Payer: 59 | Source: Ambulatory Visit | Attending: Nurse Practitioner | Admitting: Nurse Practitioner

## 2018-09-20 ENCOUNTER — Other Ambulatory Visit: Payer: Self-pay

## 2018-09-20 DIAGNOSIS — Z1231 Encounter for screening mammogram for malignant neoplasm of breast: Secondary | ICD-10-CM | POA: Insufficient documentation

## 2018-09-24 ENCOUNTER — Other Ambulatory Visit: Payer: Self-pay | Admitting: Unknown Physician Specialty

## 2018-10-27 DIAGNOSIS — J019 Acute sinusitis, unspecified: Secondary | ICD-10-CM | POA: Diagnosis not present

## 2019-01-02 ENCOUNTER — Other Ambulatory Visit: Payer: Self-pay | Admitting: Unknown Physician Specialty

## 2019-01-30 ENCOUNTER — Ambulatory Visit (INDEPENDENT_AMBULATORY_CARE_PROVIDER_SITE_OTHER): Payer: 59 | Admitting: Nurse Practitioner

## 2019-01-30 ENCOUNTER — Encounter: Payer: Self-pay | Admitting: Nurse Practitioner

## 2019-01-30 ENCOUNTER — Other Ambulatory Visit: Payer: Self-pay

## 2019-01-30 VITALS — Temp 96.6°F

## 2019-01-30 DIAGNOSIS — R42 Dizziness and giddiness: Secondary | ICD-10-CM

## 2019-01-30 MED ORDER — MECLIZINE HCL 25 MG PO TABS: 25.0000 mg | ORAL_TABLET | Freq: Three times a day (TID) | ORAL | 0 refills | Status: AC | PRN

## 2019-01-30 NOTE — Assessment & Plan Note (Signed)
In morning only, has had issues similar on/off for years.  Will obtain labs next week A1C, CMP, CBC, and TSH.  Have come into office in 2 weeks for face to face visit.  Script for Meclizine sent.  Recommend increase hydration and when changing position in morning to do so slowly, due to risk for orthostatic BP on Verapamil.  Return to office sooner if increased or worsening symptoms.

## 2019-01-30 NOTE — Addendum Note (Signed)
Addended by: Marnee Guarneri T on: 01/30/2019 09:30 AM   Modules accepted: Level of Service

## 2019-01-30 NOTE — Progress Notes (Signed)
Temp (!) 96.6 F (35.9 C) (Oral) Comment: pt reported   LMP 10/14/1992 (Approximate)    Subjective:    Patient ID: Linda Boyle, female    DOB: 06-Jul-1961, 58 y.o.   MRN: [id number]  HPI: Linda Boyle is a 57 y.o. female  Chief Complaint  Patient presents with   Dizziness    pt states she gets dizzy in the mornings, states it has happened every day for the last 3 weeks      This visit was completed via telephone due to the restrictions of the COVID-19 pandemic. All issues as above were discussed and addressed but no physical exam was performed. If it was felt that the patient should be evaluated in the office, they were directed there. The patient verbally consented to this visit. Patient was unable to complete an audio/visual visit due to Technical difficulties,Lack of internet. Due to the catastrophic nature of the COVID-19 pandemic, this visit was done through audio contact only.  Location of the patient: home  Location of the provider: home  Those involved with this call:   Provider: Marnee Guarneri, DNP  CMA: Merilyn Baba, [place] Desk/Registration: Jill Side   Time spent on call: 15 minutes on the phone discussing health concerns. 10 minutes total spent in review of patient's record and preparation of their chart.   I verified patient identity using two factors (patient name and date of birth). Patient consents verbally to being seen via telemedicine visit today.    DIZZINESS Reports she gets vertigo, "every now and then it flares up".  At this time whenever she sits up in bed, she has to lay back down because head is spinning.  Has been taking OTC "motion sickness medicine", which she reports is working some.  Has had similar issues with this before but not every day, she read online that "popcorn can cause it".  Had been eating caramel popcorn every day and has stopped eating this, which has helped some.  Often in past when this happened it was in  morning, initially started with these episodes in 1993 after having her daughter.  Has used Meclizine in the past and this worked well for her.  She reports at this time episodes are only in morning and then she has no further episodes throughout day. Duration: weeks Description of symptoms: lightheaded, when sits up in bed Duration of episode: seconds Dizziness frequency: recurrent Provoking factors: sitting up in bed and turning head when lying down  Aggravating factors:  sitting up in bed Triggered by rolling over in bed: no Triggered by bending over: no Aggravated by head movement: yes Aggravated by exertion, coughing, loud noises: no Recent head injury: no Recent or current viral symptoms: no History of vasovagal episodes: no Nausea: no Vomiting: no Tinnitus: no Hearing loss: no Aural fullness: no Headache: no Photophobia/phonophobia: no Unsteady gait: no Postural instability: no Diplopia, dysarthria, dysphagia or weakness: no Related to exertion: no Pallor: no Diaphoresis: no Dyspnea: no Chest pain: no  Relevant past medical, surgical, family and social history reviewed and updated as indicated. Interim medical history since our last visit reviewed. Allergies and medications reviewed and updated.  Review of Systems  Constitutional: Negative for activity change, appetite change, diaphoresis, fatigue and fever.  Respiratory: Negative for cough, chest tightness, shortness of breath and wheezing.   Cardiovascular: Negative for chest pain, palpitations and leg swelling.  Gastrointestinal: Negative for abdominal distention, abdominal pain, constipation, diarrhea, nausea and vomiting.  Neurological: Positive  for dizziness. Negative for syncope, weakness, light-headedness, numbness and headaches.  Psychiatric/Behavioral: Negative.     Per HPI unless specifically indicated above     Objective:    Temp (!) 96.6 F (35.9 C) (Oral) Comment: pt reported   LMP 10/14/1992  (Approximate)   Wt Readings from Last 3 Encounters:  09/13/18 157 lb 9.6 oz (71.5 kg)  01/25/18 154 lb 6.4 oz (70 kg)  09/07/17 160 lb 3.2 oz (72.7 kg)    Physical Exam   Unable to perform due to Doximity video not working on patient phone, telephone visit only.  Results for orders placed or performed in visit on 09/13/18  Lipid Panel w/o Chol/HDL Ratio  Result Value Ref Range   Cholesterol, Total 166 100 - 199 mg/dL   Triglycerides 96 0 - 149 mg/dL   HDL 49 >39 mg/dL   VLDL Cholesterol Cal 19 5 - 40 mg/dL   LDL Calculated 98 0 - 99 mg/dL  Comprehensive metabolic panel  Result Value Ref Range   Glucose 102 (H) 65 - 99 mg/dL   BUN 8 6 - 24 mg/dL   Creatinine, Ser 0.71 0.57 - 1.00 mg/dL   GFR calc non Af Amer 94 >59 mL/min/1.73   GFR calc Af Amer 109 >59 mL/min/1.73   BUN/Creatinine Ratio 11 9 - 23   Sodium 145 (H) 134 - 144 mmol/L   Potassium 3.7 3.5 - 5.2 mmol/L   Chloride 104 96 - 106 mmol/L   CO2 25 20 - 29 mmol/L   Calcium 9.3 8.7 - 10.2 mg/dL   Total Protein 6.8 6.0 - 8.5 g/dL   Albumin 4.4 3.8 - 4.9 g/dL   Globulin, Total 2.4 1.5 - 4.5 g/dL   Albumin/Globulin Ratio 1.8 1.2 - 2.2   Bilirubin Total 0.4 0.0 - 1.2 mg/dL   Alkaline Phosphatase 77 39 - 117 IU/L   AST 38 0 - 40 IU/L   ALT 58 (H) 0 - 32 IU/L  CBC with Differential/Platelet  Result Value Ref Range   WBC 4.0 3.4 - 10.8 x10E3/uL   RBC 4.71 3.77 - 5.28 x10E6/uL   Hemoglobin 14.0 11.1 - 15.9 g/dL   Hematocrit 41.5 34.0 - 46.6 %   MCV 88 79 - 97 fL   MCH 29.7 26.6 - 33.0 pg   MCHC 33.7 31.5 - 35.7 g/dL   RDW 13.0 11.7 - 15.4 %   Platelets 292 150 - 450 x10E3/uL   Neutrophils 47 Not Estab. %   Lymphs 41 Not Estab. %   Monocytes 8 Not Estab. %   Eos 3 Not Estab. %   Basos 1 Not Estab. %   Neutrophils Absolute 1.9 1.4 - 7.0 x10E3/uL   Lymphocytes Absolute 1.6 0.7 - 3.1 x10E3/uL   Monocytes Absolute 0.3 0.1 - 0.9 x10E3/uL   EOS (ABSOLUTE) 0.1 0.0 - 0.4 x10E3/uL   Basophils Absolute 0.0 0.0 - 0.2 x10E3/uL    Immature Granulocytes 0 Not Estab. %   Immature Grans (Abs) 0.0 0.0 - 0.1 x10E3/uL  TSH  Result Value Ref Range   TSH 1.690 0.450 - 4.500 uIU/mL  Magnesium  Result Value Ref Range   Magnesium 2.3 1.6 - 2.3 mg/dL  HgB A1c  Result Value Ref Range   Hgb A1c MFr Bld 6.2 (H) 4.8 - 5.6 %   Est. average glucose Bld gHb Est-mCnc 131 mg/dL      Assessment & Plan:   Problem List Items Addressed This Visit      Other  Dizziness    In morning only, has had issues similar on/off for years.  Will obtain labs next week A1C, CMP, CBC, and TSH.  Have come into office in 2 weeks for face to face visit.  Script for Meclizine sent.  Recommend increase hydration and when changing position in morning to do so slowly, due to risk for orthostatic BP on Verapamil.  Return to office sooner if increased or worsening symptoms.       Other Visit Diagnoses    Vertigo    -  Primary   Relevant Orders   Comprehensive metabolic panel   TSH   CBC with Differential/Platelet   Bayer DCA Hb A1c Waived      I discussed the assessment and treatment plan with the patient. The patient was provided an opportunity to ask questions and all were answered. The patient agreed with the plan and demonstrated an understanding of the instructions.   The patient was advised to call back or seek an in-person evaluation if the symptoms worsen or if the condition fails to improve as anticipated.   I provided 15 minutes of time during this encounter.  Follow up plan: Return in about 2 weeks (around 02/13/2019) for Dizziness.

## 2019-01-30 NOTE — Patient Instructions (Signed)

## 2019-02-05 ENCOUNTER — Other Ambulatory Visit: Payer: Self-pay

## 2019-02-05 DIAGNOSIS — R42 Dizziness and giddiness: Secondary | ICD-10-CM

## 2019-02-05 NOTE — Addendum Note (Signed)
Addended by: Marnee Guarneri T on: 02/05/2019 11:38 AM   Modules accepted: Orders

## 2019-02-06 ENCOUNTER — Ambulatory Visit (INDEPENDENT_AMBULATORY_CARE_PROVIDER_SITE_OTHER): Payer: Self-pay | Admitting: Nurse Practitioner

## 2019-02-06 ENCOUNTER — Encounter: Payer: Self-pay | Admitting: Nurse Practitioner

## 2019-02-06 VITALS — BP 139/88 | HR 71 | Temp 97.1°F | Wt 159.0 lb

## 2019-02-06 DIAGNOSIS — E7800 Pure hypercholesterolemia, unspecified: Secondary | ICD-10-CM

## 2019-02-06 DIAGNOSIS — I10 Essential (primary) hypertension: Secondary | ICD-10-CM

## 2019-02-06 DIAGNOSIS — R42 Dizziness and giddiness: Secondary | ICD-10-CM

## 2019-02-06 LAB — CBC WITH DIFFERENTIAL/PLATELET
Basophils Absolute: 0 10*3/uL (ref 0.0–0.2)
Basos: 1 %
EOS (ABSOLUTE): 0.1 10*3/uL (ref 0.0–0.4)
Eos: 3 %
Hematocrit: 39.9 % (ref 34.0–46.6)
Hemoglobin: 13.6 g/dL (ref 11.1–15.9)
Immature Grans (Abs): 0 10*3/uL (ref 0.0–0.1)
Immature Granulocytes: 0 %
Lymphocytes Absolute: 1.6 10*3/uL (ref 0.7–3.1)
Lymphs: 39 %
MCH: 29.4 pg (ref 26.6–33.0)
MCHC: 34.1 g/dL (ref 31.5–35.7)
MCV: 86 fL (ref 79–97)
Monocytes Absolute: 0.4 10*3/uL (ref 0.1–0.9)
Monocytes: 9 %
Neutrophils Absolute: 1.9 10*3/uL (ref 1.4–7.0)
Neutrophils: 48 %
Platelets: 284 10*3/uL (ref 150–450)
RBC: 4.62 x10E6/uL (ref 3.77–5.28)
RDW: 13.2 % (ref 11.7–15.4)
WBC: 4 10*3/uL (ref 3.4–10.8)

## 2019-02-06 LAB — COMPREHENSIVE METABOLIC PANEL
ALT: 50 IU/L — ABNORMAL HIGH (ref 0–32)
AST: 39 IU/L (ref 0–40)
Albumin/Globulin Ratio: 1.5 (ref 1.2–2.2)
Albumin: 4.1 g/dL (ref 3.8–4.9)
Alkaline Phosphatase: 72 IU/L (ref 39–117)
BUN/Creatinine Ratio: 14 (ref 9–23)
BUN: 11 mg/dL (ref 6–24)
Bilirubin Total: 0.3 mg/dL (ref 0.0–1.2)
CO2: 23 mmol/L (ref 20–29)
Calcium: 9.4 mg/dL (ref 8.7–10.2)
Chloride: 102 mmol/L (ref 96–106)
Creatinine, Ser: 0.81 mg/dL (ref 0.57–1.00)
GFR calc Af Amer: 92 mL/min/{1.73_m2} (ref >59)
GFR calc non Af Amer: 80 mL/min/{1.73_m2} (ref >59)
Globulin, Total: 2.7 g/dL (ref 1.5–4.5)
Glucose: 100 mg/dL — ABNORMAL HIGH (ref 65–99)
Potassium: 3.7 mmol/L (ref 3.5–5.2)
Sodium: 141 mmol/L (ref 134–144)
Total Protein: 6.8 g/dL (ref 6.0–8.5)

## 2019-02-06 LAB — TSH: TSH: 2.48 u[IU]/mL (ref 0.450–4.500)

## 2019-02-06 NOTE — Assessment & Plan Note (Signed)
Improvement noted, happens in morning only upon arising.  Have recommended taking Meclizine as needed only, ordered this way, and not scheduled.  Will have come in office in 2 weeks for follow-up.  Current labs remain within normal limits.

## 2019-02-06 NOTE — Patient Instructions (Signed)

## 2019-02-06 NOTE — Progress Notes (Signed)
BP 139/88 (BP Location: Left Wrist)   Pulse 71   Temp (!) 97.1 F (36.2 C) (Oral)   Wt 159 lb (72.1 kg)   LMP 10/14/1992 (Approximate)   BMI 29.56 kg/m    Subjective:    Patient ID: Linda Boyle, female    DOB: June 30, 1961, 58 y.o.   MRN: [id number]  HPI: GARY BULTMAN is a 58 y.o. female  Chief Complaint  Patient presents with  . Hyperlipidemia  . Hypertension    . This visit was completed via Doximity due to the restrictions of the COVID-19 pandemic. All issues as above were discussed and addressed. Physical exam was done as above through visual confirmation on Doximity. If it was felt that the patient should be evaluated in the office, they were directed there. The patient verbally consented to this visit. . Location of the patient: home . Location of the provider: home . Those involved with this call:  . Provider: Marnee Guarneri, DNP . CMA: Yvonna Alanis, CMA . Front Desk/Registration: Jill Side  . Time spent on call: 15 minutes with patient face to face via video conference. More than 50% of this time was spent in counseling and coordination of care. 10 minutes total spent in review of patient's record and preparation of their chart.  . I verified patient identity using two factors (patient name and date of birth). Patient consents verbally to being seen via telemedicine visit today.    HYPERTENSION / HYPERLIPIDEMIA Continues on Verampamil and Lipitor.  Does endorse some increased stressors as her husband passed away in 11-20-22 from end stage COPD at the age of 78. Satisfied with current treatment? yes Duration of hypertension: chronic BP monitoring frequency: rarely BP range: recently had a 135/81, BP varies at home BP medication side effects: no Duration of hyperlipidemia: chronic Cholesterol medication side effects: no Cholesterol supplements: none Medication compliance: good compliance Aspirin: no Recent stressors: no Recurrent headaches: no  Visual changes: no Palpitations: no Dyspnea: no Chest pain: no Lower extremity edema: no Dizzy/lightheaded: at times in morning   DIZZINESS Reports she gets vertigo, "every now and then it flares up".  Was provided script for Meclizine on 01/30/2019. Often in past when this happened it was in morning, initially started with these episodes in 1993 after having her daughter.  Current episodes are in morning only, none during daytime.  Has been using Meclizine, which has helped, but has been taking three times a day.  Discussed with her that script is for as needed only and to take only if needed, especially in morning if episode present.    Duration: weeks Description of symptoms: lightheaded, when sits up in bed Duration of episode: seconds Dizziness frequency: recurrent Provoking factors: sitting up in bed and turning head when lying down  Aggravating factors:  sitting up in bed Triggered by rolling over in bed: no Triggered by bending over: no Aggravated by head movement: yes Aggravated by exertion, coughing, loud noises: no Recent head injury: no Recent or current viral symptoms: no History of vasovagal episodes: no Nausea: no Vomiting: no Tinnitus: no Hearing loss: no Aural fullness: no Headache: no Photophobia/phonophobia: no Unsteady gait: no Postural instability: no Diplopia, dysarthria, dysphagia or weakness: no Related to exertion: no Pallor: no Diaphoresis: no Dyspnea: no Chest pain: no  Relevant past medical, surgical, family and social history reviewed and updated as indicated. Interim medical history since our last visit reviewed. Allergies and medications reviewed and updated.  Review of Systems  Constitutional:  Negative for activity change, appetite change, diaphoresis, fatigue and fever.  Respiratory: Negative for cough, chest tightness and shortness of breath.   Cardiovascular: Negative for chest pain, palpitations and leg swelling.  Gastrointestinal:  Negative for abdominal distention, abdominal pain, constipation, diarrhea, nausea and vomiting.  Endocrine: Negative for cold intolerance and heat intolerance.  Neurological: Positive for dizziness. Negative for syncope, weakness, light-headedness, numbness and headaches.  Psychiatric/Behavioral: Negative.     Per HPI unless specifically indicated above     Objective:    BP 139/88 (BP Location: Left Wrist)   Pulse 71   Temp (!) 97.1 F (36.2 C) (Oral)   Wt 159 lb (72.1 kg)   LMP 10/14/1992 (Approximate)   BMI 29.56 kg/m   Wt Readings from Last 3 Encounters:  02/06/19 159 lb (72.1 kg)  09/13/18 157 lb 9.6 oz (71.5 kg)  01/25/18 154 lb 6.4 oz (70 kg)    Physical Exam Vitals signs and nursing note reviewed.  Constitutional:      General: She is awake. She is not in acute distress.    Appearance: She is well-developed. She is not ill-appearing.  HENT:     Head: Normocephalic.     Right Ear: Hearing normal.     Left Ear: Hearing normal.  Eyes:     General: Lids are normal.        Right eye: No discharge.        Left eye: No discharge.     Conjunctiva/sclera: Conjunctivae normal.  Neck:     Musculoskeletal: Normal range of motion.  Cardiovascular:     Comments: Unable to auscultate due to virtual exam only  Pulmonary:     Effort: Pulmonary effort is normal. No accessory muscle usage or respiratory distress.     Comments: Unable to auscultate due to virtual exam only  Neurological:     Mental Status: She is alert and oriented to person, place, and time.     Cranial Nerves: Cranial nerves are intact.  Psychiatric:        Attention and Perception: Attention normal.        Mood and Affect: Mood normal.        Speech: Speech normal.        Behavior: Behavior normal. Behavior is cooperative.        Thought Content: Thought content normal.        Judgment: Judgment normal.     Results for orders placed or performed in visit on 02/05/19  Comprehensive metabolic panel   Result Value Ref Range   Glucose 100 (H) 65 - 99 mg/dL   BUN 11 6 - 24 mg/dL   Creatinine, Ser 0.81 0.57 - 1.00 mg/dL   GFR calc non Af Amer 80 >59 mL/min/1.73   GFR calc Af Amer 92 >59 mL/min/1.73   BUN/Creatinine Ratio 14 9 - 23   Sodium 141 134 - 144 mmol/L   Potassium 3.7 3.5 - 5.2 mmol/L   Chloride 102 96 - 106 mmol/L   CO2 23 20 - 29 mmol/L   Calcium 9.4 8.7 - 10.2 mg/dL   Total Protein 6.8 6.0 - 8.5 g/dL   Albumin 4.1 3.8 - 4.9 g/dL   Globulin, Total 2.7 1.5 - 4.5 g/dL   Albumin/Globulin Ratio 1.5 1.2 - 2.2   Bilirubin Total 0.3 0.0 - 1.2 mg/dL   Alkaline Phosphatase 72 39 - 117 IU/L   AST 39 0 - 40 IU/L   ALT 50 (H) 0 - 32 IU/L  CBC with Differential/Platelet  Result Value Ref Range   WBC 4.0 3.4 - 10.8 x10E3/uL   RBC 4.62 3.77 - 5.28 x10E6/uL   Hemoglobin 13.6 11.1 - 15.9 g/dL   Hematocrit 39.9 34.0 - 46.6 %   MCV 86 79 - 97 fL   MCH 29.4 26.6 - 33.0 pg   MCHC 34.1 31.5 - 35.7 g/dL   RDW 13.2 11.7 - 15.4 %   Platelets 284 150 - 450 x10E3/uL   Neutrophils 48 Not Estab. %   Lymphs 39 Not Estab. %   Monocytes 9 Not Estab. %   Eos 3 Not Estab. %   Basos 1 Not Estab. %   Neutrophils Absolute 1.9 1.4 - 7.0 x10E3/uL   Lymphocytes Absolute 1.6 0.7 - 3.1 x10E3/uL   Monocytes Absolute 0.4 0.1 - 0.9 x10E3/uL   EOS (ABSOLUTE) 0.1 0.0 - 0.4 x10E3/uL   Basophils Absolute 0.0 0.0 - 0.2 x10E3/uL   Immature Granulocytes 0 Not Estab. %   Immature Grans (Abs) 0.0 0.0 - 0.1 x10E3/uL  TSH  Result Value Ref Range   TSH 2.480 0.450 - 4.500 uIU/mL      Assessment & Plan:   Problem List Items Addressed This Visit      Cardiovascular and Mediastinum   Benign hypertension - Primary    Chronic, ongoing with initial BP elevated but repeat improved.  Continue current medication regimen and adjust as needed.  Return in 2 weeks to have BP check and orthostatics in office.        Other   Hyperlipidemia    Chronic, ongoing.  Continue current medication regimen and adjust as  needed.  Lipid panel in future when insurance available or able to afford cost of lab.        Dizziness    Improvement noted, happens in morning only upon arising.  Have recommended taking Meclizine as needed only, ordered this way, and not scheduled.  Will have come in office in 2 weeks for follow-up.  Current labs remain within normal limits.        I discussed the assessment and treatment plan with the patient. The patient was provided an opportunity to ask questions and all were answered. The patient agreed with the plan and demonstrated an understanding of the instructions.   The patient was advised to call back or seek an in-person evaluation if the symptoms worsen or if the condition fails to improve as anticipated.   I provided 15 minutes of time during this encounter.   Follow up plan: Return in about 2 weeks (around 02/20/2019) for Dizziness and BP check in office for orthostatics.

## 2019-02-06 NOTE — Assessment & Plan Note (Signed)
Chronic, ongoing with initial BP elevated but repeat improved.  Continue current medication regimen and adjust as needed.  Return in 2 weeks to have BP check and orthostatics in office.

## 2019-02-06 NOTE — Assessment & Plan Note (Signed)
Chronic, ongoing.  Continue current medication regimen and adjust as needed.  Lipid panel in future when insurance available or able to afford cost of lab.

## 2019-02-09 LAB — SPECIMEN STATUS REPORT

## 2019-02-09 LAB — HGB A1C W/O EAG: Hgb A1c MFr Bld: 5.9 % — ABNORMAL HIGH (ref 4.8–5.6)

## 2019-02-21 ENCOUNTER — Other Ambulatory Visit: Payer: Self-pay

## 2019-02-21 ENCOUNTER — Ambulatory Visit (INDEPENDENT_AMBULATORY_CARE_PROVIDER_SITE_OTHER): Payer: Self-pay | Admitting: Nurse Practitioner

## 2019-02-21 ENCOUNTER — Encounter: Payer: Self-pay | Admitting: Nurse Practitioner

## 2019-02-21 VITALS — BP 152/96 | HR 74 | Temp 98.2°F | Ht 61.5 in | Wt 158.0 lb

## 2019-02-21 DIAGNOSIS — R42 Dizziness and giddiness: Secondary | ICD-10-CM

## 2019-02-21 DIAGNOSIS — I10 Essential (primary) hypertension: Secondary | ICD-10-CM

## 2019-02-21 DIAGNOSIS — H6122 Impacted cerumen, left ear: Secondary | ICD-10-CM | POA: Insufficient documentation

## 2019-02-21 MED ORDER — PREDNISONE 10 MG PO TABS: 30.0000 mg | ORAL_TABLET | Freq: Every day | ORAL | 0 refills | Status: AC

## 2019-02-21 MED ORDER — HYDROCHLOROTHIAZIDE 25 MG PO TABS: 25.0000 mg | ORAL_TABLET | Freq: Every day | ORAL | 1 refills | Status: DC

## 2019-02-21 NOTE — Assessment & Plan Note (Signed)
Moderate amount wax removed on exam today, but continued impaction noted.  Referral to ENT.

## 2019-02-21 NOTE — Assessment & Plan Note (Signed)
Chronic, ongoing and not consistently at goal on home BP or in office.  Continue Verapamil and add on HCTZ 25 MG daily.  Continue to perform daily BP at home and document.  Return in 4 weeks for HTN and lab check.

## 2019-02-21 NOTE — Patient Instructions (Addendum)
Debrox over the counter  Earwax Buildup, Adult The ears produce a substance called earwax that helps keep bacteria out of the ear and protects the skin in the ear canal. Occasionally, earwax can build up in the ear and cause discomfort or hearing loss. What increases the risk? This condition is more likely to develop in people who:  Are female.  Are elderly.  Naturally produce more earwax.  Clean their ears often with cotton swabs.  Use earplugs often.  Use in-ear headphones often.  Wear hearing aids.  Have narrow ear canals.  Have earwax that is overly thick or sticky.  Have eczema.  Are dehydrated.  Have excess hair in the ear canal. What are the signs or symptoms? Symptoms of this condition include:  Reduced or muffled hearing.  A feeling of fullness in the ear or feeling that the ear is plugged.  Fluid coming from the ear.  Ear pain.  Ear itch.  Ringing in the ear.  Coughing.  An obvious piece of earwax that can be seen inside the ear canal. How is this diagnosed? This condition may be diagnosed based on:  Your symptoms.  Your medical history.  An ear exam. During the exam, your health care provider will look into your ear with an instrument called an otoscope. You may have tests, including a hearing test. How is this treated? This condition may be treated by:  Using ear drops to soften the earwax.  Having the earwax removed by a health care provider. The health care provider may: ? Flush the ear with water. ? Use an instrument that has a loop on the end (curette). ? Use a suction device.  Surgery to remove the wax buildup. This may be done in severe cases. Follow these instructions at home:   Take over-the-counter and prescription medicines only as told by your health care provider.  Do not put any objects, including cotton swabs, into your ear. You can clean the opening of your ear canal with a washcloth or facial tissue.  Follow  instructions from your health care provider about cleaning your ears. Do not over-clean your ears.  Drink enough fluid to keep your urine clear or pale yellow. This will help to thin the earwax.  Keep all follow-up visits as told by your health care provider. If earwax builds up in your ears often or if you use hearing aids, consider seeing your health care provider for routine, preventive ear cleanings. Ask your health care provider how often you should schedule your cleanings.  If you have hearing aids, clean them according to instructions from the manufacturer and your health care provider. Contact a health care provider if:  You have ear pain.  You develop a fever.  You have blood, pus, or other fluid coming from your ear.  You have hearing loss.  You have ringing in your ears that does not go away.  Your symptoms do not improve with treatment.  You feel like the room is spinning (vertigo). Summary  Earwax can build up in the ear and cause discomfort or hearing loss.  The most common symptoms of this condition include reduced or muffled hearing and a feeling of fullness in the ear or feeling that the ear is plugged.  This condition may be diagnosed based on your symptoms, your medical history, and an ear exam.  This condition may be treated by using ear drops to soften the earwax or by having the earwax removed by a health care provider.  Do not put any objects, including cotton swabs, into your ear. You can clean the opening of your ear canal with a washcloth or facial tissue. This information is not intended to replace advice given to you by your health care provider. Make sure you discuss any questions you have with your health care provider. Document Released: 08/03/2004 Document Revised: 06/08/2017 Document Reviewed: 09/06/2016 Elsevier Patient Education  [place].  Hydrochlorothiazide, HCTZ capsules or tablets What is this medicine? HYDROCHLOROTHIAZIDE (hye  droe klor oh THYE a zide) is a diuretic. It increases the amount of urine passed, which causes the body to lose salt and water. This medicine is used to treat high blood pressure. It is also reduces the swelling and water retention caused by various medical conditions, such as heart, liver, or kidney disease. This medicine may be used for other purposes; ask your health care provider or pharmacist if you have questions. COMMON BRAND NAME(S): Esidrix, Ezide, HydroDIURIL, Microzide, Oretic, Zide What should I tell my health care provider before I take this medicine? They need to know if you have any of these conditions:  diabetes  gout  immune system problems, like lupus  kidney disease or kidney stones  liver disease  pancreatitis  small amount of urine or difficulty passing urine  an unusual or allergic reaction to hydrochlorothiazide, sulfa drugs, other medicines, foods, dyes, or preservatives  pregnant or trying to get pregnant  breast-feeding How should I use this medicine? Take this medicine by mouth with a glass of water. Follow the directions on the prescription label. Take your medicine at regular intervals. Remember that you will need to pass urine frequently after taking this medicine. Do not take your doses at a time of day that will cause you problems. Do not stop taking your medicine unless your doctor tells you to. Talk to your pediatrician regarding the use of this medicine in children. Special care may be needed. Overdosage: If you think you have taken too much of this medicine contact a poison control center or emergency room at once. NOTE: This medicine is only for you. Do not share this medicine with others. What if I miss a dose? If you miss a dose, take it as soon as you can. If it is almost time for your next dose, take only that dose. Do not take double or extra doses. What may interact with this medicine?  cholestyramine  colestipol  digoxin  dofetilide   lithium  medicines for blood pressure  medicines for diabetes  medicines that relax muscles for surgery  other diuretics  steroid medicines like prednisone or cortisone This list may not describe all possible interactions. Give your health care provider a list of all the medicines, herbs, non-prescription drugs, or dietary supplements you use. Also tell them if you smoke, drink alcohol, or use illegal drugs. Some items may interact with your medicine. What should I watch for while using this medicine? Visit your doctor or health care professional for regular checks on your progress. Check your blood pressure as directed. Ask your doctor or health care professional what your blood pressure should be and when you should contact him or her. You may need to be on a special diet while taking this medicine. Ask your doctor. Check with your doctor or health care professional if you get an attack of severe diarrhea, nausea and vomiting, or if you sweat a lot. The loss of too much body fluid can make it dangerous for you to take  this medicine. You may get drowsy or dizzy. Do not drive, use machinery, or do anything that needs mental alertness until you know how this medicine affects you. Do not stand or sit up quickly, especially if you are an older patient. This reduces the risk of dizzy or fainting spells. Alcohol may interfere with the effect of this medicine. Avoid alcoholic drinks. This medicine may increase blood sugar. Ask your healthcare provider if changes in diet or medicines are needed if you have diabetes. This medicine can make you more sensitive to the sun. Keep out of the sun. If you cannot avoid being in the sun, wear protective clothing and use sunscreen. Do not use sun lamps or tanning beds/booths. What side effects may I notice from receiving this medicine? Side effects that you should report to your doctor or health care professional as soon as possible:  allergic reactions such  as skin rash or itching, hives, swelling of the lips, mouth, tongue, or throat  changes in vision  chest pain  eye pain  fast or irregular heartbeat  feeling faint or lightheaded, falls  gout attack  muscle pain or cramps  pain or difficulty when passing urine  pain, tingling, numbness in the hands or feet  redness, blistering, peeling or loosening of the skin, including inside the mouth   signs and symptoms of high blood sugar such as being more thirsty or hungry or having to urinate more than normal. You may also feel very tired or have blurry vision.  unusually weak Side effects that usually do not require medical attention (report to your doctor or health care professional if they continue or are bothersome):  change in sex drive or performance  dry mouth  headache  stomach upset This list may not describe all possible side effects. Call your doctor for medical advice about side effects. You may report side effects to FDA at 1-800-FDA-1088. Where should I keep my medicine? Keep out of the reach of children. Store at room temperature between 15 and 30 degrees C (59 and 86 degrees F). Do not freeze. Protect from light and moisture. Keep container closed tightly. Throw away any unused medicine after the expiration date. NOTE: This sheet is a summary. It may not cover all possible information. If you have questions about this medicine, talk to your doctor, pharmacist, or health care provider.  2020 Elsevier/Gold Standard (2018-04-17 09:25:06)

## 2019-02-21 NOTE — Progress Notes (Signed)
BP (!) 152/96   Pulse 74   Temp 98.2 F (36.8 C) (Oral)   Ht 5' 1.5" (1.562 m)   Wt 158 lb (71.7 kg)   LMP 10/14/1992 (Approximate)   SpO2 97%   BMI 29.37 kg/m    Subjective:    Patient ID: Linda Boyle, female    DOB: 1960-10-13, 58 y.o.   MRN: [id number]  HPI: Linda Boyle is a 58 y.o. female  Chief Complaint  Patient presents with  . Dizziness  . Ear Problem    bilateral discomfort x about a month   DIZZINESS Initially started seen for issue 01/30/19.  Reports she gets vertigo, "every now and then it flares up". Often in past when this happened it was in morning, initially started with these episodes in 1993 after having her daughter. Current episodes are in morning only, none during daytime.  Has been using Meclizine, which has helped some.  Recent labs WNL, except for continued prediabetes, although this number trended downwards.  Today she reports she has had pressure in both ears for one month, which she forgot to mention before.  Denies tinnitus, drainage, or pain.  States it there is a mild discomfort, can not describe this. States they feel like they want to "pop".  Duration:weeks Description of symptoms:lightheaded, when sits up in bed or changes position Duration of episode:seconds Dizziness frequency:recurrent Provoking factors:sitting up in bed and position changes Aggravating factors:sitting up in bed and position changes Triggered by rolling over in bed:no Triggered by bending over:no Aggravated by head movement:yes Aggravated by exertion, coughing, loud noises:no Recent head injury:no Recent or current viral symptoms:no History of vasovagal episodes:no Nausea:no Vomiting:no Tinnitus:no Hearing loss:no Aural fullness:no Headache:no Photophobia/phonophobia:no Unsteady gait:no Postural instability:no Diplopia, dysarthria, dysphagia or weakness:no Related to exertion:no Pallor:no Diaphoresis:no Dyspnea:no  Chest pain:no  HYPERTENSION Takes Verapamil 180 MG daily. Hypertension status: uncontrolled  Satisfied with current treatment? yes Duration of hypertension: chronic BP monitoring frequency:  daily BP range: 123/82 to 166/97 BP medication side effects:  no Medication compliance: good compliance Previous BP meds:Verapamil, HCTZ, Lisinopril, Amlodipine Aspirin: no Recurrent headaches: no Visual changes: no Palpitations: no Dyspnea: no Chest pain: no Lower extremity edema: no Dizzy/lightheaded: no  Relevant past medical, surgical, family and social history reviewed and updated as indicated. Interim medical history since our last visit reviewed. Allergies and medications reviewed and updated.  Review of Systems  Constitutional: Negative for activity change, appetite change, diaphoresis, fatigue and fever.  HENT: Negative for congestion, ear discharge, ear pain, postnasal drip, rhinorrhea, sinus pressure, sinus pain, sneezing and voice change.   Respiratory: Negative for cough, chest tightness, shortness of breath and wheezing.   Cardiovascular: Negative for chest pain, palpitations and leg swelling.  Gastrointestinal: Negative for abdominal distention, abdominal pain, constipation, diarrhea, nausea and vomiting.  Endocrine: Negative for cold intolerance and heat intolerance.  Neurological: Positive for dizziness. Negative for syncope, weakness, light-headedness, numbness and headaches.  Psychiatric/Behavioral: Negative.     Per HPI unless specifically indicated above     Objective:    BP (!) 152/96   Pulse 74   Temp 98.2 F (36.8 C) (Oral)   Ht 5' 1.5" (1.562 m)   Wt 158 lb (71.7 kg)   LMP 10/14/1992 (Approximate)   SpO2 97%   BMI 29.37 kg/m   Wt Readings from Last 3 Encounters:  02/21/19 158 lb (71.7 kg)  02/06/19 159 lb (72.1 kg)  09/13/18 157 lb 9.6 oz (71.5 kg)    Physical Exam  Vitals signs and nursing note reviewed.  Constitutional:      General: She is  awake. She is not in acute distress.    Appearance: She is well-developed. She is not ill-appearing.  HENT:     Head: Normocephalic.     Right Ear: Hearing, ear canal and external ear normal. A middle ear effusion is present.     Left Ear: Hearing normal. There is impacted cerumen.     Ears:     Comments: Left ear with significant cerumen, unable to view TM.  Attempted to flush ear and manually remove, able to get some but continued impaction present and unable to view TM.    Nose: Nose normal.     Right Sinus: No maxillary sinus tenderness or frontal sinus tenderness.     Left Sinus: No maxillary sinus tenderness or frontal sinus tenderness.     Mouth/Throat:     Mouth: Mucous membranes are moist.     Pharynx: Oropharynx is clear. Uvula midline.  Eyes:     General: Lids are normal.        Right eye: No discharge.        Left eye: No discharge.     Conjunctiva/sclera: Conjunctivae normal.     Pupils: Pupils are equal, round, and reactive to light.  Neck:     Musculoskeletal: Normal range of motion and neck supple.     Thyroid: No thyromegaly.     Vascular: No carotid bruit or JVD.  Cardiovascular:     Rate and Rhythm: Normal rate and regular rhythm.     Heart sounds: Normal heart sounds. No murmur. No gallop.   Pulmonary:     Effort: Pulmonary effort is normal. No accessory muscle usage or respiratory distress.     Breath sounds: Normal breath sounds.  Abdominal:     General: Bowel sounds are normal.     Palpations: Abdomen is soft. There is no hepatomegaly or splenomegaly.  Musculoskeletal:     Right lower leg: No edema.     Left lower leg: No edema.  Lymphadenopathy:     Cervical: No cervical adenopathy.  Skin:    General: Skin is warm and dry.  Neurological:     Mental Status: She is alert and oriented to person, place, and time.  Psychiatric:        Attention and Perception: Attention normal.        Mood and Affect: Mood normal.        Speech: Speech normal.         Behavior: Behavior normal. Behavior is cooperative.        Thought Content: Thought content normal.        Judgment: Judgment normal.    Ceruminosis is noted left ear.  Moderate amount wax removed by syringing and manual debridement, however continued impaction and unable to view TM.   Results for orders placed or performed in visit on 02/05/19  Comprehensive metabolic panel  Result Value Ref Range   Glucose 100 (H) 65 - 99 mg/dL   BUN 11 6 - 24 mg/dL   Creatinine, Ser 0.81 0.57 - 1.00 mg/dL   GFR calc non Af Amer 80 >59 mL/min/1.73   GFR calc Af Amer 92 >59 mL/min/1.73   BUN/Creatinine Ratio 14 9 - 23   Sodium 141 134 - 144 mmol/L   Potassium 3.7 3.5 - 5.2 mmol/L   Chloride 102 96 - 106 mmol/L   CO2 23 20 - 29 mmol/L  Calcium 9.4 8.7 - 10.2 mg/dL   Total Protein 6.8 6.0 - 8.5 g/dL   Albumin 4.1 3.8 - 4.9 g/dL   Globulin, Total 2.7 1.5 - 4.5 g/dL   Albumin/Globulin Ratio 1.5 1.2 - 2.2   Bilirubin Total 0.3 0.0 - 1.2 mg/dL   Alkaline Phosphatase 72 39 - 117 IU/L   AST 39 0 - 40 IU/L   ALT 50 (H) 0 - 32 IU/L  CBC with Differential/Platelet  Result Value Ref Range   WBC 4.0 3.4 - 10.8 x10E3/uL   RBC 4.62 3.77 - 5.28 x10E6/uL   Hemoglobin 13.6 11.1 - 15.9 g/dL   Hematocrit 39.9 34.0 - 46.6 %   MCV 86 79 - 97 fL   MCH 29.4 26.6 - 33.0 pg   MCHC 34.1 31.5 - 35.7 g/dL   RDW 13.2 11.7 - 15.4 %   Platelets 284 150 - 450 x10E3/uL   Neutrophils 48 Not Estab. %   Lymphs 39 Not Estab. %   Monocytes 9 Not Estab. %   Eos 3 Not Estab. %   Basos 1 Not Estab. %   Neutrophils Absolute 1.9 1.4 - 7.0 x10E3/uL   Lymphocytes Absolute 1.6 0.7 - 3.1 x10E3/uL   Monocytes Absolute 0.4 0.1 - 0.9 x10E3/uL   EOS (ABSOLUTE) 0.1 0.0 - 0.4 x10E3/uL   Basophils Absolute 0.0 0.0 - 0.2 x10E3/uL   Immature Granulocytes 0 Not Estab. %   Immature Grans (Abs) 0.0 0.0 - 0.1 x10E3/uL  TSH  Result Value Ref Range   TSH 2.480 0.450 - 4.500 uIU/mL  Hgb A1c w/o eAG  Result Value Ref Range   Hgb A1c MFr  Bld 5.9 (H) 4.8 - 5.6 %  Specimen status report  Result Value Ref Range   specimen status report Comment       Assessment & Plan:   Problem List Items Addressed This Visit      Cardiovascular and Mediastinum   Benign hypertension - Primary    Chronic, ongoing and not consistently at goal on home BP or in office.  Continue Verapamil and add on HCTZ 25 MG daily.  Continue to perform daily BP at home and document.  Return in 4 weeks for HTN and lab check.       Relevant Medications   hydrochlorothiazide (HYDRODIURIL) 25 MG tablet     Nervous and Auditory   Ceruminosis, left    Moderate amount wax removed on exam today, but continued impaction noted.  Referral to ENT.        Relevant Orders   Ambulatory referral to ENT     Other   Dizziness    Suspect related eustachian tube dysfunction and ceruminosis.  Script for Prednisone 30 MG x 5 days and referral to ENT for further assessment.  Continue Meclizine as needed.  Recommend continued non use of Qtips at home, for cleaning may benefit from Debrox.  Return to office for worsening or continued issues.      Relevant Orders   Ambulatory referral to ENT      Time: 25 minutes, >50% spent with procedure (ear lavage)  Follow up plan: Return in about 4 weeks (around 03/21/2019) for HTN follow-up.

## 2019-02-21 NOTE — Assessment & Plan Note (Signed)
Suspect related eustachian tube dysfunction and ceruminosis.  Script for Prednisone 30 MG x 5 days and referral to ENT for further assessment.  Continue Meclizine as needed.  Recommend continued non use of Qtips at home, for cleaning may benefit from Debrox.  Return to office for worsening or continued issues.

## 2019-03-24 ENCOUNTER — Ambulatory Visit: Payer: Self-pay | Admitting: Nurse Practitioner

## 2019-03-28 ENCOUNTER — Other Ambulatory Visit: Payer: Self-pay

## 2019-03-28 ENCOUNTER — Encounter: Payer: Self-pay | Admitting: Nurse Practitioner

## 2019-03-28 ENCOUNTER — Ambulatory Visit (INDEPENDENT_AMBULATORY_CARE_PROVIDER_SITE_OTHER): Payer: Self-pay | Admitting: Nurse Practitioner

## 2019-03-28 VITALS — BP 123/87 | HR 77 | Temp 98.6°F

## 2019-03-28 DIAGNOSIS — E7800 Pure hypercholesterolemia, unspecified: Secondary | ICD-10-CM

## 2019-03-28 DIAGNOSIS — I10 Essential (primary) hypertension: Secondary | ICD-10-CM

## 2019-03-28 NOTE — Assessment & Plan Note (Signed)
Chronic, ongoing, and improved with addition of HCTZ.  BP at goal in office today.  Recommend she continue to check BP at least 3 days at week at home and document.  Continue current medication regimen. Obtain BMP today.  Return in 6 months.

## 2019-03-28 NOTE — Progress Notes (Signed)
BP 123/87   Pulse 77   Temp 98.6 F (37 C) (Oral)   LMP 10/14/1992 (Approximate)   SpO2 97%    Subjective:    Patient ID: Linda Boyle, female    DOB: 29-Oct-1960, 58 y.o.   MRN: [id number]  HPI: Linda Boyle is a 58 y.o. female  Chief Complaint  Patient presents with  . Hyperlipidemia  . Hypertension   HYPERTENSION / HYPERLIPIDEMIA Last visit added on small dose HCTZ.   Satisfied with current treatment? yes Duration of hypertension: chronic BP monitoring frequency: not checking BP range:  BP medication side effects: no Duration of hyperlipidemia: chronic Cholesterol medication side effects: yes Cholesterol supplements: none Medication compliance: good compliance Aspirin: no Recent stressors: no Recurrent headaches: no Visual changes: no Palpitations: no Dyspnea: no Chest pain: no Lower extremity edema: no Dizzy/lightheaded: no  Relevant past medical, surgical, family and social history reviewed and updated as indicated. Interim medical history since our last visit reviewed. Allergies and medications reviewed and updated.  Review of Systems  Constitutional: Negative for activity change, appetite change, diaphoresis, fatigue and fever.  Respiratory: Negative for cough, chest tightness and shortness of breath.   Cardiovascular: Negative for chest pain, palpitations and leg swelling.  Gastrointestinal: Negative for abdominal distention, abdominal pain, constipation, diarrhea, nausea and vomiting.  Neurological: Negative for dizziness, syncope, weakness, light-headedness, numbness and headaches.  Psychiatric/Behavioral: Negative.     Per HPI unless specifically indicated above     Objective:    BP 123/87   Pulse 77   Temp 98.6 F (37 C) (Oral)   LMP 10/14/1992 (Approximate)   SpO2 97%   Wt Readings from Last 3 Encounters:  02/21/19 158 lb (71.7 kg)  02/06/19 159 lb (72.1 kg)  09/13/18 157 lb 9.6 oz (71.5 kg)    Physical Exam Vitals signs and  nursing note reviewed.  Constitutional:      General: She is awake. She is not in acute distress.    Appearance: She is well-developed and overweight. She is not ill-appearing.  HENT:     Head: Normocephalic.     Right Ear: Hearing normal.     Left Ear: Hearing normal.  Eyes:     General: Lids are normal.        Right eye: No discharge.        Left eye: No discharge.     Conjunctiva/sclera: Conjunctivae normal.     Pupils: Pupils are equal, round, and reactive to light.  Neck:     Musculoskeletal: Normal range of motion and neck supple.     Vascular: No carotid bruit.  Cardiovascular:     Rate and Rhythm: Normal rate and regular rhythm.     Heart sounds: Normal heart sounds. No murmur. No gallop.   Pulmonary:     Effort: Pulmonary effort is normal. No accessory muscle usage or respiratory distress.     Breath sounds: Normal breath sounds.  Abdominal:     General: Bowel sounds are normal.     Palpations: Abdomen is soft.  Musculoskeletal:     Right lower leg: No edema.     Left lower leg: No edema.  Skin:    General: Skin is warm and dry.  Neurological:     Mental Status: She is alert and oriented to person, place, and time.  Psychiatric:        Attention and Perception: Attention normal.        Mood and Affect: Mood normal.  Behavior: Behavior normal. Behavior is cooperative.        Thought Content: Thought content normal.        Judgment: Judgment normal.     Results for orders placed or performed in visit on 02/05/19  Comprehensive metabolic panel  Result Value Ref Range   Glucose 100 (H) 65 - 99 mg/dL   BUN 11 6 - 24 mg/dL   Creatinine, Ser 0.81 0.57 - 1.00 mg/dL   GFR calc non Af Amer 80 >59 mL/min/1.73   GFR calc Af Amer 92 >59 mL/min/1.73   BUN/Creatinine Ratio 14 9 - 23   Sodium 141 134 - 144 mmol/L   Potassium 3.7 3.5 - 5.2 mmol/L   Chloride 102 96 - 106 mmol/L   CO2 23 20 - 29 mmol/L   Calcium 9.4 8.7 - 10.2 mg/dL   Total Protein 6.8 6.0 - 8.5  g/dL   Albumin 4.1 3.8 - 4.9 g/dL   Globulin, Total 2.7 1.5 - 4.5 g/dL   Albumin/Globulin Ratio 1.5 1.2 - 2.2   Bilirubin Total 0.3 0.0 - 1.2 mg/dL   Alkaline Phosphatase 72 39 - 117 IU/L   AST 39 0 - 40 IU/L   ALT 50 (H) 0 - 32 IU/L  CBC with Differential/Platelet  Result Value Ref Range   WBC 4.0 3.4 - 10.8 x10E3/uL   RBC 4.62 3.77 - 5.28 x10E6/uL   Hemoglobin 13.6 11.1 - 15.9 g/dL   Hematocrit 39.9 34.0 - 46.6 %   MCV 86 79 - 97 fL   MCH 29.4 26.6 - 33.0 pg   MCHC 34.1 31.5 - 35.7 g/dL   RDW 13.2 11.7 - 15.4 %   Platelets 284 150 - 450 x10E3/uL   Neutrophils 48 Not Estab. %   Lymphs 39 Not Estab. %   Monocytes 9 Not Estab. %   Eos 3 Not Estab. %   Basos 1 Not Estab. %   Neutrophils Absolute 1.9 1.4 - 7.0 x10E3/uL   Lymphocytes Absolute 1.6 0.7 - 3.1 x10E3/uL   Monocytes Absolute 0.4 0.1 - 0.9 x10E3/uL   EOS (ABSOLUTE) 0.1 0.0 - 0.4 x10E3/uL   Basophils Absolute 0.0 0.0 - 0.2 x10E3/uL   Immature Granulocytes 0 Not Estab. %   Immature Grans (Abs) 0.0 0.0 - 0.1 x10E3/uL  TSH  Result Value Ref Range   TSH 2.480 0.450 - 4.500 uIU/mL  Hgb A1c w/o eAG  Result Value Ref Range   Hgb A1c MFr Bld 5.9 (H) 4.8 - 5.6 %  Specimen status report  Result Value Ref Range   specimen status report Comment       Assessment & Plan:   Problem List Items Addressed This Visit      Cardiovascular and Mediastinum   Benign hypertension - Primary    Chronic, ongoing, and improved with addition of HCTZ.  BP at goal in office today.  Recommend she continue to check BP at least 3 days at week at home and document.  Continue current medication regimen. Obtain BMP today.  Return in 6 months.      Relevant Orders   Basic Metabolic Panel (BMET)     Other   Hyperlipidemia    Chronic, ongoing.  Continue current medication regimen and adjust as needed.  She is fasting today, will obtain lipid panel.  Return in 6 months.      Relevant Orders   Lipid Panel w/o Chol/HDL Ratio       Follow up  plan: Return in about  6 months (around 09/25/2019) for HTN/HLD, IFG.

## 2019-03-28 NOTE — Assessment & Plan Note (Signed)
Chronic, ongoing.  Continue current medication regimen and adjust as needed.  She is fasting today, will obtain lipid panel.  Return in 6 months.

## 2019-03-28 NOTE — Patient Instructions (Signed)
Menopause Menopause is the normal time of life when menstrual periods stop completely. It is usually confirmed by 12 months without a menstrual period. The transition to menopause (perimenopause) most often happens between the ages of 45 and 55. During perimenopause, hormone levels change in your body, which can cause symptoms and affect your health. Menopause may increase your risk for:  Loss of bone (osteoporosis), which causes bone breaks (fractures).  Depression.  Hardening and narrowing of the arteries (atherosclerosis), which can cause heart attacks and strokes. What are the causes? This condition is usually caused by a natural change in hormone levels that happens as you get older. The condition may also be caused by surgery to remove both ovaries (bilateral oophorectomy). What increases the risk? This condition is more likely to start at an earlier age if you have certain medical conditions or treatments, including:  A tumor of the pituitary gland in the brain.  A disease that affects the ovaries and hormone production.  Radiation treatment for cancer.  Certain cancer treatments, such as chemotherapy or hormone (anti-estrogen) therapy.  Heavy smoking and excessive alcohol use.  Family history of early menopause. This condition is also more likely to develop earlier in women who are very thin. What are the signs or symptoms? Symptoms of this condition include:  Hot flashes.  Irregular menstrual periods.  Night sweats.  Changes in feelings about sex. This could be a decrease in sex drive or an increased comfort around your sexuality.  Vaginal dryness and thinning of the vaginal walls. This may cause painful intercourse.  Dryness of the skin and development of wrinkles.  Headaches.  Problems sleeping (insomnia).  Mood swings or irritability.  Memory problems.  Weight gain.  Hair growth on the face and chest.  Bladder infections or problems with urinating. How  is this diagnosed? This condition is diagnosed based on your medical history, a physical exam, your age, your menstrual history, and your symptoms. Hormone tests may also be done. How is this treated? In some cases, no treatment is needed. You and your health care provider should make a decision together about whether treatment is necessary. Treatment will be based on your individual condition and preferences. Treatment for this condition focuses on managing symptoms. Treatment may include:  Menopausal hormone therapy (MHT).  Medicines to treat specific symptoms or complications.  Acupuncture.  Vitamin or herbal supplements. Before starting treatment, make sure to let your health care provider know if you have a personal or family history of:  Heart disease.  Breast cancer.  Blood clots.  Diabetes.  Osteoporosis. Follow these instructions at home: Lifestyle  Do not use any products that contain nicotine or tobacco, such as cigarettes and e-cigarettes. If you need help quitting, ask your health care provider.  Get at least 30 minutes of physical activity on 5 or more days each week.  Avoid alcoholic and caffeinated beverages, as well as spicy foods. This may help prevent hot flashes.  Get 7-8 hours of sleep each night.  If you have hot flashes, try: ? Dressing in layers. ? Avoiding things that may trigger hot flashes, such as spicy food, warm places, or stress. ? Taking slow, deep breaths when a hot flash starts. ? Keeping a fan in your home and office.  Find ways to manage stress, such as deep breathing, meditation, or journaling.  Consider going to group therapy with other women who are having menopause symptoms. Ask your health care provider about recommended group therapy meetings. Eating and   drinking  Eat a healthy, balanced diet that contains whole grains, lean protein, low-fat dairy, and plenty of fruits and vegetables.  Your health care provider may recommend  adding more soy to your diet. Foods that contain soy include tofu, tempeh, and soy milk.  Eat plenty of foods that contain calcium and vitamin D for bone health. Items that are rich in calcium include low-fat milk, yogurt, beans, almonds, sardines, broccoli, and kale. Medicines  Take over-the-counter and prescription medicines only as told by your health care provider.  Talk with your health care provider before starting any herbal supplements. If prescribed, take vitamins and supplements as told by your health care provider. These may include: ? Calcium. Women age 82 and older should get 1,200 mg (milligrams) of calcium every day. ? Vitamin D. Women need 600-800 International Units of vitamin D each day. ? Vitamins B12 and B6. Aim for 50 micrograms of B12 and 1.5 mg of B6 each day. General instructions  Keep track of your menstrual periods, including: ? When they occur. ? How heavy they are and how long they last. ? How much time passes between periods.  Keep track of your symptoms, noting when they start, how often you have them, and how long they last.  Use vaginal lubricants or moisturizers to help with vaginal dryness and improve comfort during sex.  Keep all follow-up visits as told by your health care provider. This is important. This includes any group therapy or counseling. Contact a health care provider if:  You are still having menstrual periods after age 31.  You have pain during sex.  You have not had a period for 12 months and you develop vaginal bleeding. Get help right away if:  You have: ? Severe depression. ? Excessive vaginal bleeding. ? Pain when you urinate. ? A fast or irregular heart beat (palpitations). ? Severe headaches. ? Abdomen (abdominal) pain or severe indigestion.  You fell and you think you have a broken bone.  You develop leg or chest pain.  You develop vision problems.  You feel a lump in your breast. Summary  Menopause is the normal  time of life when menstrual periods stop completely. It is usually confirmed by 12 months without a menstrual period.  The transition to menopause (perimenopause) most often happens between the ages of 41 and 65.  Symptoms can be managed through medicines, lifestyle changes, and complementary therapies such as acupuncture.  Eat a balanced diet that is rich in nutrients to promote bone health and heart health and to manage symptoms during menopause. This information is not intended to replace advice given to you by your health care provider. Make sure you discuss any questions you have with your health care provider. Document Released: 09/16/2003 Document Revised: 06/08/2017 Document Reviewed: 07/29/2016 Elsevier Patient Education  2020 [place] DASH stands for "Dietary Approaches to Stop Hypertension." The DASH eating plan is a healthy eating plan that has been shown to reduce high blood pressure (hypertension). It may also reduce your risk for type 2 diabetes, heart disease, and stroke. The DASH eating plan may also help with weight loss. What are tips for following this plan?  General guidelines  Avoid eating more than 2,300 mg (milligrams) of salt (sodium) a day. If you have hypertension, you may need to reduce your sodium intake to 1,500 mg a day.  Limit alcohol intake to no more than 1 drink a day for nonpregnant women and 2 drinks a day for  men. One drink equals 12 oz of beer, 5 oz of wine, or 1 oz of hard liquor.  Work with your health care provider to maintain a healthy body weight or to lose weight. Ask what an ideal weight is for you.  Get at least 30 minutes of exercise that causes your heart to beat faster (aerobic exercise) most days of the week. Activities may include walking, swimming, or biking.  Work with your health care provider or diet and nutrition specialist (dietitian) to adjust your eating plan to your individual calorie needs. Reading food  labels   Check food labels for the amount of sodium per serving. Choose foods with less than 5 percent of the Daily Value of sodium. Generally, foods with less than 300 mg of sodium per serving fit into this eating plan.  To find whole grains, look for the word "whole" as the first word in the ingredient list. Shopping  Buy products labeled as "low-sodium" or "no salt added."  Buy fresh foods. Avoid canned foods and premade or frozen meals. Cooking  Avoid adding salt when cooking. Use salt-free seasonings or herbs instead of table salt or sea salt. Check with your health care provider or pharmacist before using salt substitutes.  Do not fry foods. Cook foods using healthy methods such as baking, boiling, grilling, and broiling instead.  Cook with heart-healthy oils, such as olive, canola, soybean, or sunflower oil. Meal planning  Eat a balanced diet that includes: ? 5 or more servings of fruits and vegetables each day. At each meal, try to fill half of your plate with fruits and vegetables. ? Up to 6-8 servings of whole grains each day. ? Less than 6 oz of lean meat, poultry, or fish each day. A 3-oz serving of meat is about the same size as a deck of cards. One egg equals 1 oz. ? 2 servings of low-fat dairy each day. ? A serving of nuts, seeds, or beans 5 times each week. ? Heart-healthy fats. Healthy fats called Omega-3 fatty acids are found in foods such as flaxseeds and coldwater fish, like sardines, salmon, and mackerel.  Limit how much you eat of the following: ? Canned or prepackaged foods. ? Food that is high in trans fat, such as fried foods. ? Food that is high in saturated fat, such as fatty meat. ? Sweets, desserts, sugary drinks, and other foods with added sugar. ? Full-fat dairy products.  Do not salt foods before eating.  Try to eat at least 2 vegetarian meals each week.  Eat more home-cooked food and less restaurant, buffet, and fast food.  When eating at a  restaurant, ask that your food be prepared with less salt or no salt, if possible. What foods are recommended? The items listed may not be a complete list. Talk with your dietitian about what dietary choices are best for you. Grains Whole-grain or whole-wheat bread. Whole-grain or whole-wheat pasta. Brown rice. [place]. Bulgur. Whole-grain and low-sodium cereals. Pita bread. Low-fat, low-sodium crackers. Whole-wheat flour tortillas. Vegetables Fresh or frozen vegetables (raw, steamed, roasted, or grilled). Low-sodium or reduced-sodium tomato and vegetable juice. Low-sodium or reduced-sodium tomato sauce and tomato paste. Low-sodium or reduced-sodium canned vegetables. Fruits All fresh, dried, or frozen fruit. Canned fruit in natural juice (without added sugar). Meat and other protein foods Skinless chicken or [place]. Ground chicken or [place]. Pork with fat trimmed off. Fish and seafood. Egg whites. Dried beans, peas, or lentils. Unsalted nuts, nut butters, and seeds. Unsalted canned  beans. Lean cuts of beef with fat trimmed off. Low-sodium, lean deli meat. Dairy Low-fat (1%) or fat-free (skim) milk. Fat-free, low-fat, or reduced-fat cheeses. Nonfat, low-sodium ricotta or cottage cheese. Low-fat or nonfat yogurt. Low-fat, low-sodium cheese. Fats and oils Soft margarine without trans fats. Vegetable oil. Low-fat, reduced-fat, or light mayonnaise and salad dressings (reduced-sodium). Canola, safflower, olive, soybean, and sunflower oils. Avocado. Seasoning and other foods Herbs. Spices. Seasoning mixes without salt. Unsalted popcorn and pretzels. Fat-free sweets. What foods are not recommended? The items listed may not be a complete list. Talk with your dietitian about what dietary choices are best for you. Grains Baked goods made with fat, such as croissants, muffins, or some breads. Dry pasta or rice meal packs. Vegetables Creamed or fried vegetables. Vegetables in a cheese sauce.  Regular canned vegetables (not low-sodium or reduced-sodium). Regular canned tomato sauce and paste (not low-sodium or reduced-sodium). Regular tomato and vegetable juice (not low-sodium or reduced-sodium). Angie Fava. Olives. Fruits Canned fruit in a light or heavy syrup. Fried fruit. Fruit in cream or butter sauce. Meat and other protein foods Fatty cuts of meat. Ribs. Fried meat. Berniece Salines. Sausage. Bologna and other processed lunch meats. Salami. Fatback. Hotdogs. Bratwurst. Salted nuts and seeds. Canned beans with added salt. Canned or smoked fish. Whole eggs or egg yolks. Chicken or [place] with skin. Dairy Whole or 2% milk, cream, and half-and-half. Whole or full-fat cream cheese. Whole-fat or sweetened yogurt. Full-fat cheese. Nondairy creamers. Whipped toppings. Processed cheese and cheese spreads. Fats and oils Butter. Stick margarine. Lard. Shortening. Ghee. Bacon fat. Tropical oils, such as coconut, palm kernel, or palm oil. Seasoning and other foods Salted popcorn and pretzels. Onion salt, garlic salt, seasoned salt, table salt, and sea salt. Worcestershire sauce. Tartar sauce. Barbecue sauce. Teriyaki sauce. Soy sauce, including reduced-sodium. Steak sauce. Canned and packaged gravies. Fish sauce. Oyster sauce. Cocktail sauce. Horseradish that you find on the shelf. Ketchup. Mustard. Meat flavorings and tenderizers. Bouillon cubes. Hot sauce and Tabasco sauce. Premade or packaged marinades. Premade or packaged taco seasonings. Relishes. Regular salad dressings. Where to find more information:  National Heart, Lung, and [place]: https://wilson-eaton.com/  American Heart Association: www.heart.org Summary  The DASH eating plan is a healthy eating plan that has been shown to reduce high blood pressure (hypertension). It may also reduce your risk for type 2 diabetes, heart disease, and stroke.  With the DASH eating plan, you should limit salt (sodium) intake to 2,300 mg a day. If you have  hypertension, you may need to reduce your sodium intake to 1,500 mg a day.  When on the DASH eating plan, aim to eat more fresh fruits and vegetables, whole grains, lean proteins, low-fat dairy, and heart-healthy fats.  Work with your health care provider or diet and nutrition specialist (dietitian) to adjust your eating plan to your individual calorie needs. This information is not intended to replace advice given to you by your health care provider. Make sure you discuss any questions you have with your health care provider. Document Released: 06/15/2011 Document Revised: 06/08/2017 Document Reviewed: 06/19/2016 Elsevier Patient Education  2020 [employer].

## 2019-03-29 LAB — BASIC METABOLIC PANEL
BUN/Creatinine Ratio: 14 (ref 9–23)
BUN: 14 mg/dL (ref 6–24)
CO2: 26 mmol/L (ref 20–29)
Calcium: 9.6 mg/dL (ref 8.7–10.2)
Chloride: 100 mmol/L (ref 96–106)
Creatinine, Ser: 1.01 mg/dL — ABNORMAL HIGH (ref 0.57–1.00)
GFR calc Af Amer: 70 mL/min/{1.73_m2} (ref >59)
GFR calc non Af Amer: 61 mL/min/{1.73_m2} (ref >59)
Glucose: 122 mg/dL — ABNORMAL HIGH (ref 65–99)
Potassium: 3 mmol/L — ABNORMAL LOW (ref 3.5–5.2)
Sodium: 141 mmol/L (ref 134–144)

## 2019-03-29 LAB — LIPID PANEL W/O CHOL/HDL RATIO
Cholesterol, Total: 189 mg/dL (ref 100–199)
HDL: 42 mg/dL (ref >39)
LDL Chol Calc (NIH): 118 mg/dL — ABNORMAL HIGH (ref 0–99)
Triglycerides: 163 mg/dL — ABNORMAL HIGH (ref 0–149)
VLDL Cholesterol Cal: 29 mg/dL (ref 5–40)

## 2019-04-01 ENCOUNTER — Telehealth: Payer: Self-pay | Admitting: Nurse Practitioner

## 2019-04-01 DIAGNOSIS — I10 Essential (primary) hypertension: Secondary | ICD-10-CM

## 2019-04-01 NOTE — Telephone Encounter (Signed)
Spoke to patient via telephone, she did not receive MyChart messages.  Discussed that she would need to stop taking HCTZ, as it pushed her potassium level down.  Would like to recheck this in 1-2 weeks without HCTZ on board.  Discussed with her to continue to monitor BP daily and will have staff call and set follow-up for a couple months to determine whether a need for additional BP medication.  Also review her cholesterol labs and discussed LDL elevation, to continue Lipitor daily.

## 2019-04-01 NOTE — Telephone Encounter (Signed)
Lab appointment and 2 month F/U scheduled.

## 2019-04-08 ENCOUNTER — Other Ambulatory Visit: Payer: Self-pay

## 2019-04-08 DIAGNOSIS — I10 Essential (primary) hypertension: Secondary | ICD-10-CM

## 2019-04-09 LAB — BASIC METABOLIC PANEL
BUN/Creatinine Ratio: 13 (ref 9–23)
BUN: 10 mg/dL (ref 6–24)
CO2: 23 mmol/L (ref 20–29)
Calcium: 9.2 mg/dL (ref 8.7–10.2)
Chloride: 105 mmol/L (ref 96–106)
Creatinine, Ser: 0.75 mg/dL (ref 0.57–1.00)
GFR calc Af Amer: 101 mL/min/{1.73_m2} (ref >59)
GFR calc non Af Amer: 88 mL/min/{1.73_m2} (ref >59)
Glucose: 108 mg/dL — ABNORMAL HIGH (ref 65–99)
Potassium: 3.5 mmol/L (ref 3.5–5.2)
Sodium: 141 mmol/L (ref 134–144)

## 2019-05-28 ENCOUNTER — Ambulatory Visit (INDEPENDENT_AMBULATORY_CARE_PROVIDER_SITE_OTHER): Payer: Self-pay | Admitting: Family Medicine

## 2019-05-28 ENCOUNTER — Encounter: Payer: Self-pay | Admitting: Family Medicine

## 2019-05-28 ENCOUNTER — Other Ambulatory Visit: Payer: Self-pay

## 2019-05-28 VITALS — BP 134/86 | HR 74 | Temp 98.3°F | Ht 61.0 in | Wt 161.0 lb

## 2019-05-28 DIAGNOSIS — I10 Essential (primary) hypertension: Secondary | ICD-10-CM

## 2019-05-28 DIAGNOSIS — E7800 Pure hypercholesterolemia, unspecified: Secondary | ICD-10-CM

## 2019-05-28 LAB — MICROALBUMIN, URINE WAIVED
Creatinine, Urine Waived: 200 mg/dL (ref 10–300)
Microalb, Ur Waived: 80 mg/L — ABNORMAL HIGH (ref 0–19)

## 2019-05-28 MED ORDER — VERAPAMIL HCL ER 180 MG PO CP24: 180.0000 mg | ORAL_CAPSULE | Freq: Every day | ORAL | 1 refills | Status: DC

## 2019-05-28 MED ORDER — OMEPRAZOLE 20 MG PO CPDR: 20.0000 mg | DELAYED_RELEASE_CAPSULE | Freq: Every day | ORAL | 1 refills | Status: DC

## 2019-05-28 MED ORDER — FLUTICASONE PROPIONATE 50 MCG/ACT NA SUSP: 2.0000 | Freq: Every day | NASAL | 12 refills | Status: DC

## 2019-05-28 MED ORDER — ATORVASTATIN CALCIUM 20 MG PO TABS: 20.0000 mg | ORAL_TABLET | Freq: Every day | ORAL | 1 refills | Status: DC

## 2019-05-28 NOTE — Assessment & Plan Note (Signed)
Under good control on current regimen. Continue current regimen. Continue to monitor. Call with any concerns. Refills given. Labs drawn today. Recheck 6 months.

## 2019-05-28 NOTE — Progress Notes (Signed)
BP 134/86   Pulse 74   Temp 98.3 F (36.8 C) (Oral)   Ht _0  (1.549 m)   Wt 161 lb (73 kg)   LMP 10/14/1992 (Approximate)   SpO2 97%   BMI 30.42 kg/m    Subjective:    Patient ID: Linda Boyle, female    DOB: May 09, 1961, 58 y.o.   MRN: [id number]  HPI: Linda Boyle is a 58 y.o. female  Chief Complaint  Patient presents with  . Hypertension  . Hyperlipidemia   HYPERTENSION / HYPERLIPIDEMIA Satisfied with current treatment? yes Duration of hypertension: chronic BP monitoring frequency: not checking BP medication side effects: no Past BP meds: verapamil Duration of hyperlipidemia: chronic Cholesterol medication side effects: no Cholesterol supplements: none Past cholesterol medications: atorvastatin Medication compliance: excellent compliance Aspirin: no Recent stressors: no Recurrent headaches: no Visual changes: no Palpitations: no Dyspnea: no Chest pain: no Lower extremity edema: no Dizzy/lightheaded: no  Relevant past medical, surgical, family and social history reviewed and updated as indicated. Interim medical history since our last visit reviewed. Allergies and medications reviewed and updated.  Review of Systems  Constitutional: Negative.   Respiratory: Negative.   Cardiovascular: Negative.   Musculoskeletal: Negative.   Psychiatric/Behavioral: Negative.     Per HPI unless specifically indicated above     Objective:    BP 134/86   Pulse 74   Temp 98.3 F (36.8 C) (Oral)   Ht _1  (1.549 m)   Wt 161 lb (73 kg)   LMP 10/14/1992 (Approximate)   SpO2 97%   BMI 30.42 kg/m   Wt Readings from Last 3 Encounters:  05/28/19 161 lb (73 kg)  02/21/19 158 lb (71.7 kg)  02/06/19 159 lb (72.1 kg)    Physical Exam Vitals signs and nursing note reviewed.  Constitutional:      General: She is not in acute distress.    Appearance: Normal appearance. She is not ill-appearing, toxic-appearing or diaphoretic.  HENT:     Head:  Normocephalic and atraumatic.     Right Ear: External ear normal.     Left Ear: External ear normal.     Nose: Nose normal.     Mouth/Throat:     Mouth: Mucous membranes are moist.     Pharynx: Oropharynx is clear.  Eyes:     General: No scleral icterus.       Right eye: No discharge.        Left eye: No discharge.     Extraocular Movements: Extraocular movements intact.     Conjunctiva/sclera: Conjunctivae normal.     Pupils: Pupils are equal, round, and reactive to light.  Neck:     Musculoskeletal: Normal range of motion and neck supple.  Cardiovascular:     Rate and Rhythm: Normal rate and regular rhythm.     Pulses: Normal pulses.     Heart sounds: Normal heart sounds. No murmur. No friction rub. No gallop.   Pulmonary:     Effort: Pulmonary effort is normal. No respiratory distress.     Breath sounds: Normal breath sounds. No stridor. No wheezing, rhonchi or rales.  Chest:     Chest wall: No tenderness.  Musculoskeletal: Normal range of motion.  Skin:    General: Skin is warm and dry.     Capillary Refill: Capillary refill takes less than 2 seconds.     Coloration: Skin is not jaundiced or pale.     Findings: No bruising, erythema, lesion or rash.  Neurological:     General: No focal deficit present.     Mental Status: She is alert and oriented to person, place, and time. Mental status is at baseline.  Psychiatric:        Mood and Affect: Mood normal.        Behavior: Behavior normal.        Thought Content: Thought content normal.        Judgment: Judgment normal.     Results for orders placed or performed in visit on 48/18/56  Basic metabolic panel  Result Value Ref Range   Glucose 108 (H) 65 - 99 mg/dL   BUN 10 6 - 24 mg/dL   Creatinine, Ser 0.75 0.57 - 1.00 mg/dL   GFR calc non Af Amer 88 >59 mL/min/1.73   GFR calc Af Amer 101 >59 mL/min/1.73   BUN/Creatinine Ratio 13 9 - 23   Sodium 141 134 - 144 mmol/L   Potassium 3.5 3.5 - 5.2 mmol/L   Chloride 105 96  - 106 mmol/L   CO2 23 20 - 29 mmol/L   Calcium 9.2 8.7 - 10.2 mg/dL      Assessment & Plan:   Problem List Items Addressed This Visit      Cardiovascular and Mediastinum   Benign hypertension - Primary    Under good control on current regimen. Continue current regimen. Continue to monitor. Call with any concerns. Refills given. Labs drawn today. Recheck 6 months.        Relevant Medications   verapamil (VERELAN PM) 180 MG 24 hr capsule   atorvastatin (LIPITOR) 20 MG tablet   Other Relevant Orders   Comp Met (CMET)   Microalbumin, Urine Waived     Other   Hyperlipidemia    Under good control on current regimen. Continue current regimen. Continue to monitor. Call with any concerns. Refills given. Labs drawn today. Recheck 6 months.        Relevant Medications   verapamil (VERELAN PM) 180 MG 24 hr capsule   atorvastatin (LIPITOR) 20 MG tablet   Other Relevant Orders   Comp Met (CMET)   Lipid Panel w/o Chol/HDL Ratio OUT       Follow up plan: Return in about 6 months (around 11/25/2019) for Physical.

## 2019-05-29 LAB — COMPREHENSIVE METABOLIC PANEL
ALT: 43 IU/L — ABNORMAL HIGH (ref 0–32)
AST: 40 IU/L (ref 0–40)
Albumin/Globulin Ratio: 1.5 (ref 1.2–2.2)
Albumin: 4.3 g/dL (ref 3.8–4.9)
Alkaline Phosphatase: 72 IU/L (ref 39–117)
BUN/Creatinine Ratio: 10 (ref 9–23)
BUN: 8 mg/dL (ref 6–24)
Bilirubin Total: 0.6 mg/dL (ref 0.0–1.2)
CO2: 26 mmol/L (ref 20–29)
Calcium: 9.4 mg/dL (ref 8.7–10.2)
Chloride: 103 mmol/L (ref 96–106)
Creatinine, Ser: 0.82 mg/dL (ref 0.57–1.00)
GFR calc Af Amer: 91 mL/min/{1.73_m2} (ref >59)
GFR calc non Af Amer: 79 mL/min/{1.73_m2} (ref >59)
Globulin, Total: 2.8 g/dL (ref 1.5–4.5)
Glucose: 93 mg/dL (ref 65–99)
Potassium: 3.5 mmol/L (ref 3.5–5.2)
Sodium: 142 mmol/L (ref 134–144)
Total Protein: 7.1 g/dL (ref 6.0–8.5)

## 2019-05-29 LAB — LIPID PANEL W/O CHOL/HDL RATIO
Cholesterol, Total: 217 mg/dL — ABNORMAL HIGH (ref 100–199)
HDL: 47 mg/dL (ref >39)
LDL Chol Calc (NIH): 154 mg/dL — ABNORMAL HIGH (ref 0–99)
Triglycerides: 91 mg/dL (ref 0–149)
VLDL Cholesterol Cal: 16 mg/dL (ref 5–40)

## 2019-06-27 ENCOUNTER — Other Ambulatory Visit: Payer: Self-pay | Admitting: Nurse Practitioner

## 2019-09-26 ENCOUNTER — Ambulatory Visit (INDEPENDENT_AMBULATORY_CARE_PROVIDER_SITE_OTHER): Payer: Self-pay | Admitting: Unknown Physician Specialty

## 2019-09-26 ENCOUNTER — Encounter: Payer: Self-pay | Admitting: Unknown Physician Specialty

## 2019-09-26 ENCOUNTER — Other Ambulatory Visit: Payer: Self-pay

## 2019-09-26 VITALS — BP 142/87 | HR 75 | Temp 98.3°F

## 2019-09-26 DIAGNOSIS — R809 Proteinuria, unspecified: Secondary | ICD-10-CM

## 2019-09-26 DIAGNOSIS — E1129 Type 2 diabetes mellitus with other diabetic kidney complication: Secondary | ICD-10-CM | POA: Insufficient documentation

## 2019-09-26 DIAGNOSIS — I129 Hypertensive chronic kidney disease with stage 1 through stage 4 chronic kidney disease, or unspecified chronic kidney disease: Secondary | ICD-10-CM

## 2019-09-26 DIAGNOSIS — E7800 Pure hypercholesterolemia, unspecified: Secondary | ICD-10-CM

## 2019-09-26 DIAGNOSIS — I10 Essential (primary) hypertension: Secondary | ICD-10-CM

## 2019-09-26 LAB — MICROALBUMIN, URINE WAIVED
Creatinine, Urine Waived: 200 mg/dL (ref 10–300)
Microalb, Ur Waived: 80 mg/L — ABNORMAL HIGH (ref 0–19)

## 2019-09-26 MED ORDER — LOSARTAN POTASSIUM 50 MG PO TABS: 50.0000 mg | ORAL_TABLET | Freq: Every day | ORAL | 0 refills | Status: DC

## 2019-09-26 NOTE — Progress Notes (Signed)
BP (!) 142/87   Pulse 75   Temp 98.3 F (36.8 C)   LMP 10/14/1992 (Approximate)   SpO2 100%    Subjective:    Patient ID: Linda Boyle, female    DOB: 30-May-1961, 59 y.o.   MRN: [id number]  HPI: Linda Boyle is a 59 y.o. female  Chief Complaint  Patient presents with  . Hypertension  . Hyperlipidemia  . ifg   Hypertension Using medications without difficulty Average home BPs: Not checking    No problems or lightheadedness No chest pain with exertion or shortness of breath No Edema Note positive microalbumin previous visit.    Hyperlipidemia Taking Atorvastatin 20 mg Using medications without problems: No Muscle aches  Diet compliance: Eats healthy Exercise: Started back walking    Relevant past medical, surgical, family and social history reviewed and updated as indicated. Interim medical history since our last visit reviewed. Allergies and medications reviewed and updated.  Review of Systems  Constitutional: Negative.   Respiratory: Negative.   Cardiovascular: Negative.   Psychiatric/Behavioral: Negative.     Per HPI unless specifically indicated above     Objective:    BP (!) 142/87   Pulse 75   Temp 98.3 F (36.8 C)   LMP 10/14/1992 (Approximate)   SpO2 100%   Wt Readings from Last 3 Encounters:  05/28/19 161 lb (73 kg)  02/21/19 158 lb (71.7 kg)  02/06/19 159 lb (72.1 kg)    Physical Exam Constitutional:      General: She is not in acute distress.    Appearance: Normal appearance. She is well-developed.  HENT:     Head: Normocephalic and atraumatic.  Eyes:     General: Lids are normal. No scleral icterus.       Right eye: No discharge.        Left eye: No discharge.     Conjunctiva/sclera: Conjunctivae normal.  Neck:     Vascular: No carotid bruit or JVD.  Cardiovascular:     Rate and Rhythm: Normal rate and regular rhythm.     Heart sounds: Normal heart sounds.  Pulmonary:     Effort: Pulmonary effort is normal.   Breath sounds: Normal breath sounds.  Abdominal:     Palpations: There is no hepatomegaly or splenomegaly.  Musculoskeletal:        General: Normal range of motion.     Cervical back: Normal range of motion and neck supple.  Skin:    General: Skin is warm and dry.     Coloration: Skin is not pale.     Findings: No rash.  Neurological:     Mental Status: She is alert and oriented to person, place, and time.  Psychiatric:        Behavior: Behavior normal.        Thought Content: Thought content normal.        Judgment: Judgment normal.     Results for orders placed or performed in visit on 05/28/19  Comp Met (CMET)  Result Value Ref Range   Glucose 93 65 - 99 mg/dL   BUN 8 6 - 24 mg/dL   Creatinine, Ser 0.82 0.57 - 1.00 mg/dL   GFR calc non Af Amer 79 >59 mL/min/1.73   GFR calc Af Amer 91 >59 mL/min/1.73   BUN/Creatinine Ratio 10 9 - 23   Sodium 142 134 - 144 mmol/L   Potassium 3.5 3.5 - 5.2 mmol/L   Chloride 103 96 - 106 mmol/L  CO2 26 20 - 29 mmol/L   Calcium 9.4 8.7 - 10.2 mg/dL   Total Protein 7.1 6.0 - 8.5 g/dL   Albumin 4.3 3.8 - 4.9 g/dL   Globulin, Total 2.8 1.5 - 4.5 g/dL   Albumin/Globulin Ratio 1.5 1.2 - 2.2   Bilirubin Total 0.6 0.0 - 1.2 mg/dL   Alkaline Phosphatase 72 39 - 117 IU/L   AST 40 0 - 40 IU/L   ALT 43 (H) 0 - 32 IU/L  Lipid Panel w/o Chol/HDL Ratio OUT  Result Value Ref Range   Cholesterol, Total 217 (H) 100 - 199 mg/dL   Triglycerides 91 0 - 149 mg/dL   HDL 47 >39 mg/dL   VLDL Cholesterol Cal 16 5 - 40 mg/dL   LDL Chol Calc (NIH) 154 (H) 0 - 99 mg/dL  Microalbumin, Urine Waived  Result Value Ref Range   Microalb, Ur Waived 80 (H) 0 - 19 mg/L   Creatinine, Urine Waived 200 10 - 300 mg/dL   Microalb/Creat Ratio 30-300 (H) <30 mg/g      Assessment & Plan:   Problem List Items Addressed This Visit      Unprioritized   Benign hypertensive renal disease - Primary    Start Losartan for BP not to goal and micoalbuminuia.  Cough with  Lisinopril      Relevant Orders   Microalbumin, Urine Waived   Comprehensive metabolic panel   Hyperlipidemia    Check lipid panel.  Consider increasing Atorvastatin      Relevant Medications   losartan (COZAAR) 50 MG tablet   Other Relevant Orders   Lipid Panel w/o Chol/HDL Ratio   Microalbuminuria    Today it is 80.  This is the second reading of above 30.  Will start Losartan due to this and BP not to goal.         Other Visit Diagnoses    Essential hypertension       Relevant Medications   losartan (COZAAR) 50 MG tablet   Other Relevant Orders   Microalbumin, Urine Waived       Follow up plan: Return in about 4 weeks (around 10/24/2019). for BMP and f/u BP

## 2019-09-26 NOTE — Assessment & Plan Note (Signed)
Today it is 80.  This is the second reading of above 30.  Will start Losartan due to this and BP not to goal.

## 2019-09-26 NOTE — Assessment & Plan Note (Signed)
Check lipid panel.  Consider increasing Atorvastatin

## 2019-09-26 NOTE — Assessment & Plan Note (Addendum)
Start Losartan for BP not to goal and micoalbuminuia.  Cough with Lisinopril

## 2019-09-27 LAB — COMPREHENSIVE METABOLIC PANEL
ALT: 56 IU/L — ABNORMAL HIGH (ref 0–32)
AST: 43 IU/L — ABNORMAL HIGH (ref 0–40)
Albumin/Globulin Ratio: 1.5 (ref 1.2–2.2)
Albumin: 4.2 g/dL (ref 3.8–4.9)
Alkaline Phosphatase: 76 IU/L (ref 39–117)
BUN/Creatinine Ratio: 12 (ref 9–23)
BUN: 10 mg/dL (ref 6–24)
Bilirubin Total: 0.5 mg/dL (ref 0.0–1.2)
CO2: 23 mmol/L (ref 20–29)
Calcium: 9.5 mg/dL (ref 8.7–10.2)
Chloride: 104 mmol/L (ref 96–106)
Creatinine, Ser: 0.85 mg/dL (ref 0.57–1.00)
GFR calc Af Amer: 87 mL/min/{1.73_m2} (ref >59)
GFR calc non Af Amer: 75 mL/min/{1.73_m2} (ref >59)
Globulin, Total: 2.8 g/dL (ref 1.5–4.5)
Glucose: 101 mg/dL — ABNORMAL HIGH (ref 65–99)
Potassium: 3.5 mmol/L (ref 3.5–5.2)
Sodium: 144 mmol/L (ref 134–144)
Total Protein: 7 g/dL (ref 6.0–8.5)

## 2019-09-27 LAB — LIPID PANEL W/O CHOL/HDL RATIO
Cholesterol, Total: 194 mg/dL (ref 100–199)
HDL: 44 mg/dL (ref >39)
LDL Chol Calc (NIH): 126 mg/dL — ABNORMAL HIGH (ref 0–99)
Triglycerides: 136 mg/dL (ref 0–149)
VLDL Cholesterol Cal: 24 mg/dL (ref 5–40)

## 2019-09-29 ENCOUNTER — Encounter: Payer: Self-pay | Admitting: Nurse Practitioner

## 2019-09-29 DIAGNOSIS — R7989 Other specified abnormal findings of blood chemistry: Secondary | ICD-10-CM | POA: Insufficient documentation

## 2019-10-24 ENCOUNTER — Encounter: Payer: Self-pay | Admitting: Nurse Practitioner

## 2019-10-24 ENCOUNTER — Ambulatory Visit (INDEPENDENT_AMBULATORY_CARE_PROVIDER_SITE_OTHER): Payer: Self-pay | Admitting: Nurse Practitioner

## 2019-10-24 ENCOUNTER — Other Ambulatory Visit: Payer: Self-pay

## 2019-10-24 VITALS — BP 140/84 | HR 80 | Temp 98.3°F | Wt 159.0 lb

## 2019-10-24 DIAGNOSIS — I129 Hypertensive chronic kidney disease with stage 1 through stage 4 chronic kidney disease, or unspecified chronic kidney disease: Secondary | ICD-10-CM

## 2019-10-24 DIAGNOSIS — E669 Obesity, unspecified: Secondary | ICD-10-CM

## 2019-10-24 DIAGNOSIS — R7989 Other specified abnormal findings of blood chemistry: Secondary | ICD-10-CM

## 2019-10-24 MED ORDER — HYDROCHLOROTHIAZIDE 25 MG PO TABS: 25.0000 mg | ORAL_TABLET | Freq: Every day | ORAL | 3 refills | Status: DC

## 2019-10-24 NOTE — Patient Instructions (Signed)
DASH Eating Plan DASH stands for "Dietary Approaches to Stop Hypertension." The DASH eating plan is a healthy eating plan that has been shown to reduce high blood pressure (hypertension). It may also reduce your risk for type 2 diabetes, heart disease, and stroke. The DASH eating plan may also help with weight loss. What are tips for following this plan?  General guidelines  Avoid eating more than 2,300 mg (milligrams) of salt (sodium) a day. If you have hypertension, you may need to reduce your sodium intake to 1,500 mg a day.  Limit alcohol intake to no more than 1 drink a day for nonpregnant women and 2 drinks a day for men. One drink equals 12 oz of beer, 5 oz of wine, or 1 oz of hard liquor.  Work with your health care provider to maintain a healthy body weight or to lose weight. Ask what an ideal weight is for you.  Get at least 30 minutes of exercise that causes your heart to beat faster (aerobic exercise) most days of the week. Activities may include walking, swimming, or biking.  Work with your health care provider or diet and nutrition specialist (dietitian) to adjust your eating plan to your individual calorie needs. Reading food labels   Check food labels for the amount of sodium per serving. Choose foods with less than 5 percent of the Daily Value of sodium. Generally, foods with less than 300 mg of sodium per serving fit into this eating plan.  To find whole grains, look for the word "whole" as the first word in the ingredient list. Shopping  Buy products labeled as "low-sodium" or "no salt added."  Buy fresh foods. Avoid canned foods and premade or frozen meals. Cooking  Avoid adding salt when cooking. Use salt-free seasonings or herbs instead of table salt or sea salt. Check with your health care provider or pharmacist before using salt substitutes.  Do not fry foods. Cook foods using healthy methods such as baking, boiling, grilling, and broiling instead.  Cook with  heart-healthy oils, such as olive, canola, soybean, or sunflower oil. Meal planning  Eat a balanced diet that includes: ? 5 or more servings of fruits and vegetables each day. At each meal, try to fill half of your plate with fruits and vegetables. ? Up to 6-8 servings of whole grains each day. ? Less than 6 oz of lean meat, poultry, or fish each day. A 3-oz serving of meat is about the same size as a deck of cards. One egg equals 1 oz. ? 2 servings of low-fat dairy each day. ? A serving of nuts, seeds, or beans 5 times each week. ? Heart-healthy fats. Healthy fats called Omega-3 fatty acids are found in foods such as flaxseeds and coldwater fish, like sardines, salmon, and mackerel.  Limit how much you eat of the following: ? Canned or prepackaged foods. ? Food that is high in trans fat, such as fried foods. ? Food that is high in saturated fat, such as fatty meat. ? Sweets, desserts, sugary drinks, and other foods with added sugar. ? Full-fat dairy products.  Do not salt foods before eating.  Try to eat at least 2 vegetarian meals each week.  Eat more home-cooked food and less restaurant, buffet, and fast food.  When eating at a restaurant, ask that your food be prepared with less salt or no salt, if possible. What foods are recommended? The items listed may not be a complete list. Talk with your dietitian about   what dietary choices are best for you. Grains Whole-grain or whole-wheat bread. Whole-grain or whole-wheat pasta. Brown rice. Oatmeal. Quinoa. Bulgur. Whole-grain and low-sodium cereals. Pita bread. Low-fat, low-sodium crackers. Whole-wheat flour tortillas. Vegetables Fresh or frozen vegetables (raw, steamed, roasted, or grilled). Low-sodium or reduced-sodium tomato and vegetable juice. Low-sodium or reduced-sodium tomato sauce and tomato paste. Low-sodium or reduced-sodium canned vegetables. Fruits All fresh, dried, or frozen fruit. Canned fruit in natural juice (without  added sugar). Meat and other protein foods Skinless chicken or turkey. Ground chicken or turkey. Pork with fat trimmed off. Fish and seafood. Egg whites. Dried beans, peas, or lentils. Unsalted nuts, nut butters, and seeds. Unsalted canned beans. Lean cuts of beef with fat trimmed off. Low-sodium, lean deli meat. Dairy Low-fat (1%) or fat-free (skim) milk. Fat-free, low-fat, or reduced-fat cheeses. Nonfat, low-sodium ricotta or cottage cheese. Low-fat or nonfat yogurt. Low-fat, low-sodium cheese. Fats and oils Soft margarine without trans fats. Vegetable oil. Low-fat, reduced-fat, or light mayonnaise and salad dressings (reduced-sodium). Canola, safflower, olive, soybean, and sunflower oils. Avocado. Seasoning and other foods Herbs. Spices. Seasoning mixes without salt. Unsalted popcorn and pretzels. Fat-free sweets. What foods are not recommended? The items listed may not be a complete list. Talk with your dietitian about what dietary choices are best for you. Grains Baked goods made with fat, such as croissants, muffins, or some breads. Dry pasta or rice meal packs. Vegetables Creamed or fried vegetables. Vegetables in a cheese sauce. Regular canned vegetables (not low-sodium or reduced-sodium). Regular canned tomato sauce and paste (not low-sodium or reduced-sodium). Regular tomato and vegetable juice (not low-sodium or reduced-sodium). Pickles. Olives. Fruits Canned fruit in a light or heavy syrup. Fried fruit. Fruit in cream or butter sauce. Meat and other protein foods Fatty cuts of meat. Ribs. Fried meat. Bacon. Sausage. Bologna and other processed lunch meats. Salami. Fatback. Hotdogs. Bratwurst. Salted nuts and seeds. Canned beans with added salt. Canned or smoked fish. Whole eggs or egg yolks. Chicken or turkey with skin. Dairy Whole or 2% milk, cream, and half-and-half. Whole or full-fat cream cheese. Whole-fat or sweetened yogurt. Full-fat cheese. Nondairy creamers. Whipped toppings.  Processed cheese and cheese spreads. Fats and oils Butter. Stick margarine. Lard. Shortening. Ghee. Bacon fat. Tropical oils, such as coconut, palm kernel, or palm oil. Seasoning and other foods Salted popcorn and pretzels. Onion salt, garlic salt, seasoned salt, table salt, and sea salt. Worcestershire sauce. Tartar sauce. Barbecue sauce. Teriyaki sauce. Soy sauce, including reduced-sodium. Steak sauce. Canned and packaged gravies. Fish sauce. Oyster sauce. Cocktail sauce. Horseradish that you find on the shelf. Ketchup. Mustard. Meat flavorings and tenderizers. Bouillon cubes. Hot sauce and Tabasco sauce. Premade or packaged marinades. Premade or packaged taco seasonings. Relishes. Regular salad dressings. Where to find more information:  National Heart, Lung, and Blood Institute: www.nhlbi.nih.gov  American Heart Association: www.heart.org Summary  The DASH eating plan is a healthy eating plan that has been shown to reduce high blood pressure (hypertension). It may also reduce your risk for type 2 diabetes, heart disease, and stroke.  With the DASH eating plan, you should limit salt (sodium) intake to 2,300 mg a day. If you have hypertension, you may need to reduce your sodium intake to 1,500 mg a day.  When on the DASH eating plan, aim to eat more fresh fruits and vegetables, whole grains, lean proteins, low-fat dairy, and heart-healthy fats.  Work with your health care provider or diet and nutrition specialist (dietitian) to adjust your eating plan to your   individual calorie needs. This information is not intended to replace advice given to you by your health care provider. Make sure you discuss any questions you have with your health care provider. Document Revised: 06/08/2017 Document Reviewed: 06/19/2016 Elsevier Patient Education  2020 [employer]  

## 2019-10-24 NOTE — Progress Notes (Signed)
BP 140/84 (BP Location: Left Arm)   Pulse 80   Temp 98.3 F (36.8 C) (Oral)   Wt 159 lb (72.1 kg)   LMP 10/14/1992 (Approximate)   SpO2 97%   BMI 30.04 kg/m    Subjective:    Patient ID: Linda Boyle, female    DOB: 1960/08/02, 59 y.o.   MRN: [id number]  HPI: Linda Boyle is a 59 y.o. female  Chief Complaint  Patient presents with  . Follow-up    BP and BMP   HYPERTENSION Last visit was started on Losartan due to proteinuria being noted > 30 and elevated BP.  She reports the Losartan started to make her cough, she stopped taking it and then the cough went away.    In March labs did note mild elevation LFT ALT/AST 43/56.   Hypertension status: stable  Satisfied with current treatment? yes Duration of hypertension: chronic BP monitoring frequency:  rarely BP range: 140/80's BP medication side effects:  no Medication compliance: good compliance Previous BP meds: Lisinopril -- cough, Hydrochlorothiazide, Losartan Aspirin: no Recurrent headaches: no Visual changes: no Palpitations: no Dyspnea: no Chest pain: no Lower extremity edema: no Dizzy/lightheaded: no  Relevant past medical, surgical, family and social history reviewed and updated as indicated. Interim medical history since our last visit reviewed. Allergies and medications reviewed and updated.  Review of Systems  Constitutional: Negative for activity change, appetite change, diaphoresis, fatigue and fever.  Respiratory: Negative for cough, chest tightness and shortness of breath.   Cardiovascular: Negative for chest pain, palpitations and leg swelling.  Gastrointestinal: Negative.   Neurological: Negative.   Psychiatric/Behavioral: Negative.     Per HPI unless specifically indicated above     Objective:    BP 140/84 (BP Location: Left Arm)   Pulse 80   Temp 98.3 F (36.8 C) (Oral)   Wt 159 lb (72.1 kg)   LMP 10/14/1992 (Approximate)   SpO2 97%   BMI 30.04 kg/m   Wt Readings from  Last 3 Encounters:  10/24/19 159 lb (72.1 kg)  05/28/19 161 lb (73 kg)  02/21/19 158 lb (71.7 kg)    Physical Exam Vitals and nursing note reviewed.  Constitutional:      General: She is awake. She is not in acute distress.    Appearance: She is well-developed and well-groomed. She is obese. She is not ill-appearing.  HENT:     Head: Normocephalic.     Right Ear: Hearing normal.     Left Ear: Hearing normal.  Eyes:     General: Lids are normal.        Right eye: No discharge.        Left eye: No discharge.     Conjunctiva/sclera: Conjunctivae normal.     Pupils: Pupils are equal, round, and reactive to light.  Neck:     Vascular: No carotid bruit.  Cardiovascular:     Rate and Rhythm: Normal rate and regular rhythm.     Heart sounds: Normal heart sounds. No murmur. No gallop.   Pulmonary:     Effort: Pulmonary effort is normal. No accessory muscle usage or respiratory distress.     Breath sounds: Normal breath sounds.  Abdominal:     General: Bowel sounds are normal.     Palpations: Abdomen is soft. There is no hepatomegaly or splenomegaly.  Musculoskeletal:     Cervical back: Normal range of motion and neck supple.     Right lower leg: No edema.  Left lower leg: No edema.  Skin:    General: Skin is warm and dry.  Neurological:     Mental Status: She is alert and oriented to person, place, and time.  Psychiatric:        Attention and Perception: Attention normal.        Mood and Affect: Mood normal.        Behavior: Behavior normal. Behavior is cooperative.        Thought Content: Thought content normal.        Judgment: Judgment normal.     Results for orders placed or performed in visit on 09/26/19  Microalbumin, Urine Waived  Result Value Ref Range   Microalb, Ur Waived 80 (H) 0 - 19 mg/L   Creatinine, Urine Waived 200 10 - 300 mg/dL   Microalb/Creat Ratio 30-30 (H) <30 mg/g  Lipid Panel w/o Chol/HDL Ratio  Result Value Ref Range   Cholesterol, Total 194  100 - 199 mg/dL   Triglycerides 136 0 - 149 mg/dL   HDL 44 >39 mg/dL   VLDL Cholesterol Cal 24 5 - 40 mg/dL   LDL Chol Calc (NIH) 126 (H) 0 - 99 mg/dL  Comprehensive metabolic panel  Result Value Ref Range   Glucose 101 (H) 65 - 99 mg/dL   BUN 10 6 - 24 mg/dL   Creatinine, Ser 0.85 0.57 - 1.00 mg/dL   GFR calc non Af Amer 75 >59 mL/min/1.73   GFR calc Af Amer 87 >59 mL/min/1.73   BUN/Creatinine Ratio 12 9 - 23   Sodium 144 134 - 144 mmol/L   Potassium 3.5 3.5 - 5.2 mmol/L   Chloride 104 96 - 106 mmol/L   CO2 23 20 - 29 mmol/L   Calcium 9.5 8.7 - 10.2 mg/dL   Total Protein 7.0 6.0 - 8.5 g/dL   Albumin 4.2 3.8 - 4.9 g/dL   Globulin, Total 2.8 1.5 - 4.5 g/dL   Albumin/Globulin Ratio 1.5 1.2 - 2.2   Bilirubin Total 0.5 0.0 - 1.2 mg/dL   Alkaline Phosphatase 76 39 - 117 IU/L   AST 43 (H) 0 - 40 IU/L   ALT 56 (H) 0 - 32 IU/L      Assessment & Plan:   Problem List Items Addressed This Visit      Genitourinary   Benign hypertensive renal disease - Primary    Chronic, ongoing.  Stop Losartan -- does no tolerate ACe or ARB.  Recommend she continue to check BP at least 3 days at week at home and document.  Continue current medication regimen with Verapamil, add on HCTZ which has offered benefit in past. Obtain BMP today.  Return in 4 weeks.      Relevant Orders   Basic metabolic panel     Other   Obesity (BMI 30-39.9)    Recommended eating smaller high protein, low fat meals more frequently and exercising 30 mins a day 5 times a week with a goal of 10-15lb weight loss in the next 3 months. Patient voiced their understanding and motivation to adhere to these recommendations.       Elevated LFTs    Recheck in September, recommend heavy focus on weight loss and diet changes.          Follow up plan: Return in about 4 weeks (around 11/21/2019) for HTN and BMP.

## 2019-10-24 NOTE — Assessment & Plan Note (Signed)
Recheck in September, recommend heavy focus on weight loss and diet changes.

## 2019-10-24 NOTE — Assessment & Plan Note (Signed)
Recommended eating smaller high protein, low fat meals more frequently and exercising 30 mins a day 5 times a week with a goal of 10-15lb weight loss in the next 3 months. Patient voiced their understanding and motivation to adhere to these recommendations.  

## 2019-10-24 NOTE — Assessment & Plan Note (Signed)
Chronic, ongoing.  Stop Losartan -- does no tolerate ACe or ARB.  Recommend she continue to check BP at least 3 days at week at home and document.  Continue current medication regimen with Verapamil, add on HCTZ which has offered benefit in past. Obtain BMP today.  Return in 4 weeks.

## 2019-10-25 LAB — BASIC METABOLIC PANEL
BUN/Creatinine Ratio: 11 (ref 9–23)
BUN: 10 mg/dL (ref 6–24)
CO2: 27 mmol/L (ref 20–29)
Calcium: 9.3 mg/dL (ref 8.7–10.2)
Chloride: 103 mmol/L (ref 96–106)
Creatinine, Ser: 0.88 mg/dL (ref 0.57–1.00)
GFR calc Af Amer: 83 mL/min/{1.73_m2} (ref >59)
GFR calc non Af Amer: 72 mL/min/{1.73_m2} (ref >59)
Glucose: 106 mg/dL — ABNORMAL HIGH (ref 65–99)
Potassium: 3.6 mmol/L (ref 3.5–5.2)
Sodium: 141 mmol/L (ref 134–144)

## 2019-10-26 NOTE — Progress Notes (Signed)
Contacted via MyChart

## 2019-10-31 ENCOUNTER — Encounter: Payer: Self-pay | Admitting: Nurse Practitioner

## 2019-10-31 ENCOUNTER — Other Ambulatory Visit: Payer: Self-pay

## 2019-10-31 ENCOUNTER — Ambulatory Visit (INDEPENDENT_AMBULATORY_CARE_PROVIDER_SITE_OTHER): Payer: Self-pay | Admitting: Nurse Practitioner

## 2019-10-31 DIAGNOSIS — R059 Cough, unspecified: Secondary | ICD-10-CM

## 2019-10-31 DIAGNOSIS — R052 Subacute cough: Secondary | ICD-10-CM | POA: Insufficient documentation

## 2019-10-31 DIAGNOSIS — R05 Cough: Secondary | ICD-10-CM

## 2019-10-31 MED ORDER — PREDNISONE 20 MG PO TABS: 40.0000 mg | ORAL_TABLET | Freq: Every day | ORAL | 0 refills | Status: AC

## 2019-10-31 MED ORDER — AZITHROMYCIN 250 MG PO TABS: ORAL_TABLET | ORAL | 0 refills | Status: DC

## 2019-10-31 MED ORDER — VERAPAMIL HCL ER 180 MG PO CP24: 180.0000 mg | ORAL_CAPSULE | Freq: Every day | ORAL | 4 refills | Status: DC

## 2019-10-31 NOTE — Patient Instructions (Signed)

## 2019-10-31 NOTE — Progress Notes (Signed)
LMP 10/14/1992 (Approximate)    Subjective:    Patient ID: Linda Boyle, female    DOB: 02/25/1961, 59 y.o.   MRN: [id number]  HPI: Linda NEUMAIER is a 59 y.o. female  Chief Complaint  Patient presents with  . URI    pt states she has been having congestion, cough, and sinus pressure for the past week     . This visit was completed via telephone due to the restrictions of the COVID-19 pandemic. All issues as above were discussed and addressed but no physical exam was performed. If it was felt that the patient should be evaluated in the office, they were directed there. The patient verbally consented to this visit. Patient was unable to complete an audio/visual visit due to Lack of equipment. Due to the catastrophic nature of the COVID-19 pandemic, this visit was done through audio contact only. . Location of the patient: home . Location of the provider: work . Those involved with this call:  . Provider: Marnee Guarneri, DNP . CMA: Yvonna Alanis, CMA . Front Desk/Registration: Don Perking  . Time spent on call: 15 minutes on the phone discussing health concerns. 10 minutes total spent in review of patient's record and preparation of their chart.  . I verified patient identity using two factors (patient name and date of birth). Patient consents verbally to being seen via telemedicine visit today.    UPPER RESPIRATORY TRACT INFECTION Ongoing congestion, cough, and sinus pressure present x one week.  Started on Saturday and went to [facility], her Covid test was negative.  They did not prescribe anything, told her to take Mucinex and OTC medications.  She reports he glands are swollen on her throat and throat was a little sore + noticed some wheezing on Saturday over night.  Throat is now improving and cough is improving some, but still has congestion and sinus pressure.  Does feel like she is slowly improving, but not 100%. Fever: no Cough: yes Shortness of breath:  no Wheezing: improving Chest pain: no Chest tightness: no Chest congestion: no Nasal congestion: no Runny nose: yes Post nasal drip: yes Sneezing: no Sore throat: no Swollen glands: yes Sinus pressure: yes Headache: no Face pain: no Toothache: no Ear pain: none Ear pressure:  none Eyes red/itching:no Eye drainage/crusting: no  Vomiting: no Rash: no Fatigue: yes Sick contacts: no Strep contacts: no  Context: stable Recurrent sinusitis: no Relief with OTC cold/cough medications: yes  Treatments attempted: cold/sinus and mucinex   Relevant past medical, surgical, family and social history reviewed and updated as indicated. Interim medical history since our last visit reviewed. Allergies and medications reviewed and updated.  Review of Systems  Constitutional: Positive for fatigue. Negative for activity change, appetite change and fever.  HENT: Positive for postnasal drip and rhinorrhea. Negative for congestion, ear discharge, ear pain, facial swelling, sinus pressure, sinus pain, sneezing, sore throat and voice change.   Eyes: Negative for pain and visual disturbance.  Respiratory: Positive for cough. Negative for chest tightness, shortness of breath and wheezing.   Cardiovascular: Negative for chest pain, palpitations and leg swelling.  Gastrointestinal: Negative.   Musculoskeletal: Negative for myalgias.  Neurological: Negative for dizziness, numbness and headaches.  Psychiatric/Behavioral: Negative.     Per HPI unless specifically indicated above     Objective:    LMP 10/14/1992 (Approximate)   Wt Readings from Last 3 Encounters:  10/24/19 159 lb (72.1 kg)  05/28/19 161 lb (73 kg)  02/21/19 158 lb (71.7  kg)    Physical Exam   Unable to perform due to telephone visit only.  Results for orders placed or performed in visit on 123456  Basic metabolic panel  Result Value Ref Range   Glucose 106 (H) 65 - 99 mg/dL   BUN 10 6 - 24 mg/dL   Creatinine, Ser 0.88  0.57 - 1.00 mg/dL   GFR calc non Af Amer 72 >59 mL/min/1.73   GFR calc Af Amer 83 >59 mL/min/1.73   BUN/Creatinine Ratio 11 9 - 23   Sodium 141 134 - 144 mmol/L   Potassium 3.6 3.5 - 5.2 mmol/L   Chloride 103 96 - 106 mmol/L   CO2 27 20 - 29 mmol/L   Calcium 9.3 8.7 - 10.2 mg/dL      Assessment & Plan:   Problem List Items Addressed This Visit      Other   Cough - Primary    Acute, ongoing for one week with negative Covid testing at UC recently.  Due to length of time will send in Prednisone and ZPack.  Continue Flonase, Allegra, and at home OTC medications (Mucinex) for cough.  Ensure plenty of rest and hydration.  If worsening or ongoing symptoms she is aware to immediately return to office for follow-up.           I discussed the assessment and treatment plan with the patient. The patient was provided an opportunity to ask questions and all were answered. The patient agreed with the plan and demonstrated an understanding of the instructions.   The patient was advised to call back or seek an in-person evaluation if the symptoms worsen or if the condition fails to improve as anticipated.   I provided 15+ minutes of time during this encounter.  Follow up plan: Return for as scheduled in May.

## 2019-10-31 NOTE — Assessment & Plan Note (Signed)
Acute, ongoing for one week with negative Covid testing at [facility] recently.  Due to length of time will send in Prednisone and ZPack.  Continue Flonase, Allegra, and at home OTC medications (Mucinex) for cough.  Ensure plenty of rest and hydration.  If worsening or ongoing symptoms she is aware to immediately return to office for follow-up.

## 2019-11-21 ENCOUNTER — Other Ambulatory Visit: Payer: Self-pay

## 2019-11-21 ENCOUNTER — Encounter: Payer: Self-pay | Admitting: Nurse Practitioner

## 2019-11-21 ENCOUNTER — Ambulatory Visit (INDEPENDENT_AMBULATORY_CARE_PROVIDER_SITE_OTHER): Payer: 59 | Admitting: Nurse Practitioner

## 2019-11-21 VITALS — BP 110/74 | HR 76 | Temp 98.2°F | Wt 154.6 lb

## 2019-11-21 DIAGNOSIS — I129 Hypertensive chronic kidney disease with stage 1 through stage 4 chronic kidney disease, or unspecified chronic kidney disease: Secondary | ICD-10-CM

## 2019-11-21 DIAGNOSIS — E669 Obesity, unspecified: Secondary | ICD-10-CM | POA: Diagnosis not present

## 2019-11-21 NOTE — Assessment & Plan Note (Signed)
Recommended eating smaller high protein, low fat meals more frequently and exercising 30 mins a day 5 times a week with a goal of 10-15lb weight loss in the next 3 months. Patient voiced their understanding and motivation to adhere to these recommendations.  

## 2019-11-21 NOTE — Assessment & Plan Note (Signed)
Chronic, ongoing with BP below goal today.  Does no tolerate ACE or ARB.  Recommend she continue to check BP at least 3 days at week at home and document.  Continue current medication regimen with Verapamil and HCTZ.  BMP today, if K+ low may need to consider discontinue HCTZ and increase Verapamil.  Return as scheduled for physical.

## 2019-11-21 NOTE — Progress Notes (Signed)
BP 110/74   Pulse 76   Temp 98.2 F (36.8 C) (Oral)   Wt 154 lb 9.6 oz (70.1 kg)   LMP 10/14/1992 (Approximate)   SpO2 98%   BMI 29.21 kg/m    Subjective:    Patient ID: Linda Boyle, female    DOB: 1961-05-24, 59 y.o.   MRN: [id number]  HPI: Linda Boyle is a 59 y.o. female  Chief Complaint  Patient presents with  . Hypertension    4 week f/up   HYPERTENSION Cough has presented with Lisinopril and Losartan.  Currently taking HCTZ 25 MG, along with her Verapamil.  Started this last visit and will check K+ today. Recent 3.6. Hypertension status: controlled  Satisfied with current treatment? yes Duration of hypertension: chronic BP monitoring frequency:  daily BP range: <130/70 range at home BP medication side effects:  no Medication compliance: good compliance Aspirin: no Recurrent headaches: no Visual changes: no Palpitations: no Dyspnea: no Chest pain: no Lower extremity edema: no Dizzy/lightheaded: no  Relevant past medical, surgical, family and social history reviewed and updated as indicated. Interim medical history since our last visit reviewed. Allergies and medications reviewed and updated.  Review of Systems  Constitutional: Negative for activity change, appetite change, diaphoresis, fatigue and fever.  Respiratory: Negative for cough, chest tightness and shortness of breath.   Cardiovascular: Negative for chest pain, palpitations and leg swelling.  Gastrointestinal: Negative.   Neurological: Negative.   Psychiatric/Behavioral: Negative.     Per HPI unless specifically indicated above     Objective:    BP 110/74   Pulse 76   Temp 98.2 F (36.8 C) (Oral)   Wt 154 lb 9.6 oz (70.1 kg)   LMP 10/14/1992 (Approximate)   SpO2 98%   BMI 29.21 kg/m   Wt Readings from Last 3 Encounters:  11/21/19 154 lb 9.6 oz (70.1 kg)  10/24/19 159 lb (72.1 kg)  05/28/19 161 lb (73 kg)    Physical Exam Vitals and nursing note reviewed.   Constitutional:      General: She is awake. She is not in acute distress.    Appearance: She is well-developed and well-groomed. She is obese. She is not ill-appearing.  HENT:     Head: Normocephalic.     Right Ear: Hearing normal.     Left Ear: Hearing normal.  Eyes:     General: Lids are normal.        Right eye: No discharge.        Left eye: No discharge.     Conjunctiva/sclera: Conjunctivae normal.     Pupils: Pupils are equal, round, and reactive to light.  Neck:     Vascular: No carotid bruit.  Cardiovascular:     Rate and Rhythm: Normal rate and regular rhythm.     Heart sounds: Normal heart sounds. No murmur. No gallop.   Pulmonary:     Effort: Pulmonary effort is normal. No accessory muscle usage or respiratory distress.     Breath sounds: Normal breath sounds.  Abdominal:     General: Bowel sounds are normal.     Palpations: Abdomen is soft. There is no hepatomegaly or splenomegaly.  Musculoskeletal:     Cervical back: Normal range of motion and neck supple.     Right lower leg: No edema.     Left lower leg: No edema.  Skin:    General: Skin is warm and dry.  Neurological:     Mental Status: She is alert  and oriented to person, place, and time.  Psychiatric:        Attention and Perception: Attention normal.        Mood and Affect: Mood normal.        Behavior: Behavior normal. Behavior is cooperative.        Thought Content: Thought content normal.        Judgment: Judgment normal.    Results for orders placed or performed in visit on 123456  Basic metabolic panel  Result Value Ref Range   Glucose 106 (H) 65 - 99 mg/dL   BUN 10 6 - 24 mg/dL   Creatinine, Ser 0.88 0.57 - 1.00 mg/dL   GFR calc non Af Amer 72 >59 mL/min/1.73   GFR calc Af Amer 83 >59 mL/min/1.73   BUN/Creatinine Ratio 11 9 - 23   Sodium 141 134 - 144 mmol/L   Potassium 3.6 3.5 - 5.2 mmol/L   Chloride 103 96 - 106 mmol/L   CO2 27 20 - 29 mmol/L   Calcium 9.3 8.7 - 10.2 mg/dL       Assessment & Plan:   Problem List Items Addressed This Visit      Genitourinary   Benign hypertensive renal disease - Primary    Chronic, ongoing with BP below goal today.  Does no tolerate ACE or ARB.  Recommend she continue to check BP at least 3 days at week at home and document.  Continue current medication regimen with Verapamil and HCTZ.  BMP today, if K+ low may need to consider discontinue HCTZ and increase Verapamil.  Return as scheduled for physical.      Relevant Orders   Basic metabolic panel     Other   Obesity (BMI 30-39.9)    Recommended eating smaller high protein, low fat meals more frequently and exercising 30 mins a day 5 times a week with a goal of 10-15lb weight loss in the next 3 months. Patient voiced their understanding and motivation to adhere to these recommendations.           Follow up plan: Return for s scheduled for physical on 18th.

## 2019-11-21 NOTE — Patient Instructions (Signed)
DASH Eating Plan DASH stands for "Dietary Approaches to Stop Hypertension." The DASH eating plan is a healthy eating plan that has been shown to reduce high blood pressure (hypertension). It may also reduce your risk for type 2 diabetes, heart disease, and stroke. The DASH eating plan may also help with weight loss. What are tips for following this plan?  General guidelines  Avoid eating more than 2,300 mg (milligrams) of salt (sodium) a day. If you have hypertension, you may need to reduce your sodium intake to 1,500 mg a day.  Limit alcohol intake to no more than 1 drink a day for nonpregnant women and 2 drinks a day for men. One drink equals 12 oz of beer, 5 oz of wine, or 1 oz of hard liquor.  Work with your health care provider to maintain a healthy body weight or to lose weight. Ask what an ideal weight is for you.  Get at least 30 minutes of exercise that causes your heart to beat faster (aerobic exercise) most days of the week. Activities may include walking, swimming, or biking.  Work with your health care provider or diet and nutrition specialist (dietitian) to adjust your eating plan to your individual calorie needs. Reading food labels   Check food labels for the amount of sodium per serving. Choose foods with less than 5 percent of the Daily Value of sodium. Generally, foods with less than 300 mg of sodium per serving fit into this eating plan.  To find whole grains, look for the word "whole" as the first word in the ingredient list. Shopping  Buy products labeled as "low-sodium" or "no salt added."  Buy fresh foods. Avoid canned foods and premade or frozen meals. Cooking  Avoid adding salt when cooking. Use salt-free seasonings or herbs instead of table salt or sea salt. Check with your health care provider or pharmacist before using salt substitutes.  Do not fry foods. Cook foods using healthy methods such as baking, boiling, grilling, and broiling instead.  Cook with  heart-healthy oils, such as olive, canola, soybean, or sunflower oil. Meal planning  Eat a balanced diet that includes: ? 5 or more servings of fruits and vegetables each day. At each meal, try to fill half of your plate with fruits and vegetables. ? Up to 6-8 servings of whole grains each day. ? Less than 6 oz of lean meat, poultry, or fish each day. A 3-oz serving of meat is about the same size as a deck of cards. One egg equals 1 oz. ? 2 servings of low-fat dairy each day. ? A serving of nuts, seeds, or beans 5 times each week. ? Heart-healthy fats. Healthy fats called Omega-3 fatty acids are found in foods such as flaxseeds and coldwater fish, like sardines, salmon, and mackerel.  Limit how much you eat of the following: ? Canned or prepackaged foods. ? Food that is high in trans fat, such as fried foods. ? Food that is high in saturated fat, such as fatty meat. ? Sweets, desserts, sugary drinks, and other foods with added sugar. ? Full-fat dairy products.  Do not salt foods before eating.  Try to eat at least 2 vegetarian meals each week.  Eat more home-cooked food and less restaurant, buffet, and fast food.  When eating at a restaurant, ask that your food be prepared with less salt or no salt, if possible. What foods are recommended? The items listed may not be a complete list. Talk with your dietitian about   what dietary choices are best for you. Grains Whole-grain or whole-wheat bread. Whole-grain or whole-wheat pasta. Brown rice. Oatmeal. Quinoa. Bulgur. Whole-grain and low-sodium cereals. Pita bread. Low-fat, low-sodium crackers. Whole-wheat flour tortillas. Vegetables Fresh or frozen vegetables (raw, steamed, roasted, or grilled). Low-sodium or reduced-sodium tomato and vegetable juice. Low-sodium or reduced-sodium tomato sauce and tomato paste. Low-sodium or reduced-sodium canned vegetables. Fruits All fresh, dried, or frozen fruit. Canned fruit in natural juice (without  added sugar). Meat and other protein foods Skinless chicken or turkey. Ground chicken or turkey. Pork with fat trimmed off. Fish and seafood. Egg whites. Dried beans, peas, or lentils. Unsalted nuts, nut butters, and seeds. Unsalted canned beans. Lean cuts of beef with fat trimmed off. Low-sodium, lean deli meat. Dairy Low-fat (1%) or fat-free (skim) milk. Fat-free, low-fat, or reduced-fat cheeses. Nonfat, low-sodium ricotta or cottage cheese. Low-fat or nonfat yogurt. Low-fat, low-sodium cheese. Fats and oils Soft margarine without trans fats. Vegetable oil. Low-fat, reduced-fat, or light mayonnaise and salad dressings (reduced-sodium). Canola, safflower, olive, soybean, and sunflower oils. Avocado. Seasoning and other foods Herbs. Spices. Seasoning mixes without salt. Unsalted popcorn and pretzels. Fat-free sweets. What foods are not recommended? The items listed may not be a complete list. Talk with your dietitian about what dietary choices are best for you. Grains Baked goods made with fat, such as croissants, muffins, or some breads. Dry pasta or rice meal packs. Vegetables Creamed or fried vegetables. Vegetables in a cheese sauce. Regular canned vegetables (not low-sodium or reduced-sodium). Regular canned tomato sauce and paste (not low-sodium or reduced-sodium). Regular tomato and vegetable juice (not low-sodium or reduced-sodium). Pickles. Olives. Fruits Canned fruit in a light or heavy syrup. Fried fruit. Fruit in cream or butter sauce. Meat and other protein foods Fatty cuts of meat. Ribs. Fried meat. Bacon. Sausage. Bologna and other processed lunch meats. Salami. Fatback. Hotdogs. Bratwurst. Salted nuts and seeds. Canned beans with added salt. Canned or smoked fish. Whole eggs or egg yolks. Chicken or turkey with skin. Dairy Whole or 2% milk, cream, and half-and-half. Whole or full-fat cream cheese. Whole-fat or sweetened yogurt. Full-fat cheese. Nondairy creamers. Whipped toppings.  Processed cheese and cheese spreads. Fats and oils Butter. Stick margarine. Lard. Shortening. Ghee. Bacon fat. Tropical oils, such as coconut, palm kernel, or palm oil. Seasoning and other foods Salted popcorn and pretzels. Onion salt, garlic salt, seasoned salt, table salt, and sea salt. Worcestershire sauce. Tartar sauce. Barbecue sauce. Teriyaki sauce. Soy sauce, including reduced-sodium. Steak sauce. Canned and packaged gravies. Fish sauce. Oyster sauce. Cocktail sauce. Horseradish that you find on the shelf. Ketchup. Mustard. Meat flavorings and tenderizers. Bouillon cubes. Hot sauce and Tabasco sauce. Premade or packaged marinades. Premade or packaged taco seasonings. Relishes. Regular salad dressings. Where to find more information:  National Heart, Lung, and Blood Institute: www.nhlbi.nih.gov  American Heart Association: www.heart.org Summary  The DASH eating plan is a healthy eating plan that has been shown to reduce high blood pressure (hypertension). It may also reduce your risk for type 2 diabetes, heart disease, and stroke.  With the DASH eating plan, you should limit salt (sodium) intake to 2,300 mg a day. If you have hypertension, you may need to reduce your sodium intake to 1,500 mg a day.  When on the DASH eating plan, aim to eat more fresh fruits and vegetables, whole grains, lean proteins, low-fat dairy, and heart-healthy fats.  Work with your health care provider or diet and nutrition specialist (dietitian) to adjust your eating plan to your   individual calorie needs. This information is not intended to replace advice given to you by your health care provider. Make sure you discuss any questions you have with your health care provider. Document Revised: 06/08/2017 Document Reviewed: 06/19/2016 Elsevier Patient Education  2020 [employer]  

## 2019-11-22 LAB — BASIC METABOLIC PANEL
BUN/Creatinine Ratio: 15 (ref 9–23)
BUN: 14 mg/dL (ref 6–24)
CO2: 25 mmol/L (ref 20–29)
Calcium: 9.5 mg/dL (ref 8.7–10.2)
Chloride: 101 mmol/L (ref 96–106)
Creatinine, Ser: 0.92 mg/dL (ref 0.57–1.00)
GFR calc Af Amer: 79 mL/min/{1.73_m2} (ref >59)
GFR calc non Af Amer: 68 mL/min/{1.73_m2} (ref >59)
Glucose: 109 mg/dL — ABNORMAL HIGH (ref 65–99)
Potassium: 3.1 mmol/L — ABNORMAL LOW (ref 3.5–5.2)
Sodium: 141 mmol/L (ref 134–144)

## 2019-11-23 NOTE — Progress Notes (Signed)
Contacted via Cecil evening Rielynn, so your electrolytes returned and your potassium is lower again.  I think the Hydrochlorothiazide is doing that.:( We could try going down to taking 1/2 tablet and see if this continues to happen, if it does then we will need to stop it.  Continue to increase potassium in diet too.  Could you try 1/2 a tablet for the next couple weeks and we can see how blood pressure does, than have you come in office for visit and lab recheck?  Are you okay with this? Keep being awesome!! Kindest regards, Jolene

## 2019-11-25 ENCOUNTER — Encounter: Payer: Self-pay | Admitting: Nurse Practitioner

## 2019-12-05 ENCOUNTER — Ambulatory Visit (INDEPENDENT_AMBULATORY_CARE_PROVIDER_SITE_OTHER): Payer: 59 | Admitting: Nurse Practitioner

## 2019-12-05 ENCOUNTER — Encounter: Payer: Self-pay | Admitting: Nurse Practitioner

## 2019-12-05 ENCOUNTER — Other Ambulatory Visit: Payer: Self-pay

## 2019-12-05 VITALS — BP 105/74 | HR 87 | Temp 98.3°F | Ht 61.8 in | Wt 154.2 lb

## 2019-12-05 DIAGNOSIS — R809 Proteinuria, unspecified: Secondary | ICD-10-CM

## 2019-12-05 DIAGNOSIS — I129 Hypertensive chronic kidney disease with stage 1 through stage 4 chronic kidney disease, or unspecified chronic kidney disease: Secondary | ICD-10-CM | POA: Diagnosis not present

## 2019-12-05 DIAGNOSIS — K219 Gastro-esophageal reflux disease without esophagitis: Secondary | ICD-10-CM

## 2019-12-05 DIAGNOSIS — J301 Allergic rhinitis due to pollen: Secondary | ICD-10-CM

## 2019-12-05 DIAGNOSIS — E7800 Pure hypercholesterolemia, unspecified: Secondary | ICD-10-CM | POA: Diagnosis not present

## 2019-12-05 DIAGNOSIS — R7303 Prediabetes: Secondary | ICD-10-CM | POA: Diagnosis not present

## 2019-12-05 DIAGNOSIS — Z1231 Encounter for screening mammogram for malignant neoplasm of breast: Secondary | ICD-10-CM

## 2019-12-05 DIAGNOSIS — Z0000 Encounter for general adult medical examination without abnormal findings: Secondary | ICD-10-CM | POA: Diagnosis not present

## 2019-12-05 DIAGNOSIS — E669 Obesity, unspecified: Secondary | ICD-10-CM

## 2019-12-05 DIAGNOSIS — R7989 Other specified abnormal findings of blood chemistry: Secondary | ICD-10-CM

## 2019-12-05 DIAGNOSIS — E559 Vitamin D deficiency, unspecified: Secondary | ICD-10-CM

## 2019-12-05 LAB — MICROALBUMIN, URINE WAIVED
Creatinine, Urine Waived: 300 mg/dL (ref 10–300)
Microalb, Ur Waived: 80 mg/L — ABNORMAL HIGH (ref 0–19)

## 2019-12-05 MED ORDER — ATORVASTATIN CALCIUM 20 MG PO TABS: 20.0000 mg | ORAL_TABLET | Freq: Every day | ORAL | 4 refills | Status: DC

## 2019-12-05 MED ORDER — HYDROCHLOROTHIAZIDE 12.5 MG PO TABS: 12.5000 mg | ORAL_TABLET | Freq: Every day | ORAL | 3 refills | Status: DC

## 2019-12-05 MED ORDER — OMEPRAZOLE 20 MG PO CPDR: 20.0000 mg | DELAYED_RELEASE_CAPSULE | Freq: Every day | ORAL | 4 refills | Status: DC

## 2019-12-05 NOTE — Assessment & Plan Note (Signed)
Chronic, stable.  Continue current medication regimen and adjust as needed.  Recommend avoiding triggers when possible.

## 2019-12-05 NOTE — Assessment & Plan Note (Signed)
Recommended eating smaller high protein, low fat meals more frequently and exercising 30 mins a day 5 times a week with a goal of 10-15lb weight loss in the next 3 months. Patient voiced their understanding and motivation to adhere to these recommendations.  

## 2019-12-05 NOTE — Assessment & Plan Note (Signed)
Recheck levels today and monitor closely

## 2019-12-05 NOTE — Assessment & Plan Note (Signed)
Chronic, stable.  Continue current medication regimen and adjust as needed.  Educated on risks of long term PPI use and wishes to continue.  Check Mag level.

## 2019-12-05 NOTE — Assessment & Plan Note (Addendum)
Check urine micro and kidney function today, adjust regimen as needed. Urine ALB 80 and A:C 30-300.  Has no current ACE or ARB due to past reactions.

## 2019-12-05 NOTE — Assessment & Plan Note (Signed)
Chronic, ongoing.  Check lipid panel.  Consider increasing Atorvastatin if LDL above goal.  Is not fasting.

## 2019-12-05 NOTE — Progress Notes (Addendum)
BP 105/74   Pulse 87   Temp 98.3 F (36.8 C) (Oral)   Ht 5' 1.8" (1.57 m)   Wt 154 lb 3.2 oz (69.9 kg)   LMP 10/14/1992 (Approximate)   SpO2 99%   BMI 28.39 kg/m    Subjective:    Patient ID: Linda Boyle, female    DOB: May 09, 1961, 59 y.o.   MRN: [id number]  HPI: Linda Boyle is a 59 y.o. female presenting on 12/05/2019 for comprehensive medical examination. Current medical complaints include:none  She currently lives with: daughter Menopausal Symptoms: no   HYPERTENSION Cough has presented with Lisinopril and Losartan.  Currently taking HCTZ 25 MG, along with her Verapamil.   Recent 3.1 with addition of HCTZ.  Continues on Atorvastatin 20 MG.  Takes Vitamin D supplement daily for low D levels. Hypertension status: controlled  Satisfied with current treatment? yes Duration of hypertension: chronic BP monitoring frequency:  daily BP range: <130/70 range at home BP medication side effects:  no Medication compliance: good compliance Aspirin: no Recurrent headaches: no Visual changes: no Palpitations: no Dyspnea: no Chest pain: no Lower extremity edema: no Dizzy/lightheaded: no The 10-year ASCVD risk score Mikey Bussing DC Jr., et al., 2013) is: 4%   Values used to calculate the score:     Age: 7 years     Sex: Female     Is Non-Hispanic African American: Yes     Diabetic: No     Tobacco smoker: No     Systolic Blood Pressure: 123456 mmHg     Is BP treated: Yes     HDL Cholesterol: 44 mg/dL     Total Cholesterol: 194 mg/dL   DIABETES A1C in July 5.9%, prior to this was 6.2%. Polydipsia/polyuria: no Visual disturbance: no Chest pain: no Paresthesias: no   GERD Takes Prilosec 20 MG daily.  Takes her Flonase and Allegra consistently for allergic rhinitis. GERD control status: stable  Satisfied with current treatment? yes Heartburn frequency: none Medication side effects: no  Medication compliance: stable Dysphagia: no Odynophagia:  no Hematemesis: no  Blood in stool: no EGD: yes  Depression Screen done today and results listed below:  Depression screen [facility] 2/9 12/05/2019 09/26/2019 09/13/2018 08/08/2017 07/24/2016  Decreased Interest 0 1 0 0 0  Down, Depressed, Hopeless 0 0 0 0 0  PHQ - 2 Score 0 1 0 0 0  Altered sleeping - - 0 0 -  Tired, decreased energy - - 0 0 -  Change in appetite - - 0 0 -  Feeling bad or failure about yourself  - - 0 1 -  Trouble concentrating - - 0 0 -  Moving slowly or fidgety/restless - - 0 0 -  Suicidal thoughts - - 0 0 -  PHQ-9 Score - - 0 1 -  Difficult doing work/chores - - Not difficult at all - -    The patient does not have a history of falls. I did not complete a risk assessment for falls. A plan of care for falls was not documented.   Past Medical History:  Past Medical History:  Diagnosis Date  . Allergy   . GERD (gastroesophageal reflux disease)   . Hyperlipidemia   . Hypertension   . Mitral valve prolapse   . Neck sprain   . Vertigo     Surgical History:  Past Surgical History:  Procedure Laterality Date  . ABDOMINAL HYSTERECTOMY     partial  . DILATION AND CURETTAGE OF UTERUS  [id number]  .  TOTAL VAGINAL HYSTERECTOMY  1994   Ovaries remain  . TUBAL LIGATION      Medications:  Current Outpatient Medications on File Prior to Visit  Medication Sig  . Cholecalciferol (VITAMIN D-3) 1000 UNITS CAPS Take 1,000 Units by mouth daily.  . fexofenadine (ALLEGRA) 180 MG tablet Take 1 tablet (180 mg total) by mouth daily as needed for allergies or rhinitis.  . fluticasone (FLONASE) 50 MCG/ACT nasal spray Place 2 sprays into both nostrils daily.  Marland Kitchen loratadine (CLARITIN) 10 MG tablet Take 10 mg by mouth daily.  . meclizine (ANTIVERT) 25 MG tablet Take 1 tablet (25 mg total) by mouth 3 (three) times daily as needed for dizziness.  . verapamil (VERELAN PM) 180 MG 24 hr capsule Take 1 capsule (180 mg total) by mouth at bedtime.   No current facility-administered medications on file prior to  visit.    Allergies:  Allergies  Allergen Reactions  . Lisinopril     cough  . Losartan     Social History:  Social History   Socioeconomic History  . Marital status: Married    Spouse name: Not on file  . Number of children: Not on file  . Years of education: Not on file  . Highest education level: Not on file  Occupational History  . Not on file  Tobacco Use  . Smoking status: Never Smoker  . Smokeless tobacco: Never Used  Substance and Sexual Activity  . Alcohol use: No  . Drug use: No  . Sexual activity: Not Currently  Other Topics Concern  . Not on file  Social History Narrative  . Not on file   Social Determinants of Health   Financial Resource Strain:   . Difficulty of Paying Living Expenses:   Food Insecurity:   . Worried About Charity fundraiser in the Last Year:   . Arboriculturist in the Last Year:   Transportation Needs:   . Film/video editor (Medical):   Marland Kitchen Lack of Transportation (Non-Medical):   Physical Activity:   . Days of Exercise per Week:   . Minutes of Exercise per Session:   Stress:   . Feeling of Stress :   Social Connections:   . Frequency of Communication with Friends and Family:   . Frequency of Social Gatherings with Friends and Family:   . Attends Religious Services:   . Active Member of Clubs or Organizations:   . Attends Archivist Meetings:   Marland Kitchen Marital Status:   Intimate Partner Violence:   . Fear of Current or Ex-Partner:   . Emotionally Abused:   Marland Kitchen Physically Abused:   . Sexually Abused:    Social History   Tobacco Use  Smoking Status Never Smoker  Smokeless Tobacco Never Used   Social History   Substance and Sexual Activity  Alcohol Use No    Family History:  Family History  Problem Relation Age of Onset  . Diabetes Mother   . Alzheimer's disease Father   . Hypertension Father   . Diabetes Sister   . Hypertension Brother   . Asthma Brother   . Diabetes Brother   . Hypertension Sister    . Breast cancer Neg Hx     Past medical history, surgical history, medications, allergies, family history and social history reviewed with patient today and changes made to appropriate areas of the chart.   Review of Systems - negative All other ROS negative except what is listed above and in  the HPI.      Objective:    BP 105/74   Pulse 87   Temp 98.3 F (36.8 C) (Oral)   Ht 5' 1.8" (1.57 m)   Wt 154 lb 3.2 oz (69.9 kg)   LMP 10/14/1992 (Approximate)   SpO2 99%   BMI 28.39 kg/m   Wt Readings from Last 3 Encounters:  12/05/19 154 lb 3.2 oz (69.9 kg)  11/21/19 154 lb 9.6 oz (70.1 kg)  10/24/19 159 lb (72.1 kg)    Physical Exam Constitutional:      General: She is awake. She is not in acute distress.    Appearance: She is well-developed. She is not ill-appearing.  HENT:     Head: Normocephalic and atraumatic.     Right Ear: Hearing, tympanic membrane, ear canal and external ear normal. No drainage.     Left Ear: Hearing, tympanic membrane, ear canal and external ear normal. No drainage.     Nose: Nose normal.     Right Sinus: No maxillary sinus tenderness or frontal sinus tenderness.     Left Sinus: No maxillary sinus tenderness or frontal sinus tenderness.     Mouth/Throat:     Mouth: Mucous membranes are moist.     Pharynx: Oropharynx is clear. Uvula midline. No pharyngeal swelling, oropharyngeal exudate or posterior oropharyngeal erythema.  Eyes:     General: Lids are normal.        Right eye: No discharge.        Left eye: No discharge.     Extraocular Movements: Extraocular movements intact.     Conjunctiva/sclera: Conjunctivae normal.     Pupils: Pupils are equal, round, and reactive to light.     Visual Fields: Right eye visual fields normal and left eye visual fields normal.  Neck:     Thyroid: No thyromegaly.     Vascular: No carotid bruit.     Trachea: Trachea normal.  Cardiovascular:     Rate and Rhythm: Normal rate and regular rhythm.     Heart sounds:  Normal heart sounds. No murmur. No gallop.   Pulmonary:     Effort: Pulmonary effort is normal. No accessory muscle usage or respiratory distress.     Breath sounds: Normal breath sounds.  Chest:     Breasts:        Right: Normal.        Left: Normal.  Abdominal:     General: Bowel sounds are normal.     Palpations: Abdomen is soft. There is no hepatomegaly or splenomegaly.     Tenderness: There is no abdominal tenderness.  Musculoskeletal:        General: Normal range of motion.     Cervical back: Normal range of motion and neck supple.     Right lower leg: No edema.     Left lower leg: No edema.  Lymphadenopathy:     Head:     Right side of head: No submental, submandibular, tonsillar, preauricular or posterior auricular adenopathy.     Left side of head: No submental, submandibular, tonsillar, preauricular or posterior auricular adenopathy.     Cervical: No cervical adenopathy.     Upper Body:     Right upper body: No supraclavicular, axillary or pectoral adenopathy.     Left upper body: No supraclavicular, axillary or pectoral adenopathy.  Skin:    General: Skin is warm and dry.     Capillary Refill: Capillary refill takes less than 2 seconds.  Findings: No rash.  Neurological:     Mental Status: She is alert and oriented to person, place, and time.     Cranial Nerves: Cranial nerves are intact.     Gait: Gait is intact.     Deep Tendon Reflexes: Reflexes are normal and symmetric.     Reflex Scores:      Brachioradialis reflexes are 2+ on the right side and 2+ on the left side.      Patellar reflexes are 2+ on the right side and 2+ on the left side. Psychiatric:        Attention and Perception: Attention normal.        Mood and Affect: Mood normal.        Speech: Speech normal.        Behavior: Behavior normal. Behavior is cooperative.        Thought Content: Thought content normal.        Judgment: Judgment normal.     Results for orders placed or performed in  visit on 12/05/19  Microalbumin, Urine Waived  Result Value Ref Range   Microalb, Ur Waived 80 (H) 0 - 19 mg/L   Creatinine, Urine Waived 300 10 - 300 mg/dL   Microalb/Creat Ratio 30-300 (H) <30 mg/g      Assessment & Plan:   Problem List Items Addressed This Visit      Respiratory   Allergic rhinitis    Chronic, stable.  Continue current medication regimen and adjust as needed.  Recommend avoiding triggers when possible.        Digestive   GERD (gastroesophageal reflux disease)    Chronic, stable.  Continue current medication regimen and adjust as needed.  Educated on risks of long term PPI use and wishes to continue.  Check Mag level.      Relevant Medications   omeprazole (PRILOSEC) 20 MG capsule   Other Relevant Orders   Magnesium     Genitourinary   Benign hypertensive renal disease    Chronic, ongoing with BP below goal today.  Does no tolerate ACE or ARB.  Recommend she continue to check BP at least 3 days at week at home and document.  Continue current medication regimen with Verapamil and HCTZ -- will reduce HCTZ to 12.5 MG due to low K+ on recent labs.  CMP today, if K+ low may need to consider discontinue HCTZ and increase Verapamil.  Return in 6 weeks for f/u.      Relevant Orders   CBC with Differential/Platelet   Comprehensive metabolic panel   TSH     Other   Hyperlipidemia    Chronic, ongoing.  Check lipid panel.  Consider increasing Atorvastatin if LDL above goal.  Is not fasting.      Relevant Medications   hydrochlorothiazide (HYDRODIURIL) 12.5 MG tablet   atorvastatin (LIPITOR) 20 MG tablet   Other Relevant Orders   Lipid Panel w/o Chol/HDL Ratio   Obesity (BMI 30-39.9)    Recommended eating smaller high protein, low fat meals more frequently and exercising 30 mins a day 5 times a week with a goal of 10-15lb weight loss in the next 3 months. Patient voiced their understanding and motivation to adhere to these recommendations.       Prediabetes     Recheck A1C today and continue focus on diet, initiate medication as needed dependent on A1C.      Relevant Orders   HgB A1c   Microalbuminuria    Check urine micro  and kidney function today, adjust regimen as needed. Urine ALB 80 and A:C 30-300.  Has no current ACE or ARB due to past reactions.      Relevant Orders   Microalbumin, Urine Waived (Completed)   Elevated LFTs    Recheck levels today and monitor closely       Other Visit Diagnoses    Encounter for annual physical exam    -  Primary   Vitamin D deficiency       History of low levels, recheck today and adjust supplement as needed   Relevant Orders   VITAMIN D 25 Hydroxy (Vit-D Deficiency, Fractures)   Encounter for screening mammogram for malignant neoplasm of breast       Relevant Orders   MM DIGITAL SCREENING BILATERAL       Follow up plan: Return in about 6 weeks (around 01/16/2020) for HTN.   LABORATORY TESTING:  - Pap smear: not applicable -- no cervix  IMMUNIZATIONS:   - Tdap: Tetanus vaccination status reviewed: last tetanus booster within 10 years. - Influenza: Up to date - Pneumovax: Not applicable - Prevnar: Not applicable - HPV: Not applicable - Zostavax vaccine: will talk to insurance  SCREENING: -Mammogram: Up to date  - Colonoscopy: Up to date  - Bone Density: Not applicable  -Hearing Test: Not applicable  -Spirometry: Not applicable   PATIENT COUNSELING:   Advised to take 1 mg of folate supplement per day if capable of pregnancy.   Sexuality: Discussed sexually transmitted diseases, partner selection, use of condoms, avoidance of unintended pregnancy  and contraceptive alternatives.   Advised to avoid cigarette smoking.  I discussed with the patient that most people either abstain from alcohol or drink within safe limits (<=14/week and <=4 drinks/occasion for males, <=7/weeks and <= 3 drinks/occasion for females) and that the risk for alcohol disorders and other health effects rises  proportionally with the number of drinks per week and how often a drinker exceeds daily limits.  Discussed cessation/primary prevention of drug use and availability of treatment for abuse.   Diet: Encouraged to adjust caloric intake to maintain  or achieve ideal body weight, to reduce intake of dietary saturated fat and total fat, to limit sodium intake by avoiding high sodium foods and not adding table salt, and to maintain adequate dietary potassium and calcium preferably from fresh fruits, vegetables, and low-fat dairy products.    stressed the importance of regular exercise  Injury prevention: Discussed safety belts, safety helmets, smoke detector, smoking near bedding or upholstery.   Dental health: Discussed importance of regular tooth brushing, flossing, and dental visits.    NEXT PREVENTATIVE PHYSICAL DUE IN 1 YEAR. Return in about 6 weeks (around 01/16/2020) for HTN.

## 2019-12-05 NOTE — Patient Instructions (Signed)
DASH Eating Plan DASH stands for "Dietary Approaches to Stop Hypertension." The DASH eating plan is a healthy eating plan that has been shown to reduce high blood pressure (hypertension). It may also reduce your risk for type 2 diabetes, heart disease, and stroke. The DASH eating plan may also help with weight loss. What are tips for following this plan?  General guidelines  Avoid eating more than 2,300 mg (milligrams) of salt (sodium) a day. If you have hypertension, you may need to reduce your sodium intake to 1,500 mg a day.  Limit alcohol intake to no more than 1 drink a day for nonpregnant women and 2 drinks a day for men. One drink equals 12 oz of beer, 5 oz of wine, or 1 oz of hard liquor.  Work with your health care provider to maintain a healthy body weight or to lose weight. Ask what an ideal weight is for you.  Get at least 30 minutes of exercise that causes your heart to beat faster (aerobic exercise) most days of the week. Activities may include walking, swimming, or biking.  Work with your health care provider or diet and nutrition specialist (dietitian) to adjust your eating plan to your individual calorie needs. Reading food labels   Check food labels for the amount of sodium per serving. Choose foods with less than 5 percent of the Daily Value of sodium. Generally, foods with less than 300 mg of sodium per serving fit into this eating plan.  To find whole grains, look for the word "whole" as the first word in the ingredient list. Shopping  Buy products labeled as "low-sodium" or "no salt added."  Buy fresh foods. Avoid canned foods and premade or frozen meals. Cooking  Avoid adding salt when cooking. Use salt-free seasonings or herbs instead of table salt or sea salt. Check with your health care provider or pharmacist before using salt substitutes.  Do not fry foods. Cook foods using healthy methods such as baking, boiling, grilling, and broiling instead.  Cook with  heart-healthy oils, such as olive, canola, soybean, or sunflower oil. Meal planning  Eat a balanced diet that includes: ? 5 or more servings of fruits and vegetables each day. At each meal, try to fill half of your plate with fruits and vegetables. ? Up to 6-8 servings of whole grains each day. ? Less than 6 oz of lean meat, poultry, or fish each day. A 3-oz serving of meat is about the same size as a deck of cards. One egg equals 1 oz. ? 2 servings of low-fat dairy each day. ? A serving of nuts, seeds, or beans 5 times each week. ? Heart-healthy fats. Healthy fats called Omega-3 fatty acids are found in foods such as flaxseeds and coldwater fish, like sardines, salmon, and mackerel.  Limit how much you eat of the following: ? Canned or prepackaged foods. ? Food that is high in trans fat, such as fried foods. ? Food that is high in saturated fat, such as fatty meat. ? Sweets, desserts, sugary drinks, and other foods with added sugar. ? Full-fat dairy products.  Do not salt foods before eating.  Try to eat at least 2 vegetarian meals each week.  Eat more home-cooked food and less restaurant, buffet, and fast food.  When eating at a restaurant, ask that your food be prepared with less salt or no salt, if possible. What foods are recommended? The items listed may not be a complete list. Talk with your dietitian about   what dietary choices are best for you. Grains Whole-grain or whole-wheat bread. Whole-grain or whole-wheat pasta. Brown rice. Oatmeal. Quinoa. Bulgur. Whole-grain and low-sodium cereals. Pita bread. Low-fat, low-sodium crackers. Whole-wheat flour tortillas. Vegetables Fresh or frozen vegetables (raw, steamed, roasted, or grilled). Low-sodium or reduced-sodium tomato and vegetable juice. Low-sodium or reduced-sodium tomato sauce and tomato paste. Low-sodium or reduced-sodium canned vegetables. Fruits All fresh, dried, or frozen fruit. Canned fruit in natural juice (without  added sugar). Meat and other protein foods Skinless chicken or turkey. Ground chicken or turkey. Pork with fat trimmed off. Fish and seafood. Egg whites. Dried beans, peas, or lentils. Unsalted nuts, nut butters, and seeds. Unsalted canned beans. Lean cuts of beef with fat trimmed off. Low-sodium, lean deli meat. Dairy Low-fat (1%) or fat-free (skim) milk. Fat-free, low-fat, or reduced-fat cheeses. Nonfat, low-sodium ricotta or cottage cheese. Low-fat or nonfat yogurt. Low-fat, low-sodium cheese. Fats and oils Soft margarine without trans fats. Vegetable oil. Low-fat, reduced-fat, or light mayonnaise and salad dressings (reduced-sodium). Canola, safflower, olive, soybean, and sunflower oils. Avocado. Seasoning and other foods Herbs. Spices. Seasoning mixes without salt. Unsalted popcorn and pretzels. Fat-free sweets. What foods are not recommended? The items listed may not be a complete list. Talk with your dietitian about what dietary choices are best for you. Grains Baked goods made with fat, such as croissants, muffins, or some breads. Dry pasta or rice meal packs. Vegetables Creamed or fried vegetables. Vegetables in a cheese sauce. Regular canned vegetables (not low-sodium or reduced-sodium). Regular canned tomato sauce and paste (not low-sodium or reduced-sodium). Regular tomato and vegetable juice (not low-sodium or reduced-sodium). Pickles. Olives. Fruits Canned fruit in a light or heavy syrup. Fried fruit. Fruit in cream or butter sauce. Meat and other protein foods Fatty cuts of meat. Ribs. Fried meat. Bacon. Sausage. Bologna and other processed lunch meats. Salami. Fatback. Hotdogs. Bratwurst. Salted nuts and seeds. Canned beans with added salt. Canned or smoked fish. Whole eggs or egg yolks. Chicken or turkey with skin. Dairy Whole or 2% milk, cream, and half-and-half. Whole or full-fat cream cheese. Whole-fat or sweetened yogurt. Full-fat cheese. Nondairy creamers. Whipped toppings.  Processed cheese and cheese spreads. Fats and oils Butter. Stick margarine. Lard. Shortening. Ghee. Bacon fat. Tropical oils, such as coconut, palm kernel, or palm oil. Seasoning and other foods Salted popcorn and pretzels. Onion salt, garlic salt, seasoned salt, table salt, and sea salt. Worcestershire sauce. Tartar sauce. Barbecue sauce. Teriyaki sauce. Soy sauce, including reduced-sodium. Steak sauce. Canned and packaged gravies. Fish sauce. Oyster sauce. Cocktail sauce. Horseradish that you find on the shelf. Ketchup. Mustard. Meat flavorings and tenderizers. Bouillon cubes. Hot sauce and Tabasco sauce. Premade or packaged marinades. Premade or packaged taco seasonings. Relishes. Regular salad dressings. Where to find more information:  National Heart, Lung, and Blood Institute: www.nhlbi.nih.gov  American Heart Association: www.heart.org Summary  The DASH eating plan is a healthy eating plan that has been shown to reduce high blood pressure (hypertension). It may also reduce your risk for type 2 diabetes, heart disease, and stroke.  With the DASH eating plan, you should limit salt (sodium) intake to 2,300 mg a day. If you have hypertension, you may need to reduce your sodium intake to 1,500 mg a day.  When on the DASH eating plan, aim to eat more fresh fruits and vegetables, whole grains, lean proteins, low-fat dairy, and heart-healthy fats.  Work with your health care provider or diet and nutrition specialist (dietitian) to adjust your eating plan to your   individual calorie needs. This information is not intended to replace advice given to you by your health care provider. Make sure you discuss any questions you have with your health care provider. Document Revised: 06/08/2017 Document Reviewed: 06/19/2016 Elsevier Patient Education  2020 [employer]  

## 2019-12-05 NOTE — Assessment & Plan Note (Signed)
Chronic, ongoing with BP below goal today.  Does no tolerate ACE or ARB.  Recommend she continue to check BP at least 3 days at week at home and document.  Continue current medication regimen with Verapamil and HCTZ -- will reduce HCTZ to 12.5 MG due to low K+ on recent labs.  CMP today, if K+ low may need to consider discontinue HCTZ and increase Verapamil.  Return in 6 weeks for f/u.

## 2019-12-05 NOTE — Assessment & Plan Note (Signed)
Recheck A1C today and continue focus on diet, initiate medication as needed dependent on A1C.

## 2019-12-06 LAB — CBC WITH DIFFERENTIAL/PLATELET
Basophils Absolute: 0 10*3/uL (ref 0.0–0.2)
Basos: 1 %
EOS (ABSOLUTE): 0.1 10*3/uL (ref 0.0–0.4)
Eos: 2 %
Hematocrit: 37.8 % (ref 34.0–46.6)
Hemoglobin: 12.5 g/dL (ref 11.1–15.9)
Immature Grans (Abs): 0 10*3/uL (ref 0.0–0.1)
Immature Granulocytes: 0 %
Lymphocytes Absolute: 1.9 10*3/uL (ref 0.7–3.1)
Lymphs: 36 %
MCH: 29 pg (ref 26.6–33.0)
MCHC: 33.1 g/dL (ref 31.5–35.7)
MCV: 88 fL (ref 79–97)
Monocytes Absolute: 0.4 10*3/uL (ref 0.1–0.9)
Monocytes: 7 %
Neutrophils Absolute: 2.9 10*3/uL (ref 1.4–7.0)
Neutrophils: 54 %
Platelets: 325 10*3/uL (ref 150–450)
RBC: 4.31 x10E6/uL (ref 3.77–5.28)
RDW: 13.3 % (ref 11.7–15.4)
WBC: 5.3 10*3/uL (ref 3.4–10.8)

## 2019-12-06 LAB — COMPREHENSIVE METABOLIC PANEL
ALT: 43 IU/L — ABNORMAL HIGH (ref 0–32)
AST: 38 IU/L (ref 0–40)
Albumin/Globulin Ratio: 1.6 (ref 1.2–2.2)
Albumin: 4.3 g/dL (ref 3.8–4.9)
Alkaline Phosphatase: 77 IU/L (ref 48–121)
BUN/Creatinine Ratio: 16 (ref 9–23)
BUN: 16 mg/dL (ref 6–24)
Bilirubin Total: 0.4 mg/dL (ref 0.0–1.2)
CO2: 28 mmol/L (ref 20–29)
Calcium: 9.6 mg/dL (ref 8.7–10.2)
Chloride: 101 mmol/L (ref 96–106)
Creatinine, Ser: 1.02 mg/dL — ABNORMAL HIGH (ref 0.57–1.00)
GFR calc Af Amer: 70 mL/min/{1.73_m2} (ref >59)
GFR calc non Af Amer: 60 mL/min/{1.73_m2} (ref >59)
Globulin, Total: 2.7 g/dL (ref 1.5–4.5)
Glucose: 122 mg/dL — ABNORMAL HIGH (ref 65–99)
Potassium: 2.9 mmol/L — ABNORMAL LOW (ref 3.5–5.2)
Sodium: 143 mmol/L (ref 134–144)
Total Protein: 7 g/dL (ref 6.0–8.5)

## 2019-12-06 LAB — HEMOGLOBIN A1C
Est. average glucose Bld gHb Est-mCnc: 140 mg/dL
Hgb A1c MFr Bld: 6.5 % — ABNORMAL HIGH (ref 4.8–5.6)

## 2019-12-06 LAB — LIPID PANEL W/O CHOL/HDL RATIO
Cholesterol, Total: 190 mg/dL (ref 100–199)
HDL: 45 mg/dL (ref >39)
LDL Chol Calc (NIH): 117 mg/dL — ABNORMAL HIGH (ref 0–99)
Triglycerides: 161 mg/dL — ABNORMAL HIGH (ref 0–149)
VLDL Cholesterol Cal: 28 mg/dL (ref 5–40)

## 2019-12-06 LAB — TSH: TSH: 1.76 u[IU]/mL (ref 0.450–4.500)

## 2019-12-06 LAB — MAGNESIUM: Magnesium: 2.2 mg/dL (ref 1.6–2.3)

## 2019-12-06 LAB — VITAMIN D 25 HYDROXY (VIT D DEFICIENCY, FRACTURES): Vit D, 25-Hydroxy: 43.8 ng/mL (ref 30.0–100.0)

## 2019-12-07 NOTE — Progress Notes (Signed)
Contacted via [facility] evening October.  I have your lab results: - Your A1C is now 6.5%, this is considered diabetes.  Anything 6.5% or greater is considered diabetes.  We could try a focus on diet, but I would recommend starting a small dose of Metformin as well.  This can help your body recognize to use the insulin it makes.  We could start out with low dose, which would help with side effects (we sometimes see belly upset when starting) and move up as needed.  Thoughts? - Your potassium is now 2.9 -- We will need to STOP the hydrochlorothiazide, although it helps your blood pressure we can not push your potassium down. Please stop this and we will adjust medications as needed based on your blood pressure. - Cholesterol levels are above goal, are you taking Atorvastatin every day? - Remainder of labs look fantastic. Keep being awesome!! Kindest regards, Leotha Voeltz

## 2019-12-09 ENCOUNTER — Other Ambulatory Visit: Payer: Self-pay | Admitting: Nurse Practitioner

## 2019-12-09 DIAGNOSIS — E876 Hypokalemia: Secondary | ICD-10-CM

## 2019-12-09 MED ORDER — METFORMIN HCL 500 MG PO TABS
ORAL_TABLET | ORAL | 3 refills | Status: DC
Start: 2019-12-09 — End: 2020-01-21

## 2019-12-09 NOTE — Progress Notes (Signed)
K+ recheck

## 2019-12-09 NOTE — Progress Notes (Signed)
Please check to see if she received MyChart message: Good evening Linda Boyle. I have your lab results: - Your A1C is now 6.5%, this is considered diabetes. Anything 6.5% or greater is considered diabetes. We could try a focus on diet, but I would recommend starting a small dose of Metformin as well. This can help your body recognize to use the insulin it makes. We could start out with low dose, which would help with side effects (we sometimes see belly upset when starting) and move up as needed. Thoughts? - Your potassium is now 2.9 -- We will need to STOP the hydrochlorothiazide, although it helps your blood pressure we can not push your potassium down. Please stop this and we will adjust medications as needed based on your blood pressure. - Cholesterol levels are above goal, are you taking Atorvastatin every day? - Remainder of labs look fantastic. Keep being awesome!! Kindest regards, Orell Hurtado

## 2019-12-09 NOTE — Progress Notes (Signed)
Metformin script.

## 2019-12-11 ENCOUNTER — Ambulatory Visit
Admission: RE | Admit: 2019-12-11 | Discharge: 2019-12-11 | Disposition: A | Discharge status: Home / Self Care | Payer: 59 | Source: Ambulatory Visit | Attending: Nurse Practitioner | Admitting: Nurse Practitioner

## 2019-12-11 DIAGNOSIS — Z1231 Encounter for screening mammogram for malignant neoplasm of breast: Secondary | ICD-10-CM | POA: Diagnosis not present

## 2019-12-17 ENCOUNTER — Other Ambulatory Visit: Payer: 59

## 2019-12-17 ENCOUNTER — Other Ambulatory Visit: Payer: Self-pay

## 2019-12-17 DIAGNOSIS — E876 Hypokalemia: Secondary | ICD-10-CM

## 2019-12-18 LAB — BASIC METABOLIC PANEL
BUN/Creatinine Ratio: 13 (ref 9–23)
BUN: 10 mg/dL (ref 6–24)
CO2: 22 mmol/L (ref 20–29)
Calcium: 9 mg/dL (ref 8.7–10.2)
Chloride: 103 mmol/L (ref 96–106)
Creatinine, Ser: 0.78 mg/dL (ref 0.57–1.00)
GFR calc Af Amer: 96 mL/min/{1.73_m2} (ref >59)
GFR calc non Af Amer: 83 mL/min/{1.73_m2} (ref >59)
Glucose: 94 mg/dL (ref 65–99)
Potassium: 3.5 mmol/L (ref 3.5–5.2)
Sodium: 139 mmol/L (ref 134–144)

## 2019-12-18 NOTE — Progress Notes (Signed)
Contacted via [facility] morning Linda Boyle, well as expected we can not use Hydrochlorothiazide with you.  Unfortunately.  It works well on your blood pressure, but pushed down your potassium.  Levels are much improved without it on board and you are back to normal.  Monitor your blood pressure and if elevations we will come up with different plan.  Have a great day!! Keep being awesome!! Kindest regards, Joanmarie Tsang

## 2020-01-14 IMAGING — MG DIGITAL SCREENING BILATERAL MAMMOGRAM WITH TOMO AND CAD
6 of 10 series · 6 of 30 positions shown · non-contrast
Comparison: Previous exam(s).

CLINICAL DATA: Screening.

EXAM:
DIGITAL SCREENING BILATERAL MAMMOGRAM WITH TOMO AND CAD

[R MLO synth-2D (1 of 2)]
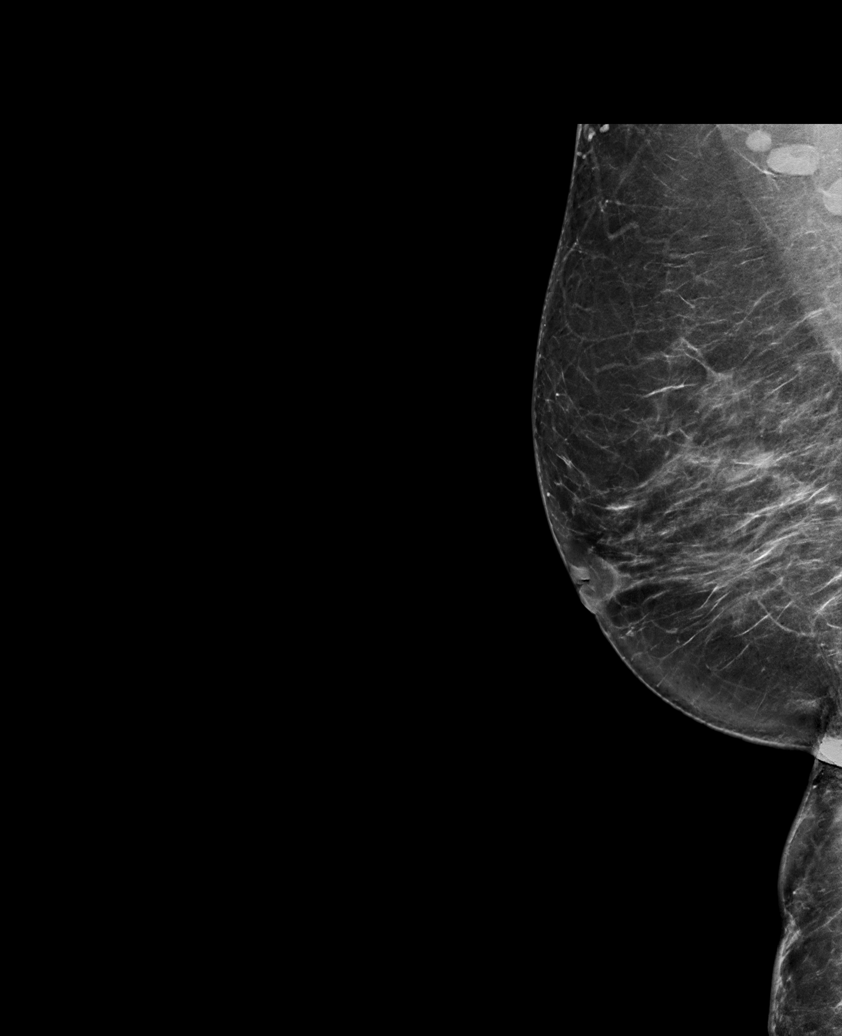

[R MLO synth-2D (2 of 2)]
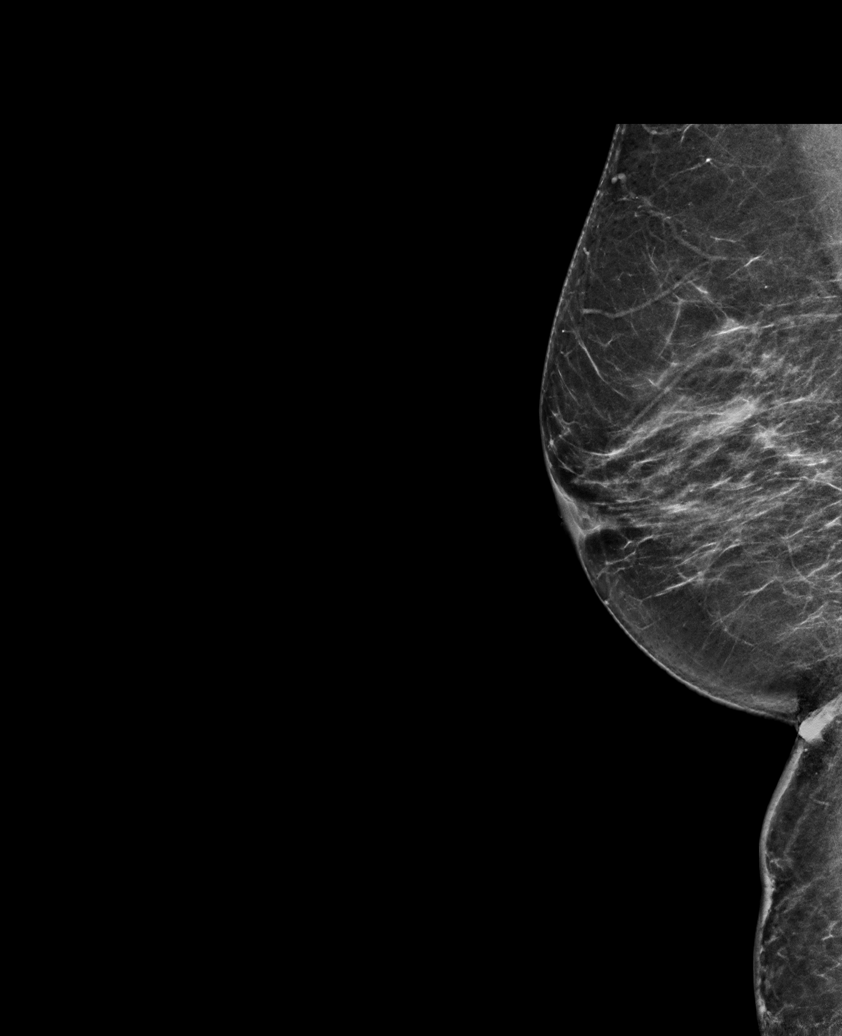

[L CC synth-2D]
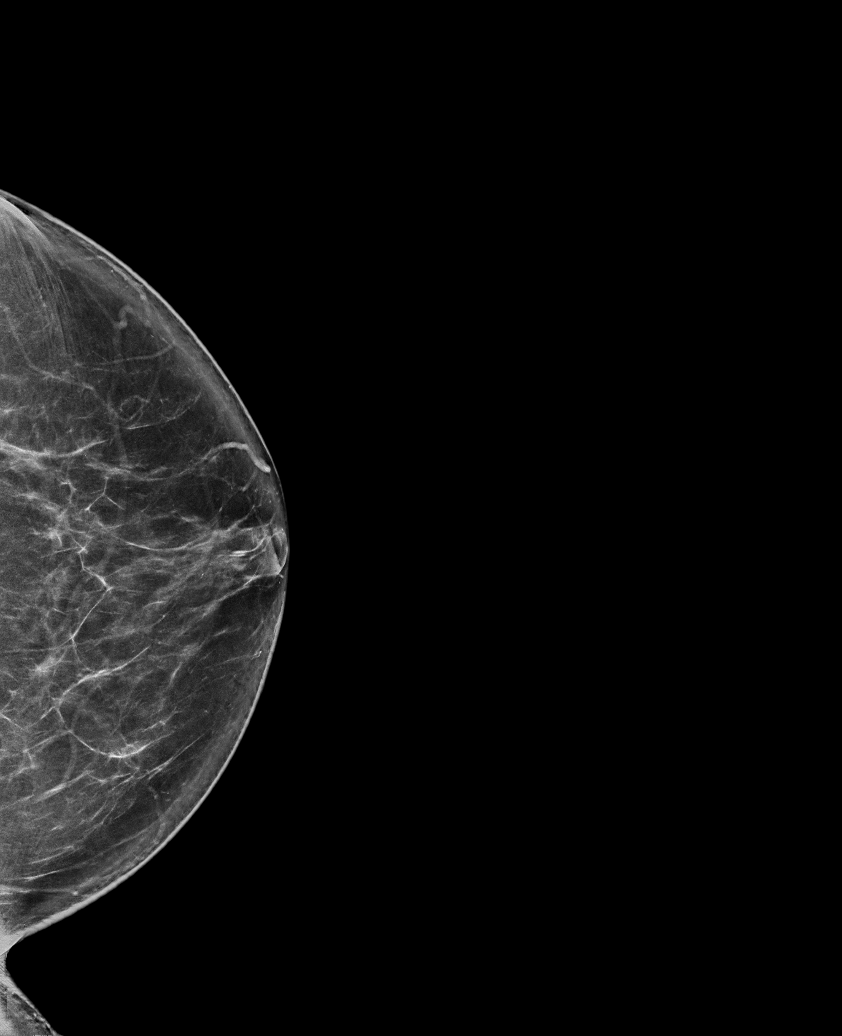

[R CC synth-2D]
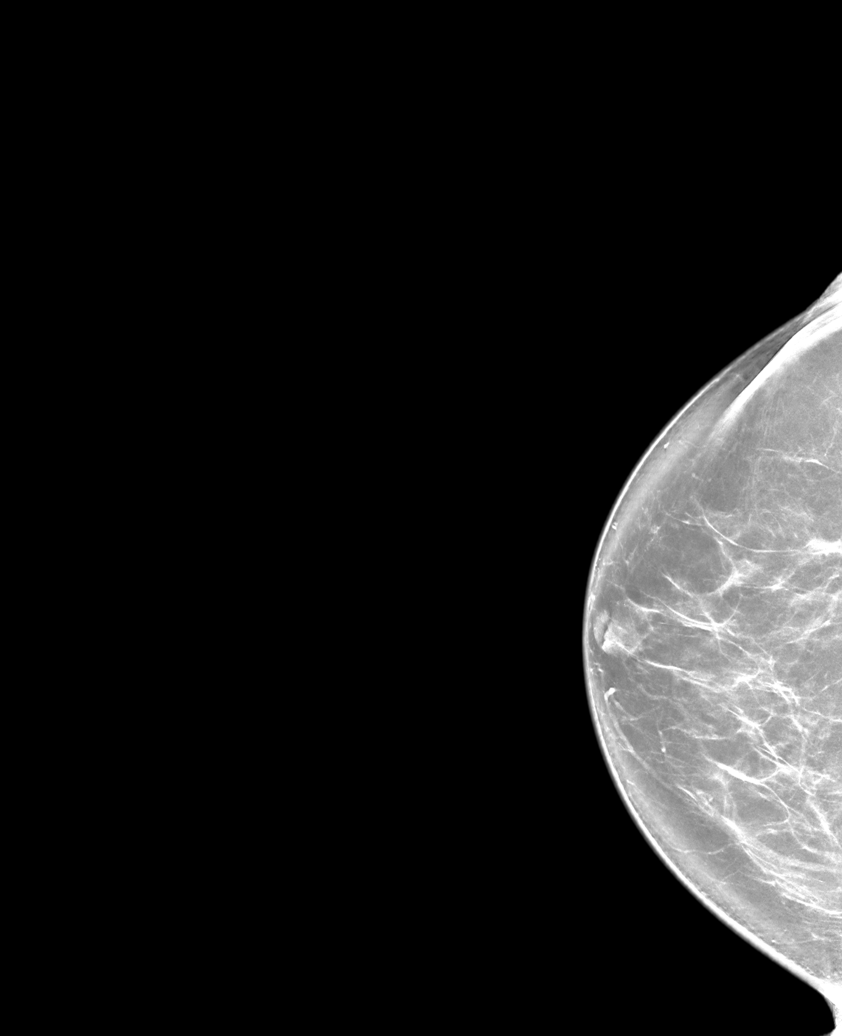

[L MLO synth-2D]
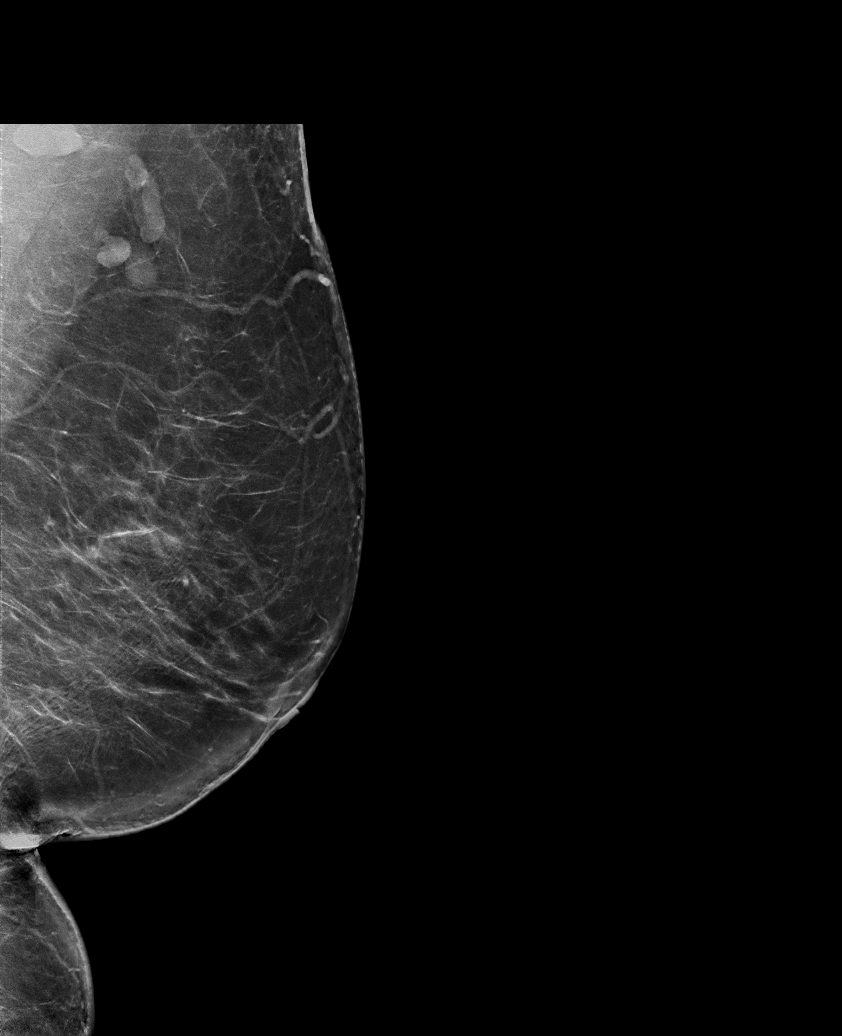

[L MLO tomo · tomo slice 47/92.0]
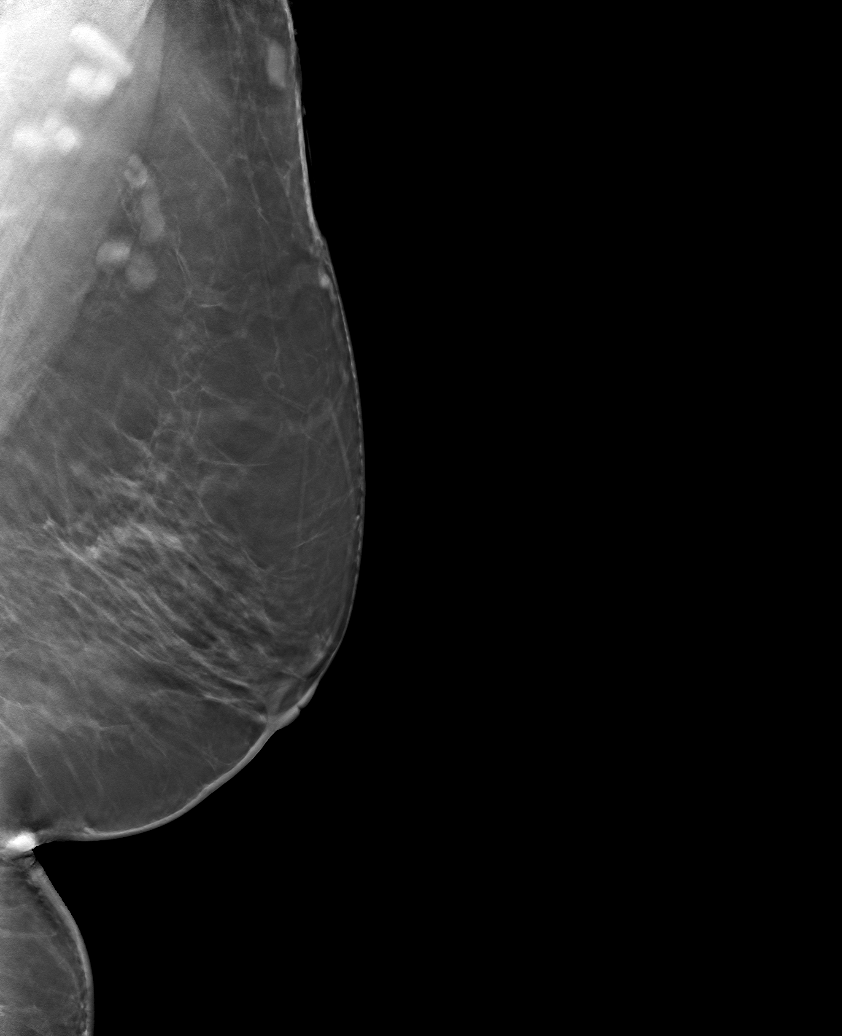

[6 of 30 positions shown; findings below may reference images not displayed]

ACR Breast Density Category b: There are scattered areas of
fibroglandular density.
FINDINGS: There are no findings suspicious for malignancy. Images were
processed with CAD.
IMPRESSION: No mammographic evidence of malignancy. A result letter of this
screening mammogram will be mailed directly to the patient.

RECOMMENDATION:
Screening mammogram in one year. (Code:[id number])

BI-RADS CATEGORY  1: Negative.

## 2020-01-21 ENCOUNTER — Ambulatory Visit (INDEPENDENT_AMBULATORY_CARE_PROVIDER_SITE_OTHER): Payer: 59 | Admitting: Nurse Practitioner

## 2020-01-21 ENCOUNTER — Encounter: Payer: Self-pay | Admitting: Nurse Practitioner

## 2020-01-21 ENCOUNTER — Other Ambulatory Visit: Payer: Self-pay

## 2020-01-21 VITALS — BP 132/84 | HR 71 | Temp 98.0°F | Wt 152.0 lb

## 2020-01-21 DIAGNOSIS — E1169 Type 2 diabetes mellitus with other specified complication: Secondary | ICD-10-CM

## 2020-01-21 DIAGNOSIS — R7303 Prediabetes: Secondary | ICD-10-CM

## 2020-01-21 DIAGNOSIS — I129 Hypertensive chronic kidney disease with stage 1 through stage 4 chronic kidney disease, or unspecified chronic kidney disease: Secondary | ICD-10-CM

## 2020-01-21 DIAGNOSIS — E669 Obesity, unspecified: Secondary | ICD-10-CM

## 2020-01-21 MED ORDER — BLOOD GLUCOSE MONITOR KIT: PACK | 0 refills | Status: DC

## 2020-01-21 NOTE — Assessment & Plan Note (Signed)
Chronic, ongoing with BP at goal today.  Does no tolerate ACE or ARB -- cough and HCTZ drops K+.  Recommend she continue to check BP at least 3 days at week at home and document.  Continue current medication regimen with Verapamil.  Return in 2 months for f/u.

## 2020-01-21 NOTE — Patient Instructions (Signed)

## 2020-01-21 NOTE — Assessment & Plan Note (Signed)
Recent A1C elevated at 6.5%, diabetes range.  She was provided Metformin, but has not taken.  Wishes to focus on diet and exercise at this time, has lost a few pounds.  Will placed referral to diabetes education which she is interested in and provided script for glucometer, recommend she check every morning with goal <130.  Plan to recheck A1C in September.

## 2020-01-21 NOTE — Assessment & Plan Note (Signed)
Has lost a few pounds, palsied for this.  Recommended eating smaller high protein, low fat meals more frequently and exercising 30 mins a day 5 times a week with a goal of 10-15lb weight loss in the next 3 months. Patient voiced their understanding and motivation to adhere to these recommendations.

## 2020-01-21 NOTE — Progress Notes (Signed)
BP 132/84 (BP Location: Left Arm, Cuff Size: Normal)   Pulse 71   Temp 98 F (36.7 C) (Oral)   Wt 152 lb (68.9 kg)   LMP 10/14/1992 (Approximate)   SpO2 99%   BMI 27.98 kg/m    Subjective:    Patient ID: Linda Boyle, female    DOB: [birth year], 59 y.o.   MRN: [id number]  HPI: Linda Boyle is a 59 y.o. female  Chief Complaint  Patient presents with  . Hypertension   HYPERTENSION  Continues on Verapamil 180 MG daily at bedtime.  She reports that she has been walking the track every day.  Has tried ACE/ARB with cough and HCTZ drops K+.  Recent A1C ib 12/05/19 == 6.5% and is not taking Metformin -- wishes to focus on diet and exercise.   Hypertension status: stable  Satisfied with current treatment? yes Duration of hypertension: chronic BP monitoring frequency:  daily BP range: 132/70's at home on rechecks BP medication side effects:  no Medication compliance: good compliance Aspirin: no Recurrent headaches: no Visual changes: no Palpitations: no Dyspnea: no Chest pain: no Lower extremity edema: no Dizzy/lightheaded: no  Relevant past medical, surgical, family and social history reviewed and updated as indicated. Interim medical history since our last visit reviewed. Allergies and medications reviewed and updated.  Review of Systems  Constitutional: Negative for activity change, appetite change, diaphoresis, fatigue and fever.  Respiratory: Negative for cough, chest tightness and shortness of breath.   Cardiovascular: Negative for chest pain, palpitations and leg swelling.  Gastrointestinal: Negative.   Neurological: Negative.   Psychiatric/Behavioral: Negative.     Per HPI unless specifically indicated above     Objective:    BP 132/84 (BP Location: Left Arm, Cuff Size: Normal)   Pulse 71   Temp 98 F (36.7 C) (Oral)   Wt 152 lb (68.9 kg)   LMP 10/14/1992 (Approximate)   SpO2 99%   BMI 27.98 kg/m   Wt Readings from Last 3 Encounters:  01/21/20  152 lb (68.9 kg)  12/05/19 154 lb 3.2 oz (69.9 kg)  11/21/19 154 lb 9.6 oz (70.1 kg)    Physical Exam Vitals and nursing note reviewed.  Constitutional:      General: She is awake. She is not in acute distress.    Appearance: She is well-developed and well-groomed. She is obese. She is not ill-appearing.  HENT:     Head: Normocephalic.     Right Ear: Hearing normal.     Left Ear: Hearing normal.  Eyes:     General: Lids are normal.        Right eye: No discharge.        Left eye: No discharge.     Conjunctiva/sclera: Conjunctivae normal.     Pupils: Pupils are equal, round, and reactive to light.  Neck:     Vascular: No carotid bruit.  Cardiovascular:     Rate and Rhythm: Normal rate and regular rhythm.     Heart sounds: Normal heart sounds. No murmur heard.  No gallop.   Pulmonary:     Effort: Pulmonary effort is normal. No accessory muscle usage or respiratory distress.     Breath sounds: Normal breath sounds.  Abdominal:     General: Bowel sounds are normal.     Palpations: Abdomen is soft. There is no hepatomegaly or splenomegaly.  Musculoskeletal:     Cervical back: Normal range of motion and neck supple.     Right lower leg:  No edema.     Left lower leg: No edema.  Skin:    General: Skin is warm and dry.  Neurological:     Mental Status: She is alert and oriented to person, place, and time.  Psychiatric:        Attention and Perception: Attention normal.        Mood and Affect: Mood normal.        Behavior: Behavior normal. Behavior is cooperative.        Thought Content: Thought content normal.        Judgment: Judgment normal.     Results for orders placed or performed in visit on 73/53/29  Basic metabolic panel  Result Value Ref Range   Glucose 94 65 - 99 mg/dL   BUN 10 6 - 24 mg/dL   Creatinine, Ser 0.78 0.57 - 1.00 mg/dL   GFR calc non Af Amer 83 >59 mL/min/1.73   GFR calc Af Amer 96 >59 mL/min/1.73   BUN/Creatinine Ratio 13 9 - 23   Sodium 139 134  - 144 mmol/L   Potassium 3.5 3.5 - 5.2 mmol/L   Chloride 103 96 - 106 mmol/L   CO2 22 20 - 29 mmol/L   Calcium 9.0 8.7 - 10.2 mg/dL      Assessment & Plan:   Problem List Items Addressed This Visit      Genitourinary   Benign hypertensive renal disease - Primary    Chronic, ongoing with BP at goal today.  Does no tolerate ACE or ARB -- cough and HCTZ drops K+.  Recommend she continue to check BP at least 3 days at week at home and document.  Continue current medication regimen with Verapamil.  Return in 2 months for f/u.        Other   Obesity (BMI 30-39.9)    Has lost a few pounds, palsied for this.  Recommended eating smaller high protein, low fat meals more frequently and exercising 30 mins a day 5 times a week with a goal of 10-15lb weight loss in the next 3 months. Patient voiced their understanding and motivation to adhere to these recommendations.       Prediabetes    Recent A1C elevated at 6.5%, diabetes range.  She was provided Metformin, but has not taken.  Wishes to focus on diet and exercise at this time, has lost a few pounds.  Will placed referral to diabetes education which she is interested in and provided script for glucometer, recommend she check every morning with goal <130.  Plan to recheck A1C in September.      Relevant Orders   Ambulatory referral to diabetic education       Follow up plan: Return in about 2 months (around 03/23/2020) for A1C check.

## 2020-02-02 ENCOUNTER — Other Ambulatory Visit: Payer: Self-pay | Admitting: Family Medicine

## 2020-02-11 ENCOUNTER — Encounter: Payer: Self-pay | Admitting: Nurse Practitioner

## 2020-02-19 ENCOUNTER — Other Ambulatory Visit: Payer: Self-pay

## 2020-02-19 ENCOUNTER — Encounter: Payer: 59 | Attending: Nurse Practitioner | Admitting: *Deleted

## 2020-02-19 ENCOUNTER — Encounter: Payer: Self-pay | Admitting: *Deleted

## 2020-02-19 VITALS — BP 138/80 | Ht 61.0 in | Wt 152.8 lb

## 2020-02-19 DIAGNOSIS — E119 Type 2 diabetes mellitus without complications: Secondary | ICD-10-CM | POA: Diagnosis not present

## 2020-02-19 NOTE — Progress Notes (Signed)
Diabetes Self-Management Education  Visit Type: First/Initial  Appt. Start Time: 1530 Appt. End Time: 1440  02/19/2020  Ms. Vinie Charity, identified by name and date of birth, is a 59 y.o. female with a diagnosis of Diabetes: Type 2.   ASSESSMENT  Blood pressure 138/80, height 5\' 1"  (1.549 m), weight 152 lb 12.8 oz (69.3 kg), last menstrual period 10/14/1992. Body mass index is 28.87 kg/m.   Diabetes Self-Management Education - 02/19/20 1644      Visit Information   Visit Type First/Initial      Initial Visit   Diabetes Type Type 2    Are you currently following a meal plan? Yes    What type of meal plan do you follow? reduced portion size, limired carb and salt consumption    Are you taking your medications as prescribed? Yes   Date Diagnosed 2 months ago      Health Coping   How would you rate your overall health? Fair      Psychosocial Assessment   Patient Belief/Attitude about Diabetes Motivated to manage diabetes   "I don't like it and I don't want it"   Self-care barriers None    Self-management support Doctor's office;Family    Patient Concerns Nutrition/Meal planning    Special Needs None    Preferred Learning Style Auditory;Visual;Hands on    Boyce in progress    How often do you need to have someone help you when you read instructions, pamphlets, or other written materials from your doctor or pharmacy? 1 - Never    What is the last grade level you completed in school? 12th      Pre-Education Assessment   Patient understands the diabetes disease and treatment process. Needs Instruction    Patient understands incorporating nutritional management into lifestyle. Needs Instruction    Patient undertands incorporating physical activity into lifestyle. Needs Review    Patient understands using medications safely. Needs Instruction    Patient understands monitoring blood glucose, interpreting and using results Needs Review    Patient understands  prevention, detection, and treatment of acute complications. Needs Instruction    Patient understands prevention, detection, and treatment of chronic complications. Needs Instruction    Patient understands how to develop strategies to address psychosocial issues. Needs Instruction    Patient understands how to develop strategies to promote health/change behavior. Needs Instruction      Complications   Last HgB A1C per patient/outside source 6.5 %   12/05/2019   How often do you check your blood sugar? 3-4 times / week    Fasting Blood glucose range (mg/dL) 70-129   Pt reports FBG's 99-111 mg/dL   Postprandial Blood glucose range (mg/dL) 70-129   Pt reports pp's 90's - low 100's mg/dL.   Have you had a dilated eye exam in the past 12 months? No    Have you had a dental exam in the past 12 months? No    Are you checking your feet? No      Dietary Intake   Breakfast 10 am meal of cheerios and milk with sugar; egg sandwich with cheese, onions, peppers    Snack (morning) 8 am snack of dried cranberries and cashews after walking; may have snack at 11:30 am - fruit (watermelon, cantaloupe, apple sauce, honey dew, apple, piniapple, strawberries)    Lunch skips or eats fruit, [place] sandwich with lettuce and cheese or left overs from supper    Snack (afternoon) 4 pm fruit, yogurt  Dinner chicken, pork 2 x month, fish, potatoes, beets, carrots, beans, green beans, cabbace, spinach broccoli, occasional pasta    Snack (evening) 9 pm fruit    Beverage(s) water, juice      Exercise   Exercise Type Light (walking / raking leaves)    How many days per week to you exercise? 3.5    How many minutes per day do you exercise? 60    Total minutes per week of exercise 210      Patient Education   Previous Diabetes Education No    Disease state  Definition of diabetes, type 1 and 2, and the diagnosis of diabetes;Factors that contribute to the development of diabetes    Nutrition management  Role of diet in  the treatment of diabetes and the relationship between the three main macronutrients and blood glucose level;Food label reading, portion sizes and measuring food.;Reviewed blood glucose goals for pre and post meals and how to evaluate the patients' food intake on their blood glucose level.    Physical activity and exercise  Role of exercise on diabetes management, blood pressure control and cardiac health.    Medications Reviewed patients medication for diabetes, action, purpose, timing of dose and side effects.    Monitoring Purpose and frequency of SMBG.;Taught/discussed recording of test results and interpretation of SMBG.;Identified appropriate SMBG and/or A1C goals.    Chronic complications Relationship between chronic complications and blood glucose control    Psychosocial adjustment Identified and addressed patients feelings and concerns about diabetes      Individualized Goals (developed by patient)   Reducing Risk Other (comment)   improve blood sugars, decrease medications, prevent diabetes complications, lose weight, lead a healthier lifestyle, become more fit     Outcomes   Expected Outcomes Demonstrated interest in learning. Expect positive outcomes    Future DMSE 2 wks           Individualized Plan for Diabetes Self-Management Training:   Learning Objective:  Patient will have a greater understanding of diabetes self-management. Patient education plan is to attend individual and/or group sessions per assessed needs and concerns.   Plan:   Patient Instructions  Check blood sugars 1 x day before breakfast or 2 hrs after one meal - 3 x week Bring blood sugar records to the next class Exercise: Continue walking  for  60  minutes  3-4 days a week Eat 3 meals day, 1-2 snacks a day Space meals 4-6 hours apart Don't skip meals Allow 2-3 hours between meals and snacks Continue to limit sugar in drinks (soda, juices) and on cereal Make an eye doctor appointment  Expected  Outcomes:  Demonstrated interest in learning. Expect positive outcomes  Education material provided:  General Meal Planning Guidelines Simple Meal Plan  If problems or questions, patient to contact team via:  Johny Drilling, RN, [place] [phone]  Future DSME appointment: 2 wks  March 04, 2020 for Diabetes Class 1

## 2020-02-19 NOTE — Patient Instructions (Addendum)
Check blood sugars 1 x day before breakfast or 2 hrs after one meal - 3 x week Bring blood sugar records to the next class  Exercise: Continue walking  for  60  minutes  3-4 days a week  Eat 3 meals day, 1-2 snacks a day Space meals 4-6 hours apart Don't skip meals Allow 2-3 hours between meals and snacks Continue to limit sugar in drinks (soda, juices) and on cereal  Make an eye doctor appointment  Return for classes on:

## 2020-03-04 ENCOUNTER — Encounter: Payer: 59 | Admitting: Dietician

## 2020-03-04 ENCOUNTER — Encounter: Payer: Self-pay | Admitting: Dietician

## 2020-03-04 ENCOUNTER — Other Ambulatory Visit: Payer: Self-pay

## 2020-03-04 VITALS — Ht 61.0 in | Wt 152.4 lb

## 2020-03-04 DIAGNOSIS — E119 Type 2 diabetes mellitus without complications: Secondary | ICD-10-CM

## 2020-03-04 NOTE — Progress Notes (Signed)
Appt. Start Time: 900 Appt. End Time: 1200  Class 1 Diabetes Overview - define DM; state own type of DM; identify functions of pancreas and insulin; define insulin deficiency vs insulin resistance  Psychosocial - identify DM as a source of stress; state the effects of stress on BG control  Nutritional Management - describe effects of food on blood glucose; identify sources of carbohydrate, protein and fat; verbalize the importance of balance meals in controlling blood glucose  Exercise - describe the effects of exercise on blood glucose and importance of regular exercise in controlling diabetes; state a plan for personal exercise; verbalize contraindications for exercise  Self-Monitoring - state importance of SMBG; use SMBG results to effectively manage diabetes; identify importance of regular HbA1C testing and goals for results  Acute Complications - recognize hyperglycemia and hypoglycemia with causes and effects; identify blood glucose results as high, low or in control; list steps in treating and preventing high and low blood glucose  Chronic Complications/Foot, Skin, Eye Dental Care - identify possible long-term complications of diabetes (retinopathy, neuropathy, nephropathy, cardiovascular disease, infections); state importance of daily self-foot exams; describe how to examine feet and what to look for; explain appropriate eye and dental care  Lifestyle Changes/Goals & Health/Community Resources - state benefits of making appropriate lifestyle changes; identify habits that need to change (meals, tobacco, alcohol); identify strategies to reduce risk factors for personal health  Pregnancy/Sexual Health - define gestational diabetes; state importance of good blood glucose control and birth control prior to pregnancy; state importance of good blood glucose control in preventing sexual problems (impotence, vaginal dryness, infections, loss of desire); state relationship of blood glucose control  and pregnancy outcome; describe risk of maternal and fetal complications  Teaching Materials Used: Class 1 Slides/Notebook Diabetes Booklet ID Card  Medic Alert/Medic ID Forms Sleep Evaluation Exercise Handout Planning a Balanced Meal Goals for Class 1  

## 2020-03-11 ENCOUNTER — Ambulatory Visit: Payer: 59

## 2020-03-18 ENCOUNTER — Encounter: Payer: 59 | Attending: Nurse Practitioner | Admitting: Dietician

## 2020-03-18 ENCOUNTER — Encounter: Payer: Self-pay | Admitting: Dietician

## 2020-03-18 ENCOUNTER — Other Ambulatory Visit: Payer: Self-pay

## 2020-03-18 VITALS — BP 136/84 | Ht 61.0 in | Wt 153.5 lb

## 2020-03-18 DIAGNOSIS — E119 Type 2 diabetes mellitus without complications: Secondary | ICD-10-CM | POA: Diagnosis present

## 2020-03-18 NOTE — Progress Notes (Signed)
Appt. Start Time: 900 Appt. End Time: 1200  Class 3 Diabetes Overview - identify functions of pancreas and insulin; define insulin deficiency vs insulin resistance  Medications - state name, dose, timing of currently prescribed medications; describe types of medications available for diabetes  Psychosocial - identify DM as a source of stress; state the effects of stress on BG control; verbalize appropriate stress management techniques; identify personal stress issues   Nutritional Management - use food labels to identify serving size, content of carbohydrate, fiber, protein, fat, saturated fat and sodium; recognize food sources of fat, saturated fat, trans fat, and sodium, and verbalize goals for intake; describe healthful, appropriate food choices when dining out   Exercise - state a plan for personal exercise; verbalize contraindications for exercise  Self-Monitoring - state importance of SMBG; use SMBG results to effectively manage diabetes; identify importance of regular HbA1C testing and goals for results  Acute Complications - recognize hyperglycemia and hypoglycemia with causes and effects; identify blood glucose results as high, low or in control; list steps in treating and preventing high and low blood glucose  Chronic Complications - state importance of daily self-foot exams; explain appropriate eye and dental care  Lifestyle Changes/Goals & Health/Community Resources - set goals for proper diabetes care; state need for and frequency of healthcare follow-up; describe appropriate community resources for good health (ADA, web sites, apps)   Teaching Materials Used: Class 3 Slide Packet Diabetes Stress Test Stress Management Tools Stress Poem Goal Setting Worksheet Website/App List    

## 2020-03-24 ENCOUNTER — Ambulatory Visit: Payer: 59 | Admitting: Nurse Practitioner

## 2020-04-01 ENCOUNTER — Encounter: Payer: 59 | Attending: Nurse Practitioner | Admitting: *Deleted

## 2020-04-01 ENCOUNTER — Encounter: Payer: Self-pay | Admitting: *Deleted

## 2020-04-01 ENCOUNTER — Other Ambulatory Visit: Payer: Self-pay

## 2020-04-01 VITALS — Wt 154.7 lb

## 2020-04-01 DIAGNOSIS — E669 Obesity, unspecified: Secondary | ICD-10-CM | POA: Diagnosis not present

## 2020-04-01 DIAGNOSIS — E118 Type 2 diabetes mellitus with unspecified complications: Secondary | ICD-10-CM | POA: Insufficient documentation

## 2020-04-01 DIAGNOSIS — E119 Type 2 diabetes mellitus without complications: Secondary | ICD-10-CM

## 2020-04-01 NOTE — Progress Notes (Signed)

## 2020-04-02 ENCOUNTER — Encounter: Payer: Self-pay | Admitting: Nurse Practitioner

## 2020-04-02 ENCOUNTER — Other Ambulatory Visit: Payer: Self-pay

## 2020-04-02 ENCOUNTER — Ambulatory Visit (INDEPENDENT_AMBULATORY_CARE_PROVIDER_SITE_OTHER): Payer: 59 | Admitting: Nurse Practitioner

## 2020-04-02 VITALS — BP 122/64 | HR 63 | Temp 97.9°F | Ht 61.0 in | Wt 156.0 lb

## 2020-04-02 DIAGNOSIS — E1159 Type 2 diabetes mellitus with other circulatory complications: Secondary | ICD-10-CM

## 2020-04-02 DIAGNOSIS — K219 Gastro-esophageal reflux disease without esophagitis: Secondary | ICD-10-CM

## 2020-04-02 DIAGNOSIS — E669 Obesity, unspecified: Secondary | ICD-10-CM | POA: Diagnosis not present

## 2020-04-02 DIAGNOSIS — E785 Hyperlipidemia, unspecified: Secondary | ICD-10-CM

## 2020-04-02 DIAGNOSIS — L989 Disorder of the skin and subcutaneous tissue, unspecified: Secondary | ICD-10-CM | POA: Insufficient documentation

## 2020-04-02 DIAGNOSIS — E1169 Type 2 diabetes mellitus with other specified complication: Secondary | ICD-10-CM | POA: Diagnosis not present

## 2020-04-02 DIAGNOSIS — I10 Essential (primary) hypertension: Secondary | ICD-10-CM

## 2020-04-02 NOTE — Progress Notes (Signed)
BP 122/64 (BP Location: Right Arm, Cuff Size: Normal)   Pulse 63   Temp 97.9 F (36.6 C)   Ht 5' 1"  (1.549 m)   Wt 156 lb (70.8 kg)   LMP 10/14/1992 (Approximate)   SpO2 100%   BMI 29.48 kg/m    Subjective:    Patient ID: Linda Boyle, female    DOB: May 12, 1961, 59 y.o.   MRN: [id number]  HPI: Linda Boyle is a 59 y.o. female presenting for follow up.  Chief Complaint  Patient presents with  . Follow-up   HYPERTENSION / HYPERLIPIDEMIA Satisfied with current treatment? no Duration of hypertension: chronic BP monitoring frequency: weekly BP range: does not remember and forgot log BP medication side effects: no Past BP meds: verapamil 180 mg Duration of hyperlipidemia: chronic Cholesterol medication side effects: no Cholesterol supplements: none Past cholesterol medications:  Atorvastatin 20 mg Medication compliance: excellent compliance Aspirin: no Recent stressors: no Recurrent headaches: no Visual changes: yes; plans to see eye MD soon Palpitations: no Dyspnea: no Chest pain: no Lower extremity edema: no Dizzy/lightheaded: no  DIABETES Currently treating with diet and lifestyle changes and is currently seeing a Nutritionist. Hypoglycemic episodes:no Polydipsia/polyuria: no Visual disturbance: yes; plans to see eye MD  Chest pain: no Paresthesias: no Glucose Monitoring: yes  Accucheck frequency: three times per week  Fasting glucose: 99  Post prandial: 98  At bedtime: 127 Taking Insulin?: no Blood Pressure Monitoring: weekly Retinal Examination: Not up to Date Foot Exam: done today Diabetic Education: Completed Pneumovax: Not up to Date Influenza: Up to Date Aspirin: no  GERD GERD control status: controlledSatisfied with current treatment? yes Heartburn frequency: rarely Medication side effects: no  Medication compliance: excellent Dysphagia: no Odynophagia:  no Hematemesis: no Blood in stool: no EGD: no   SKIN LESION Duration:  months Location: bottom of left foot Painful: no Itching: no Onset: gradual Context: bigger Associated signs and symptoms: none History of skin cancer: no History of precancerous skin lesions: no Family history of skin cancer: no   Allergies  Allergen Reactions  . Lisinopril     cough  . Losartan Cough   Outpatient Encounter Medications as of 04/02/2020  Medication Sig Note  . atorvastatin (LIPITOR) 20 MG tablet Take 1 tablet (20 mg total) by mouth daily.   . blood glucose meter kit and supplies KIT Dispense based on patient and insurance preference. Use up to four times daily as directed. (FOR ICD-9 250.00, 250.01).   . Cholecalciferol (VITAMIN D-3) 1000 UNITS CAPS Take 1,000 Units by mouth daily.   . fexofenadine (ALLEGRA) 180 MG tablet Take 1 tablet (180 mg total) by mouth daily as needed for allergies or rhinitis.   Marland Kitchen loratadine (CLARITIN) 10 MG tablet Take 10 mg by mouth daily.   . meclizine (ANTIVERT) 25 MG tablet Take 1 tablet (25 mg total) by mouth 3 (three) times daily as needed for dizziness. 05/28/2019: As needed  . omeprazole (PRILOSEC) 20 MG capsule Take 1 capsule (20 mg total) by mouth daily.   . verapamil (VERELAN PM) 180 MG 24 hr capsule Take 1 capsule (180 mg total) by mouth at bedtime.   . fluticasone (FLONASE) 50 MCG/ACT nasal spray Place 2 sprays into both nostrils daily. (Patient not taking: Reported on 04/02/2020)    No facility-administered encounter medications on file as of 04/02/2020.   Patient Active Problem List   Diagnosis Date Noted  . Skin lesion of foot 04/02/2020  . Elevated LFTs 09/29/2019  . Microalbuminuria  09/26/2019  . Type 2 diabetes mellitus with obesity ([place]) 09/13/2018  . Obesity (BMI 30-39.9) 09/07/2017  . Rotator cuff disorder 08/08/2017  . Atrophic vaginitis 01/26/2017  . Allergic rhinitis 07/16/2015  . GERD (gastroesophageal reflux disease) 07/16/2015  . Hyperlipidemia associated with type 2 diabetes mellitus ([place]) 07/16/2015  .  Hypertension associated with diabetes ([place]) 07/16/2015   Past Medical History:  Diagnosis Date  . Allergy   . Diabetes mellitus without complication ([place])   . GERD (gastroesophageal reflux disease)   . Hyperlipidemia   . Hypertension   . Mitral valve prolapse   . Neck sprain   . Vertigo    Relevant past medical, surgical, family and social history reviewed and updated as indicated. Interim medical history since our last visit reviewed.  Review of Systems  Constitutional: Negative.  Negative for activity change, appetite change, fatigue and fever.  HENT: Negative.   Eyes: Positive for visual disturbance (increasing blurred vision). Negative for pain, discharge, redness and itching.  Respiratory: Negative.  Negative for chest tightness, shortness of breath and wheezing.   Cardiovascular: Negative.  Negative for chest pain, palpitations and leg swelling.  Gastrointestinal: Negative.  Negative for diarrhea, nausea and vomiting.  Genitourinary: Negative.   Musculoskeletal: Negative.   Skin: Positive for wound (blister to bottom of left foot).  Neurological: Negative.  Negative for dizziness, weakness, light-headedness, numbness and headaches.  Psychiatric/Behavioral: Negative.  Negative for behavioral problems, confusion, decreased concentration and sleep disturbance. The patient is not nervous/anxious.     Per HPI unless specifically indicated above     Objective:    BP 122/64 (BP Location: Right Arm, Cuff Size: Normal)   Pulse 63   Temp 97.9 F (36.6 C)   Ht 5' 1"  (1.549 m)   Wt 156 lb (70.8 kg)   LMP 10/14/1992 (Approximate)   SpO2 100%   BMI 29.48 kg/m   Wt Readings from Last 3 Encounters:  04/02/20 156 lb (70.8 kg)  04/01/20 154 lb 11.2 oz (70.2 kg)  03/18/20 153 lb 8 oz (69.6 kg)    Physical Exam Vitals and nursing note reviewed.  Constitutional:      General: She is not in acute distress.    Appearance: Normal appearance. She is obese. She is not  toxic-appearing.  HENT:     Head: Normocephalic and atraumatic.     Right Ear: External ear normal.     Left Ear: External ear normal.  Eyes:     General: No scleral icterus.    Extraocular Movements: Extraocular movements intact.  Neck:     Vascular: No carotid bruit.  Cardiovascular:     Rate and Rhythm: Normal rate and regular rhythm.     Heart sounds: Normal heart sounds. No murmur heard.   Pulmonary:     Effort: Pulmonary effort is normal. No respiratory distress.     Breath sounds: Normal breath sounds. No wheezing, rhonchi or rales.  Abdominal:     General: Abdomen is flat. Bowel sounds are normal.     Palpations: Abdomen is soft.     Tenderness: There is no abdominal tenderness.  Musculoskeletal:        General: No swelling or tenderness. Normal range of motion.     Cervical back: Normal range of motion.  Skin:    General: Skin is warm and dry.     Capillary Refill: Capillary refill takes less than 2 seconds.     Coloration: Skin is not jaundiced.     Findings: Lesion  present. No bruising.       Neurological:     General: No focal deficit present.     Mental Status: She is alert and oriented to person, place, and time.     Motor: No weakness.     Gait: Gait normal.  Psychiatric:        Mood and Affect: Mood normal.        Behavior: Behavior normal.        Thought Content: Thought content normal.        Judgment: Judgment normal.       Assessment & Plan:   Problem List Items Addressed This Visit      Cardiovascular and Mediastinum   Hypertension associated with diabetes (HCC)    Chronic, stable.  Continue verapamil 180 mg.  BMP checked today.  Encouraged patient to monitor blood pressure at home daily and keep log and bring log to next appointment with PCP.      Relevant Orders   Lipid Panel w/o Chol/HDL Ratio   Basic Metabolic Panel (BMET)     Digestive   GERD (gastroesophageal reflux disease)    Chronic, stable on omeprazole 20 mg.  Continue this  medication for now.        Endocrine   Hyperlipidemia associated with type 2 diabetes mellitus (HCC)    Chronic, stable.  Continue atorvastatin 20 mg daily.  Lipids checked today, patient is fasting.  Will need to increase atorvastatin if LDL above goal of less than 100.      Relevant Orders   Lipid Panel w/o Chol/HDL Ratio   Basic Metabolic Panel (BMET)   Type 2 diabetes mellitus with obesity ([place]) - Primary    Diagnosed with type 2 diabetes in July 2021.  Patient working with nutritionist, continue this collaboration.  Foot exam done today, due for eye exam-patient will contact ophthalmologist.  Unable to take ACE or ARB for blood pressure due to side effects.  Does not desire medication treatment at this time.  We will recheck hemoglobin A1c today.  Continue with blood sugar checks a few times weekly with goal of blood sugar less than 130 in the mornings.  Follow-up in 6 months pending A1c level.      Relevant Orders   HgB G6V   Basic Metabolic Panel (BMET)     Musculoskeletal and Integument   Skin lesion of foot    Chronic, ongoing.  Patient reports lesion for years, thought was a blister.  Unlikely that this is a blister at this point, usually are self resolving. referral discussed to dermatology for further examination, patient in agreement.  Will place dermatology referral today.      Relevant Orders   Ambulatory referral to Dermatology     Other   Obesity (BMI 30-39.9)    Appears to have gained weight since last visit here.  Continue hard work with diet and exercise and collaboration with nutritionist.          Follow up plan: Return in about 6 months (around 09/30/2020) for HTN, HLD, DM.

## 2020-04-02 NOTE — Assessment & Plan Note (Signed)
Chronic, stable.  Continue verapamil 180 mg.  BMP checked today.  Encouraged patient to monitor blood pressure at home daily and keep log and bring log to next appointment with PCP.

## 2020-04-02 NOTE — Assessment & Plan Note (Signed)
Chronic, stable.  Continue atorvastatin 20 mg daily.  Lipids checked today, patient is fasting.  Will need to increase atorvastatin if LDL above goal of less than 100.

## 2020-04-02 NOTE — Assessment & Plan Note (Addendum)
Appears to have gained weight since last visit here.  Continue hard work with diet and exercise and collaboration with nutritionist.

## 2020-04-02 NOTE — Assessment & Plan Note (Signed)
Chronic, stable on omeprazole 20 mg.  Continue this medication for now.

## 2020-04-02 NOTE — Assessment & Plan Note (Signed)
Chronic, ongoing.  Patient reports lesion for years, thought was a blister.  Unlikely that this is a blister at this point, usually are self resolving. referral discussed to dermatology for further examination, patient in agreement.  Will place dermatology referral today.

## 2020-04-02 NOTE — Assessment & Plan Note (Addendum)
Diagnosed with type 2 diabetes in July 2021.  Patient working with nutritionist, continue this collaboration.  Foot exam done today, due for eye exam-patient will contact ophthalmologist.  Unable to take ACE or ARB for blood pressure due to side effects.  Does not desire medication treatment at this time.  We will recheck hemoglobin A1c today.  Continue with blood sugar checks a few times weekly with goal of blood sugar less than 130 in the mornings.  Follow-up in 6 months pending A1c level.

## 2020-04-02 NOTE — Patient Instructions (Signed)
Carbohydrate Counting for Diabetes Mellitus, Adult  Carbohydrate counting is a method of keeping track of how many carbohydrates you eat. Eating carbohydrates naturally increases the amount of sugar (glucose) in the blood. Counting how many carbohydrates you eat helps keep your blood glucose within normal limits, which helps you manage your diabetes (diabetes mellitus). It is important to know how many carbohydrates you can safely have in each meal. This is different for every person. A diet and nutrition specialist (registered dietitian) can help you make a meal plan and calculate how many carbohydrates you should have at each meal and snack. Carbohydrates are found in the following foods:  Grains, such as breads and cereals.  Dried beans and soy products.  Starchy vegetables, such as potatoes, peas, and corn.  Fruit and fruit juices.  Milk and yogurt.  Sweets and snack foods, such as cake, cookies, candy, chips, and soft drinks. How do I count carbohydrates? There are two ways to count carbohydrates in food. You can use either of the methods or a combination of both. Reading "Nutrition Facts" on packaged food The "Nutrition Facts" list is included on the labels of almost all packaged foods and beverages in the [place] It includes:  The serving size.  Information about nutrients in each serving, including the grams (g) of carbohydrate per serving. To use the "Nutrition Facts":  Decide how many servings you will have.  Multiply the number of servings by the number of carbohydrates per serving.  The resulting number is the total amount of carbohydrates that you will be having. Learning standard serving sizes of other foods When you eat carbohydrate foods that are not packaged or do not include "Nutrition Facts" on the label, you need to measure the servings in order to count the amount of carbohydrates:  Measure the foods that you will eat with a food scale or measuring cup, if  needed.  Decide how many standard-size servings you will eat.  Multiply the number of servings by 15. Most carbohydrate-rich foods have about 15 g of carbohydrates per serving. ? For example, if you eat 8 oz (170 g) of strawberries, you will have eaten 2 servings and 30 g of carbohydrates (2 servings x 15 g = 30 g).  For foods that have more than one food mixed, such as soups and casseroles, you must count the carbohydrates in each food that is included. The following list contains standard serving sizes of common carbohydrate-rich foods. Each of these servings has about 15 g of carbohydrates:   hamburger bun or  English muffin.   oz (15 mL) syrup.   oz (14 g) jelly.  1 slice of bread.  1 six-inch tortilla.  3 oz (85 g) cooked rice or pasta.  4 oz (113 g) cooked dried beans.  4 oz (113 g) starchy vegetable, such as peas, corn, or potatoes.  4 oz (113 g) hot cereal.  4 oz (113 g) mashed potatoes or  of a large baked potato.  4 oz (113 g) canned or frozen fruit.  4 oz (120 mL) fruit juice.  4-6 crackers.  6 chicken nuggets.  6 oz (170 g) unsweetened dry cereal.  6 oz (170 g) plain fat-free yogurt or yogurt sweetened with artificial sweeteners.  8 oz (240 mL) milk.  8 oz (170 g) fresh fruit or one small piece of fruit.  24 oz (680 g) popped popcorn. Example of carbohydrate counting Sample meal  3 oz (85 g) chicken breast.  6 oz (170 g)   brown rice.  4 oz (113 g) corn.  8 oz (240 mL) milk.  8 oz (170 g) strawberries with sugar-free whipped topping. Carbohydrate calculation 1. Identify the foods that contain carbohydrates: ? Rice. ? Corn. ? Milk. ? Strawberries. 2. Calculate how many servings you have of each food: ? 2 servings rice. ? 1 serving corn. ? 1 serving milk. ? 1 serving strawberries. 3. Multiply each number of servings by 15 g: ? 2 servings rice x 15 g = 30 g. ? 1 serving corn x 15 g = 15 g. ? 1 serving milk x 15 g = 15 g. ? 1  serving strawberries x 15 g = 15 g. 4. Add together all of the amounts to find the total grams of carbohydrates eaten: ? 30 g + 15 g + 15 g + 15 g = 75 g of carbohydrates total. Summary  Carbohydrate counting is a method of keeping track of how many carbohydrates you eat.  Eating carbohydrates naturally increases the amount of sugar (glucose) in the blood.  Counting how many carbohydrates you eat helps keep your blood glucose within normal limits, which helps you manage your diabetes.  A diet and nutrition specialist (registered dietitian) can help you make a meal plan and calculate how many carbohydrates you should have at each meal and snack. This information is not intended to replace advice given to you by your health care provider. Make sure you discuss any questions you have with your health care provider. Document Revised: 01/18/2017 Document Reviewed: 12/08/2015 Elsevier Patient Education  2020 Elsevier Inc.  

## 2020-04-03 LAB — BASIC METABOLIC PANEL
BUN/Creatinine Ratio: 18 (ref 12–28)
BUN: 13 mg/dL (ref 8–27)
CO2: 26 mmol/L (ref 20–29)
Calcium: 9.5 mg/dL (ref 8.7–10.3)
Chloride: 103 mmol/L (ref 96–106)
Creatinine, Ser: 0.72 mg/dL (ref 0.57–1.00)
GFR calc Af Amer: 105 mL/min/{1.73_m2} (ref >59)
GFR calc non Af Amer: 91 mL/min/{1.73_m2} (ref >59)
Glucose: 91 mg/dL (ref 65–99)
Potassium: 3.8 mmol/L (ref 3.5–5.2)
Sodium: 141 mmol/L (ref 134–144)

## 2020-04-03 LAB — HEMOGLOBIN A1C
Est. average glucose Bld gHb Est-mCnc: 131 mg/dL
Hgb A1c MFr Bld: 6.2 % — ABNORMAL HIGH (ref 4.8–5.6)

## 2020-04-03 LAB — LIPID PANEL W/O CHOL/HDL RATIO
Cholesterol, Total: 187 mg/dL (ref 100–199)
HDL: 47 mg/dL (ref >39)
LDL Chol Calc (NIH): 119 mg/dL — ABNORMAL HIGH (ref 0–99)
Triglycerides: 114 mg/dL (ref 0–149)
VLDL Cholesterol Cal: 21 mg/dL (ref 5–40)

## 2020-04-05 ENCOUNTER — Encounter: Payer: Self-pay | Admitting: *Deleted

## 2020-08-24 ENCOUNTER — Ambulatory Visit: Payer: 59 | Admitting: Dermatology

## 2020-09-30 ENCOUNTER — Ambulatory Visit: Payer: 59 | Admitting: Nurse Practitioner

## 2020-09-30 ENCOUNTER — Other Ambulatory Visit: Payer: Self-pay

## 2020-09-30 ENCOUNTER — Encounter: Payer: Self-pay | Admitting: Nurse Practitioner

## 2020-09-30 VITALS — BP 136/78 | HR 77 | Temp 97.6°F | Wt 154.2 lb

## 2020-09-30 DIAGNOSIS — E1169 Type 2 diabetes mellitus with other specified complication: Secondary | ICD-10-CM

## 2020-09-30 DIAGNOSIS — I152 Hypertension secondary to endocrine disorders: Secondary | ICD-10-CM

## 2020-09-30 DIAGNOSIS — R809 Proteinuria, unspecified: Secondary | ICD-10-CM

## 2020-09-30 DIAGNOSIS — R7989 Other specified abnormal findings of blood chemistry: Secondary | ICD-10-CM | POA: Diagnosis not present

## 2020-09-30 DIAGNOSIS — E1159 Type 2 diabetes mellitus with other circulatory complications: Secondary | ICD-10-CM | POA: Diagnosis not present

## 2020-09-30 DIAGNOSIS — E785 Hyperlipidemia, unspecified: Secondary | ICD-10-CM

## 2020-09-30 DIAGNOSIS — Z23 Encounter for immunization: Secondary | ICD-10-CM

## 2020-09-30 DIAGNOSIS — E669 Obesity, unspecified: Secondary | ICD-10-CM | POA: Diagnosis not present

## 2020-09-30 LAB — BAYER DCA HB A1C WAIVED: HB A1C (BAYER DCA - WAIVED): 5.9 % (ref <7.0)

## 2020-09-30 LAB — MICROALBUMIN, URINE WAIVED
Creatinine, Urine Waived: 300 mg/dL (ref 10–300)
Microalb, Ur Waived: 150 mg/L — ABNORMAL HIGH (ref 0–19)

## 2020-09-30 NOTE — Assessment & Plan Note (Signed)
Recommended eating smaller high protein, low fat meals more frequently and exercising 30 mins a day 5 times a week with a goal of 10-15lb weight loss in the next 3 months. Patient voiced their understanding and motivation to adhere to these recommendations.  

## 2020-09-30 NOTE — Assessment & Plan Note (Signed)
Chronic, ongoing.  Check lipid panel.  Consider increasing Atorvastatin if LDL above goal.  Is fasting today.

## 2020-09-30 NOTE — Assessment & Plan Note (Signed)
Ongoing stable with diet only control.  A1c today 5.9%, below goal, and urine ALB 150, would benefit ACE or ARB, but is allergic to both.  Recommend she check BS every morning with goal <130.  Plan to recheck A1c in 6 months at physical.

## 2020-09-30 NOTE — Assessment & Plan Note (Signed)
Chronic, ongoing with BP at goal today.  Does not tolerate ACE or ARB -- cough and HCTZ drops K+.  Recommend she continue to check BP at least 3 days at week at home and document.  Continue current medication regimen with Verapamil.  Return in 6 months for physical.

## 2020-09-30 NOTE — Patient Instructions (Signed)
Pneumococcal Polysaccharide Vaccine (PPSV23): What You Need to Know 1. Why get vaccinated? Pneumococcal polysaccharide vaccine (PPSV23) can prevent pneumococcal disease. Pneumococcal disease refers to any illness caused by pneumococcal bacteria. These bacteria can cause many types of illnesses, including pneumonia, which is an infection of the lungs. Pneumococcal bacteria are one of the most common causes of pneumonia. Besides pneumonia, pneumococcal bacteria can also cause:  Ear infections  Sinus infections  Meningitis (infection of the tissue covering the brain and spinal cord)  Bacteremia (bloodstream infection) Anyone can get pneumococcal disease, but children under 2 years of age, people with certain medical conditions, adults 65 years or older, and cigarette smokers are at the highest risk. Most pneumococcal infections are mild. However, some can result in long-term problems, such as brain damage or hearing loss. Meningitis, bacteremia, and pneumonia caused by pneumococcal disease can be fatal. 2. PPSV23 PPSV23 protects against 23 types of bacteria that cause pneumococcal disease. PPSV23 is recommended for:  All adults 65 years or older,  Anyone 2 years or older with certain medical conditions that can lead to an increased risk for pneumococcal disease. Most people need only one dose of PPSV23. A second dose of PPSV23, and another type of pneumococcal vaccine called PCV13, are recommended for certain high-risk groups. Your health care provider can give you more information. People 65 years or older should get a dose of PPSV23 even if they have already gotten one or more doses of the vaccine before they turned 65. 3. Talk with your health care provider Tell your vaccine provider if the person getting the vaccine:  Has had an allergic reaction after a previous dose of PPSV23, or has any severe, life-threatening allergies. In some cases, your health care provider may decide to postpone  PPSV23 vaccination to a future visit. People with minor illnesses, such as a cold, may be vaccinated. People who are moderately or severely ill should usually wait until they recover before getting PPSV23. Your health care provider can give you more information. 4. Risks of a vaccine reaction  Redness or pain where the shot is given, feeling tired, fever, or muscle aches can happen after PPSV23. People sometimes faint after medical procedures, including vaccination. Tell your provider if you feel dizzy or have vision changes or ringing in the ears. As with any medicine, there is a very remote chance of a vaccine causing a severe allergic reaction, other serious injury, or death. 5. What if there is a serious problem? An allergic reaction could occur after the vaccinated person leaves the clinic. If you see signs of a severe allergic reaction (hives, swelling of the face and throat, difficulty breathing, a fast heartbeat, dizziness, or weakness), call 9-1-1 and get the person to the nearest hospital. For other signs that concern you, call your health care provider. Adverse reactions should be reported to the Vaccine Adverse Event Reporting System (VAERS). Your health care provider will usually file this report, or you can do it yourself. Visit the VAERS website at www.vaers.hhs.gov or call 1-800-822-7967. VAERS is only for reporting reactions, and VAERS staff do not give medical advice. 6. How can I learn more?  Ask your health care provider.  Call your local or state health department.  Contact the Centers for Disease Control and Prevention (CDC): ? Call 1-800-232-4636 (1-800-CDC-INFO) or ? Visit CDC's website at www.cdc.gov/vaccines Vaccine Information Statement PPSV23 Vaccine (05/08/2018) This information is not intended to replace advice given to you by your health care provider. Make sure you discuss   any questions you have with your health care provider. Document Revised: 02/27/2020  Document Reviewed: 02/27/2020 Elsevier Patient Education  2021 Elsevier Inc.   

## 2020-09-30 NOTE — Assessment & Plan Note (Signed)
Check urine micro and kidney function today, adjust regimen as needed. Urine ALB 150 and A:C 30-300.  Has no current ACE or ARB due to past reactions, monitor closely.

## 2020-09-30 NOTE — Progress Notes (Signed)
BP 136/78   Pulse 77   Temp 97.6 F (36.4 C) (Oral)   Wt 154 lb 3.2 oz (69.9 kg)   LMP 10/14/1992 (Approximate)   SpO2 98%   BMI 29.14 kg/m    Subjective:    Patient ID: Linda Boyle, female    DOB: 1961/04/15, 60 y.o.   MRN: [id number]  HPI: Linda Boyle is a 60 y.o. female presenting for follow up.  Chief Complaint  Patient presents with  . Diabetes  . Hyperlipidemia  . Hypertension   HYPERTENSION / HYPERLIPIDEMIA Continues on Verapamil 180 MG daily.  In past has ARB/ACE cough history. Satisfied with current treatment? no Duration of hypertension: chronic BP monitoring frequency: weekly BP range: does not remember and forgot log BP medication side effects: no Past BP meds: verapamil 180 mg Duration of hyperlipidemia: chronic Cholesterol medication side effects: no Cholesterol supplements: none Past cholesterol medications:  Atorvastatin 20 MG Medication compliance: excellent compliance Aspirin: no Recent stressors: no Recurrent headaches: no Visual changes: none Palpitations: no Dyspnea: no Chest pain: no Lower extremity edema: no Dizzy/lightheaded: no  DIABETES Currently treating with diet and lifestyle changes and is currently seeing a Nutritionist. Last A1c 6.2%. Hypoglycemic episodes:no Polydipsia/polyuria: no Visual disturbance: none Chest pain: no Paresthesias: no Glucose Monitoring: yes  Accucheck frequency: occasional  Fasting glucose: 122 three weeks ago  Post prandial:   At bedtime:  Taking Insulin?: no Blood Pressure Monitoring: weekly Retinal Examination: Not up to Date Foot Exam: Up To Date Diabetic Education: Completed Pneumovax: Up To Date Influenza: Up to Date Aspirin: no  Allergies  Allergen Reactions  . Lisinopril     cough  . Losartan Cough   Outpatient Encounter Medications as of 09/30/2020  Medication Sig Note  . atorvastatin (LIPITOR) 20 MG tablet Take 1 tablet (20 mg total) by mouth daily.   . blood  glucose meter kit and supplies KIT Dispense based on patient and insurance preference. Use up to four times daily as directed. (FOR ICD-9 250.00, 250.01).   . Cholecalciferol (VITAMIN D-3) 1000 UNITS CAPS Take 1,000 Units by mouth daily.   . fluticasone (FLONASE) 50 MCG/ACT nasal spray Place 2 sprays into both nostrils daily.   Marland Kitchen loratadine (CLARITIN) 10 MG tablet Take 10 mg by mouth daily.   . meclizine (ANTIVERT) 25 MG tablet Take 1 tablet (25 mg total) by mouth 3 (three) times daily as needed for dizziness. 05/28/2019: As needed  . omeprazole (PRILOSEC) 20 MG capsule Take 1 capsule (20 mg total) by mouth daily.   . verapamil (VERELAN PM) 180 MG 24 hr capsule Take 1 capsule (180 mg total) by mouth at bedtime.   . [DISCONTINUED] fexofenadine (ALLEGRA) 180 MG tablet Take 1 tablet (180 mg total) by mouth daily as needed for allergies or rhinitis.    No facility-administered encounter medications on file as of 09/30/2020.   Patient Active Problem List   Diagnosis Date Noted  . Elevated LFTs 09/29/2019  . Microalbuminuria 09/26/2019  . Type 2 diabetes mellitus with obesity ([place]) 09/13/2018  . Obesity (BMI 30-39.9) 09/07/2017  . Atrophic vaginitis 01/26/2017  . Allergic rhinitis 07/16/2015  . GERD (gastroesophageal reflux disease) 07/16/2015  . Hyperlipidemia associated with type 2 diabetes mellitus ([place]) 07/16/2015  . Hypertension associated with diabetes ([place]) 07/16/2015   Past Medical History:  Diagnosis Date  . Allergy   . Diabetes mellitus without complication ([place])   . GERD (gastroesophageal reflux disease)   . Hyperlipidemia   . Hypertension   .  Mitral valve prolapse   . Neck sprain   . Vertigo    Relevant past medical, surgical, family and social history reviewed and updated as indicated. Interim medical history since our last visit reviewed.  Review of Systems  Constitutional: Negative.  Negative for activity change, appetite change, fatigue and fever.  HENT: Negative.    Respiratory: Negative.   Cardiovascular: Negative.   Gastrointestinal: Negative.   Genitourinary: Negative.   Neurological: Negative.   Psychiatric/Behavioral: Negative.     Per HPI unless specifically indicated above     Objective:    BP 136/78   Pulse 77   Temp 97.6 F (36.4 C) (Oral)   Wt 154 lb 3.2 oz (69.9 kg)   LMP 10/14/1992 (Approximate)   SpO2 98%   BMI 29.14 kg/m   Wt Readings from Last 3 Encounters:  09/30/20 154 lb 3.2 oz (69.9 kg)  04/02/20 156 lb (70.8 kg)  04/01/20 154 lb 11.2 oz (70.2 kg)    Physical Exam Vitals and nursing note reviewed.  Constitutional:      General: She is not in acute distress.    Appearance: Normal appearance. She is obese. She is not toxic-appearing.  HENT:     Head: Normocephalic and atraumatic.     Right Ear: External ear normal.     Left Ear: External ear normal.  Eyes:     General: No scleral icterus.    Extraocular Movements: Extraocular movements intact.  Neck:     Vascular: No carotid bruit.  Cardiovascular:     Rate and Rhythm: Normal rate and regular rhythm.     Heart sounds: Normal heart sounds. No murmur heard.   Pulmonary:     Effort: Pulmonary effort is normal. No respiratory distress.     Breath sounds: Normal breath sounds. No wheezing, rhonchi or rales.  Abdominal:     General: Abdomen is flat. Bowel sounds are normal.     Palpations: Abdomen is soft.     Tenderness: There is no abdominal tenderness.  Musculoskeletal:        General: No swelling or tenderness. Normal range of motion.     Cervical back: Normal range of motion.  Skin:    General: Skin is warm and dry.     Capillary Refill: Capillary refill takes less than 2 seconds.  Neurological:     General: No focal deficit present.     Mental Status: She is alert and oriented to person, place, and time.     Motor: No weakness.     Gait: Gait normal.  Psychiatric:        Mood and Affect: Mood normal.        Behavior: Behavior normal.         Thought Content: Thought content normal.        Judgment: Judgment normal.       Assessment & Plan:   Problem List Items Addressed This Visit      Cardiovascular and Mediastinum   Hypertension associated with diabetes ([place])    Chronic, ongoing with BP at goal today.  Does not tolerate ACE or ARB -- cough and HCTZ drops K+.  Recommend she continue to check BP at least 3 days at week at home and document.  Continue current medication regimen with Verapamil.  Return in 6 months for physical.      Relevant Orders   Comprehensive metabolic panel   Bayer DCA Hb A1c Waived   Microalbumin, Urine Waived  Endocrine   Hyperlipidemia associated with type 2 diabetes mellitus (HCC)    Chronic, ongoing.  Check lipid panel.  Consider increasing Atorvastatin if LDL above goal.  Is fasting today.      Relevant Orders   Comprehensive metabolic panel   Lipid Panel w/o Chol/HDL Ratio   Bayer DCA Hb A1c Waived   Type 2 diabetes mellitus with obesity ([place]) - Primary    Ongoing stable with diet only control.  A1c today 5.9%, below goal, and urine ALB 150, would benefit ACE or ARB, but is allergic to both.  Recommend she check BS every morning with goal <130.  Plan to recheck A1c in 6 months at physical.      Relevant Orders   Bayer DCA Hb A1c Waived   Pneumococcal polysaccharide vaccine 23-valent greater than or equal to 2yo subcutaneous/IM     Other   Obesity (BMI 30-39.9)    Recommended eating smaller high protein, low fat meals more frequently and exercising 30 mins a day 5 times a week with a goal of 10-15lb weight loss in the next 3 months. Patient voiced their understanding and motivation to adhere to these recommendations.       Microalbuminuria    Check urine micro and kidney function today, adjust regimen as needed. Urine ALB 150 and A:C 30-300.  Has no current ACE or ARB due to past reactions, monitor closely.      Elevated LFTs    CMP today, consider ultrasound if worsening.           Follow up plan: Return in about 6 months (around 04/02/2021) for Annual exam.

## 2020-09-30 NOTE — Assessment & Plan Note (Signed)
CMP today, consider ultrasound if worsening.

## 2020-10-01 ENCOUNTER — Other Ambulatory Visit: Payer: Self-pay | Admitting: Nurse Practitioner

## 2020-10-01 DIAGNOSIS — E876 Hypokalemia: Secondary | ICD-10-CM

## 2020-10-01 DIAGNOSIS — R7989 Other specified abnormal findings of blood chemistry: Secondary | ICD-10-CM

## 2020-10-01 LAB — COMPREHENSIVE METABOLIC PANEL
ALT: 71 IU/L — ABNORMAL HIGH (ref 0–32)
AST: 53 IU/L — ABNORMAL HIGH (ref 0–40)
Albumin/Globulin Ratio: 1.4 (ref 1.2–2.2)
Albumin: 4.1 g/dL (ref 3.8–4.9)
Alkaline Phosphatase: 78 IU/L (ref 44–121)
BUN/Creatinine Ratio: 15 (ref 12–28)
BUN: 13 mg/dL (ref 8–27)
Bilirubin Total: 0.7 mg/dL (ref 0.0–1.2)
CO2: 26 mmol/L (ref 20–29)
Calcium: 9.1 mg/dL (ref 8.7–10.3)
Chloride: 102 mmol/L (ref 96–106)
Creatinine, Ser: 0.89 mg/dL (ref 0.57–1.00)
Globulin, Total: 2.9 g/dL (ref 1.5–4.5)
Glucose: 102 mg/dL — ABNORMAL HIGH (ref 65–99)
Potassium: 3.4 mmol/L — ABNORMAL LOW (ref 3.5–5.2)
Sodium: 142 mmol/L (ref 134–144)
Total Protein: 7 g/dL (ref 6.0–8.5)
eGFR: 74 mL/min/{1.73_m2} (ref >59)

## 2020-10-01 LAB — LIPID PANEL W/O CHOL/HDL RATIO
Cholesterol, Total: 177 mg/dL (ref 100–199)
HDL: 41 mg/dL (ref >39)
LDL Chol Calc (NIH): 119 mg/dL — ABNORMAL HIGH (ref 0–99)
Triglycerides: 90 mg/dL (ref 0–149)
VLDL Cholesterol Cal: 17 mg/dL (ref 5–40)

## 2020-10-01 NOTE — Progress Notes (Signed)
Contacted via [place] afternoon Linda Boyle, your labs have returned.  Potassium was a little low, I recommend increasing potassium in your diet via bananas, potatoes, dried fruit, mangoes -- of course watch sugar with these too.  I would like to recheck this in 4 weeks via an outpatient lab visit to ensure it is not staying low.  Liver function tests, ALT and AST, have also crept up a little.  I would avoid Tylenol and alcohol use, if used.  Will recheck these on labs in 4 weeks too and monitor closely.  LDL is still above goal at 119, are you taking Atorvastatin daily?  If so we may need to increase dose next visit if ongoing elevations.  Any questions?  Please schedule 4 week lab visit. Keep being awesome!!  Thank you for allowing me to participate in your care. Kindest regards, Audon Heymann

## 2020-10-04 DIAGNOSIS — R7989 Other specified abnormal findings of blood chemistry: Secondary | ICD-10-CM

## 2020-11-29 ENCOUNTER — Other Ambulatory Visit: Payer: Self-pay

## 2020-11-29 ENCOUNTER — Ambulatory Visit (INDEPENDENT_AMBULATORY_CARE_PROVIDER_SITE_OTHER): Payer: 59 | Admitting: Dermatology

## 2020-11-29 ENCOUNTER — Ambulatory Visit: Payer: 59 | Admitting: Dermatology

## 2020-11-29 DIAGNOSIS — D229 Melanocytic nevi, unspecified: Secondary | ICD-10-CM

## 2020-11-29 DIAGNOSIS — D2272 Melanocytic nevi of left lower limb, including hip: Secondary | ICD-10-CM | POA: Diagnosis not present

## 2020-11-29 NOTE — Progress Notes (Signed)
   New Patient Visit  Subjective  Linda Boyle is a 60 y.o. female who presents for the following: Skin Problem (New patient here today for a spot at left foot. She advises the spot has been there for years, is soft and does not hurt. It was like a blister previously that burst, per patient. ).  The following portions of the chart were reviewed this encounter and updated as appropriate:   Tobacco  Allergies  Meds  Problems  Med Hx  Surg Hx  Fam Hx     Review of Systems:  No other skin or systemic complaints except as noted in HPI or Assessment and Plan.  Objective  Well appearing patient in no apparent distress; mood and affect are within normal limits.  A focused examination was performed including left foot. Relevant physical exam findings are noted in the Assessment and Plan.  Objective  Left Medial Mid Sole Arch: 1.0 x 1.6cm flesh papule  Images     Assessment & Plan  Nevus Left Medial Mid Sole Arch No hx of change. Patient will RTC if she notices any change. Benign-appearing.  Observation.  Call clinic for new or changing lesions.  Recommend daily use of broad spectrum spf 30+ sunscreen to sun-exposed areas.   Return if symptoms worsen or fail to improve.  Graciella Belton, RMA, am acting as scribe for Sarina Ser, MD . Documentation: I have reviewed the above documentation for accuracy and completeness, and I agree with the above.  Sarina Ser, MD

## 2020-11-29 NOTE — Patient Instructions (Signed)

## 2020-12-04 ENCOUNTER — Encounter: Payer: Self-pay | Admitting: Dermatology

## 2020-12-05 ENCOUNTER — Other Ambulatory Visit: Payer: Self-pay | Admitting: Nurse Practitioner

## 2020-12-05 NOTE — Telephone Encounter (Signed)
Requested Prescriptions  Pending Prescriptions Disp Refills  . verapamil (VERELAN PM) 180 MG 24 hr capsule [Pharmacy Med Name: Verapamil HCl ER 180 MG Oral Capsule Extended Release 24 Hour] 90 capsule 0    Sig: Take 1 capsule by mouth at bedtime     Cardiovascular:  Calcium Channel Blockers Passed - 12/05/2020  1:18 PM      Passed - Last BP in normal range    BP Readings from Last 1 Encounters:  09/30/20 136/78         Passed - Valid encounter within last 6 months    Recent Outpatient Visits          2 months ago Type 2 diabetes mellitus with obesity ([place])   [place], [place] T, NP   8 months ago Type 2 diabetes mellitus with obesity ([place])   [place], Jessica A, NP   10 months ago Benign hypertensive renal disease   [place], Barbaraann Faster, NP   1 year ago Encounter for annual physical exam   [place], Henrine Screws T, NP   1 year ago Benign hypertensive renal disease   [place] Venita Lick, NP      Future Appointments            In 2 months Ralene Bathe, MD [place]   In 4 months [place], Barbaraann Faster, NP [facility]           . atorvastatin (LIPITOR) 20 MG tablet [Pharmacy Med Name: Atorvastatin Calcium 20 MG Oral Tablet] 90 tablet 1    Sig: Take 1 tablet by mouth once daily     Cardiovascular:  Antilipid - Statins Failed - 12/05/2020  1:18 PM      Failed - LDL in normal range and within 360 days    LDL Chol Calc (NIH)  Date Value Ref Range Status  09/30/2020 119 (H) 0 - 99 mg/dL Final         Passed - Total Cholesterol in normal range and within 360 days    Cholesterol, Total  Date Value Ref Range Status  09/30/2020 177 100 - 199 mg/dL Final         Passed - HDL in normal range and within 360 days    HDL  Date Value Ref Range Status  09/30/2020 41 >39 mg/dL Final         Passed - Triglycerides in normal range and within  360 days    Triglycerides  Date Value Ref Range Status  09/30/2020 90 0 - 149 mg/dL Final         Passed - Patient is not pregnant      Passed - Valid encounter within last 12 months    Recent Outpatient Visits          2 months ago Type 2 diabetes mellitus with obesity ([place])   [place], [place] T, NP   8 months ago Type 2 diabetes mellitus with obesity ([place])   [place], Jessica A, NP   10 months ago Benign hypertensive renal disease   [place], Barbaraann Faster, NP   1 year ago Encounter for annual physical exam   [place], Henrine Screws T, NP   1 year ago Benign hypertensive renal disease   [place], Jolene T, NP      Future Appointments  In 2 months Ralene Bathe, MD [place]   In 4 months [place], Barbaraann Faster, NP [facility]

## 2020-12-09 ENCOUNTER — Other Ambulatory Visit: Payer: Self-pay | Admitting: Nurse Practitioner

## 2020-12-09 DIAGNOSIS — Z1231 Encounter for screening mammogram for malignant neoplasm of breast: Secondary | ICD-10-CM

## 2020-12-13 ENCOUNTER — Other Ambulatory Visit: Payer: Self-pay

## 2020-12-13 ENCOUNTER — Ambulatory Visit
Admission: RE | Admit: 2020-12-13 | Discharge: 2020-12-13 | Disposition: A | Discharge status: Home / Self Care | Payer: 59 | Source: Ambulatory Visit | Attending: Nurse Practitioner | Admitting: Nurse Practitioner

## 2020-12-13 DIAGNOSIS — Z1231 Encounter for screening mammogram for malignant neoplasm of breast: Secondary | ICD-10-CM | POA: Diagnosis present

## 2021-01-16 ENCOUNTER — Other Ambulatory Visit: Payer: Self-pay | Admitting: Nurse Practitioner

## 2021-01-16 NOTE — Telephone Encounter (Signed)
Requested Prescriptions  Pending Prescriptions Disp Refills  . omeprazole (PRILOSEC) 20 MG capsule [Pharmacy Med Name: Omeprazole 20 MG Oral Capsule Delayed Release] 90 capsule 0    Sig: Take 1 capsule by mouth once daily     Gastroenterology: Proton Pump Inhibitors Passed - 01/16/2021 10:09 AM      Passed - Valid encounter within last 12 months    Recent Outpatient Visits          3 months ago Type 2 diabetes mellitus with obesity ([place])   [place], [place] T, NP   9 months ago Type 2 diabetes mellitus with obesity ([place])   [place] Eulogio Bear, NP   12 months ago Benign hypertensive renal disease   [facility] [place], Barbaraann Faster, NP   1 year ago Encounter for annual physical exam   [place], Henrine Screws T, NP   1 year ago Benign hypertensive renal disease   [place], Jolene T, NP      Future Appointments            In 1 month Ralene Bathe, MD [place]   In 2 months [place], Barbaraann Faster, NP [facility], [facility]

## 2021-03-01 ENCOUNTER — Encounter: Payer: Self-pay | Admitting: Dermatology

## 2021-03-01 ENCOUNTER — Other Ambulatory Visit: Payer: Self-pay

## 2021-03-01 ENCOUNTER — Ambulatory Visit (INDEPENDENT_AMBULATORY_CARE_PROVIDER_SITE_OTHER): Payer: 59 | Admitting: Dermatology

## 2021-03-01 DIAGNOSIS — L821 Other seborrheic keratosis: Secondary | ICD-10-CM | POA: Diagnosis not present

## 2021-03-01 DIAGNOSIS — L820 Inflamed seborrheic keratosis: Secondary | ICD-10-CM

## 2021-03-01 NOTE — Progress Notes (Signed)
   Follow-Up Visit   Subjective  Linda Boyle is a 60 y.o. female who presents for the following: Other (Moles of face, neck, axilla that are sticking out and she would like them removed.).  The following portions of the chart were reviewed this encounter and updated as appropriate:   Tobacco  Allergies  Meds  Problems  Med Hx  Surg Hx  Fam Hx     Review of Systems:  No other skin or systemic complaints except as noted in HPI or Assessment and Plan.  Objective  Well appearing patient in no apparent distress; mood and affect are within normal limits.  A focused examination was performed including face, neck. Relevant physical exam findings are noted in the Assessment and Plan.  Face Erythematous keratotic or waxy stuck-on papule or plaque.        Face, neck Stuck-on, waxy, tan-brown papule or plaque --Discussed benign etiology and prognosis.    Assessment & Plan  Inflamed seborrheic keratosis Face  Snip excision to right cheek x 1, left cheek x 1 ED performed to bilateral cheeks x 5  Destruction of lesion - Face Complexity: simple   Destruction method comment:  Electrodesiccation Informed consent: discussed and consent obtained   Timeout:  patient name, date of birth, surgical site, and procedure verified Patient was prepped and draped in usual sterile fashion: patient was prepped with isopropyl alcohol. Outcome: patient tolerated procedure well with no complications    Epidermal / dermal shaving - Face  Informed consent: discussed and consent obtained   Patient was prepped and draped in usual sterile fashion: Area prepped with alcohol. Anesthesia: the lesion was anesthetized in a standard fashion   Local anesthetic: Bupivicaine 0.5% Instrument used: scissors   Outcome: patient tolerated procedure well   Post-procedure details: wound care instructions given    Seborrheic keratosis Face, neck  Benign.  Return for 2-3 months , Follow up.  I, Ashok Cordia, CMA, am acting as scribe for Sarina Ser, MD . Documentation: I have reviewed the above documentation for accuracy and completeness, and I agree with the above.  Sarina Ser, MD

## 2021-03-01 NOTE — Patient Instructions (Signed)
Wound Care Instructions  Cleanse wound gently with soap and water once a day then pat dry with clean gauze. Apply a thing coat of Petrolatum (petroleum jelly, "Vaseline") over the wound (unless you have an allergy to this). We recommend that you use a new, sterile tube of Vaseline. Do not pick or remove scabs. Do not remove the yellow or white "healing tissue" from the base of the wound.  Cover the wound with fresh, clean, nonstick gauze and secure with paper tape. You may use Band-Aids in place of gauze and tape if the would is small enough, but would recommend trimming much of the tape off as there is often too much. Sometimes Band-Aids can irritate the skin.  You should call the office for your biopsy report after 1 week if you have not already been contacted.  If you experience any problems, such as abnormal amounts of bleeding, swelling, significant bruising, significant pain, or evidence of infection, please call the office immediately.  FOR ADULT SURGERY PATIENTS: If you need something for pain relief you may take 1 extra strength Tylenol (acetaminophen) AND 2 Ibuprofen (200mg each) together every 4 hours as needed for pain. (do not take these if you are allergic to them or if you have a reason you should not take them.) Typically, you may only need pain medication for 1 to 3 days.   If you have any questions or concerns for your doctor, please call our main line at [phone] and press option 4 to reach your doctor's medical assistant. If no one answers, please leave a voicemail as directed and we will return your call as soon as possible. Messages left after 4 pm will be answered the following business day.   You may also send us a message via MyChart. We typically respond to MyChart messages within 1-2 business days.  For prescription refills, please ask your pharmacy to contact our office. Our fax number is [phone].  If you have an urgent issue when the clinic is closed that  cannot wait until the next business day, you can page your doctor at the number below.    Please note that while we do our best to be available for urgent issues outside of office hours, we are not available 24/7.   If you have an urgent issue and are unable to reach us, you may choose to seek medical care at your doctor's office, retail clinic, urgent care center, or emergency room.  If you have a medical emergency, please immediately call 911 or go to the emergency department.  Pager Numbers  - Dr. Kowalski: [phone]  - Dr. Moye: [phone]  - Dr. Stewart: [phone]  In the event of inclement weather, please call our main line at [phone] for an update on the status of any delays or closures.  Dermatology Medication Tips: Please keep the boxes that topical medications come in in order to help keep track of the instructions about where and how to use these. Pharmacies typically print the medication instructions only on the boxes and not directly on the medication tubes.   If your medication is too expensive, please contact our office at [phone] option 4 or send us a message through MyChart.   We are unable to tell what your co-pay for medications will be in advance as this is different depending on your insurance coverage. However, we may be able to find a substitute medication at lower cost or fill out paperwork to get insurance to cover a needed   medication.   If a prior authorization is required to get your medication covered by your insurance company, please allow us 1-2 business days to complete this process.  Drug prices often vary depending on where the prescription is filled and some pharmacies may offer cheaper prices.  The website www.goodrx.com contains coupons for medications through different pharmacies. The prices here do not account for what the cost may be with help from insurance (it may be cheaper with your insurance), but the website can give you the  price if you did not use any insurance.  - You can print the associated coupon and take it with your prescription to the pharmacy.  - You may also stop by our office during regular business hours and pick up a GoodRx coupon card.  - If you need your prescription sent electronically to a different pharmacy, notify our office through [place] MyChart or by phone at [phone] option 4.   

## 2021-03-28 ENCOUNTER — Other Ambulatory Visit: Payer: Self-pay | Admitting: Nurse Practitioner

## 2021-04-04 ENCOUNTER — Encounter: Payer: Self-pay | Admitting: Nurse Practitioner

## 2021-04-04 ENCOUNTER — Other Ambulatory Visit: Payer: Self-pay

## 2021-04-04 ENCOUNTER — Ambulatory Visit (INDEPENDENT_AMBULATORY_CARE_PROVIDER_SITE_OTHER): Payer: 59 | Admitting: Nurse Practitioner

## 2021-04-04 VITALS — BP 122/80 | HR 71 | Temp 98.6°F | Ht 61.5 in | Wt 152.6 lb

## 2021-04-04 DIAGNOSIS — Z6828 Body mass index (BMI) 28.0-28.9, adult: Secondary | ICD-10-CM | POA: Diagnosis not present

## 2021-04-04 DIAGNOSIS — E1169 Type 2 diabetes mellitus with other specified complication: Secondary | ICD-10-CM | POA: Diagnosis not present

## 2021-04-04 DIAGNOSIS — K219 Gastro-esophageal reflux disease without esophagitis: Secondary | ICD-10-CM | POA: Diagnosis not present

## 2021-04-04 DIAGNOSIS — E1159 Type 2 diabetes mellitus with other circulatory complications: Secondary | ICD-10-CM | POA: Diagnosis not present

## 2021-04-04 DIAGNOSIS — E669 Obesity, unspecified: Secondary | ICD-10-CM

## 2021-04-04 DIAGNOSIS — E785 Hyperlipidemia, unspecified: Secondary | ICD-10-CM

## 2021-04-04 DIAGNOSIS — Z0000 Encounter for general adult medical examination without abnormal findings: Secondary | ICD-10-CM | POA: Diagnosis not present

## 2021-04-04 DIAGNOSIS — R7989 Other specified abnormal findings of blood chemistry: Secondary | ICD-10-CM

## 2021-04-04 DIAGNOSIS — I152 Hypertension secondary to endocrine disorders: Secondary | ICD-10-CM

## 2021-04-04 DIAGNOSIS — R809 Proteinuria, unspecified: Secondary | ICD-10-CM

## 2021-04-04 DIAGNOSIS — J301 Allergic rhinitis due to pollen: Secondary | ICD-10-CM

## 2021-04-04 LAB — MICROALBUMIN, URINE WAIVED
Creatinine, Urine Waived: 50 mg/dL (ref 10–300)
Microalb, Ur Waived: 30 mg/L — ABNORMAL HIGH (ref 0–19)

## 2021-04-04 LAB — BAYER DCA HB A1C WAIVED: HB A1C (BAYER DCA - WAIVED): 5.9 % — ABNORMAL HIGH (ref 4.8–5.6)

## 2021-04-04 MED ORDER — ATORVASTATIN CALCIUM 20 MG PO TABS: 20.0000 mg | ORAL_TABLET | Freq: Every day | ORAL | 4 refills | Status: DC

## 2021-04-04 MED ORDER — VERAPAMIL HCL ER 180 MG PO CP24: 180.0000 mg | ORAL_CAPSULE | Freq: Every day | ORAL | 4 refills | Status: DC

## 2021-04-04 MED ORDER — FLUTICASONE PROPIONATE 50 MCG/ACT NA SUSP: 2.0000 | Freq: Every day | NASAL | 12 refills | Status: DC

## 2021-04-04 MED ORDER — OMEPRAZOLE 20 MG PO CPDR: 20.0000 mg | DELAYED_RELEASE_CAPSULE | Freq: Every day | ORAL | 4 refills | Status: DC

## 2021-04-04 NOTE — Progress Notes (Signed)
BP 122/80   Pulse 71   Temp 98.6 F (37 C) (Oral)   Ht 5' 1.5" (1.562 m)   Wt 152 lb 9.6 oz (69.2 kg)   LMP 10/14/1992 (Approximate)   SpO2 98%   BMI 28.37 kg/m    Subjective:    Patient ID: Linda Boyle, female    DOB: 1961-07-02, 60 y.o.   MRN: [id number]  HPI: Linda Boyle is a 60 y.o. female presenting on 04/04/2021 for comprehensive medical examination. Current medical complaints include:none  She currently lives with: daughter Menopausal Symptoms: no   DIABETES Continues to be diet controlled -- last A1c 5.9% March. Hypoglycemic episodes:no Polydipsia/polyuria: no Visual disturbance: no Chest pain: no Paresthesias: no Glucose Monitoring: yes  Accucheck frequency:  occasional  Fasting glucose: last on the 11th = 106  Post prandial:  Evening:  Before meals: Taking Insulin?: no  Long acting insulin:  Short acting insulin: Blood Pressure Monitoring: a few times a week Retinal Examination: Not up to Date Foot Exam: Up to Date Pneumovax: Up to Date Influenza: Up to Date Aspirin: no   HYPERTENSION Cough has presented with Lisinopril and Losartan.  Currently taking Verapamil, HCTZ in past has caused low K+.   Continues on Atorvastatin 20 MG.  Takes Vitamin D supplement daily for low D levels.  Last LDL mild above goal at 119 and LFT's noted mild elevations 53/71 ALT/AST. Hypertension status: controlled  Satisfied with current treatment? yes Duration of hypertension: chronic BP monitoring frequency:  a few times a week BP range: <130/70 range at home BP medication side effects:  no Medication compliance: good compliance Aspirin: no Recurrent headaches: no Visual changes: no Palpitations: no Dyspnea: no Chest pain: no Lower extremity edema: no Dizzy/lightheaded: no The 10-year ASCVD risk score (Arnett DK, et al., 2019) is: 15.1%   Values used to calculate the score:     Age: 81 years     Sex: Female     Is Non-Hispanic African American: Yes      Diabetic: Yes     Tobacco smoker: No     Systolic Blood Pressure: 546 mmHg     Is BP treated: Yes     HDL Cholesterol: 41 mg/dL     Total Cholesterol: 177 mg/dL    GERD Takes Prilosec 20 MG daily.  Takes her Flonase and Allegra consistently for allergic rhinitis. GERD control status: stable  Satisfied with current treatment? yes Heartburn frequency: none Medication side effects: no  Medication compliance: stable Dysphagia: no Odynophagia:  no Hematemesis: no Blood in stool: no EGD: yes  Depression Screen done today and results listed below:  Depression screen [facility] 2/9 04/04/2021 02/19/2020 12/05/2019 09/26/2019 09/13/2018  Decreased Interest 0 0 0 1 0  Down, Depressed, Hopeless 0 0 0 0 0  PHQ - 2 Score 0 0 0 1 0  Altered sleeping - - - - 0  Tired, decreased energy - - - - 0  Change in appetite - - - - 0  Feeling bad or failure about yourself  - - - - 0  Trouble concentrating - - - - 0  Moving slowly or fidgety/restless - - - - 0  Suicidal thoughts - - - - 0  PHQ-9 Score - - - - 0  Difficult doing work/chores - - - - Not difficult at all    Fall Risk  04/04/2021 04/01/2020 03/18/2020 03/04/2020 02/19/2020  Falls in the past year? 0 0 0 0 0  Number falls in  past yr: 0 - - - -  Injury with Fall? 0 - - - -  Risk for fall due to : No Fall Risks - - - -  Follow up Falls evaluation completed - - - -    Functional Status Survey: Is the patient deaf or have difficulty hearing?: No Does the patient have difficulty seeing, even when wearing glasses/contacts?: No Does the patient have difficulty concentrating, remembering, or making decisions?: No Does the patient have difficulty walking or climbing stairs?: No Does the patient have difficulty dressing or bathing?: No Does the patient have difficulty doing errands alone such as visiting a doctor's office or shopping?: No   Past Medical History:  Past Medical History:  Diagnosis Date   Allergy    Diabetes mellitus without complication  ([place])    GERD (gastroesophageal reflux disease)    Hyperlipidemia    Hypertension    Mitral valve prolapse    Neck sprain    Vertigo     Surgical History:  Past Surgical History:  Procedure Laterality Date   ABDOMINAL HYSTERECTOMY     partial   DILATION AND CURETTAGE OF UTERUS  8341,9622   TOTAL VAGINAL HYSTERECTOMY  1994   Ovaries remain   TUBAL LIGATION      Medications:  Current Outpatient Medications on File Prior to Visit  Medication Sig   blood glucose meter kit and supplies KIT Dispense based on patient and insurance preference. Use up to four times daily as directed. (FOR ICD-9 250.00, 250.01).   Cholecalciferol (VITAMIN D-3) 1000 UNITS CAPS Take 1,000 Units by mouth daily.   loratadine (CLARITIN) 10 MG tablet Take 10 mg by mouth daily.   meclizine (ANTIVERT) 25 MG tablet Take 1 tablet (25 mg total) by mouth 3 (three) times daily as needed for dizziness.   No current facility-administered medications on file prior to visit.    Allergies:  Allergies  Allergen Reactions   Lisinopril     cough   Losartan Cough    Social History:  Social History   Socioeconomic History   Marital status: Widowed    Spouse name: Not on file   Number of children: Not on file   Years of education: Not on file   Highest education level: Not on file  Occupational History   Not on file  Tobacco Use   Smoking status: Never   Smokeless tobacco: Never  Vaping Use   Vaping Use: Never used  Substance and Sexual Activity   Alcohol use: No   Drug use: No   Sexual activity: Not Currently  Other Topics Concern   Not on file  Social History Narrative   Not on file   Social Determinants of Health   Financial Resource Strain: Low Risk    Difficulty of Paying Living Expenses: Not hard at all  Food Insecurity: No Food Insecurity   Worried About Running Out of Food in the Last Year: Never true   [place] in the Last Year: Never true  Transportation Needs: No Transportation  Needs   Lack of Transportation (Medical): No   Lack of Transportation (Non-Medical): No  Physical Activity: Insufficiently Active   Days of Exercise per Week: 4 days   Minutes of Exercise per Session: 30 min  Stress: No Stress Concern Present   Feeling of Stress : Not at all  Social Connections: Moderately Isolated   Frequency of Communication with Friends and Family: More than three times a week   Frequency of  Social Gatherings with Friends and Family: More than three times a week   Attends Religious Services: More than 4 times per year   Active Member of [employer] or Organizations: No   Attends Archivist Meetings: Never   Marital Status: Widowed  Human resources officer Violence: Not At Risk   Fear of Current or Ex-Partner: No   Emotionally Abused: No   Physically Abused: No   Sexually Abused: No   Social History   Tobacco Use  Smoking Status Never  Smokeless Tobacco Never   Social History   Substance and Sexual Activity  Alcohol Use No    Family History:  Family History  Problem Relation Age of Onset   Diabetes Mother    Alzheimer's disease Father    Hypertension Father    Diabetes Sister    Hypertension Brother    Asthma Brother    Diabetes Brother    Hypertension Sister    Breast cancer Neg Hx     Past medical history, surgical history, medications, allergies, family history and social history reviewed with patient today and changes made to appropriate areas of the chart.   Review of Systems - negative All other ROS negative except what is listed above and in the HPI.      Objective:    BP 122/80   Pulse 71   Temp 98.6 F (37 C) (Oral)   Ht 5' 1.5" (1.562 m)   Wt 152 lb 9.6 oz (69.2 kg)   LMP 10/14/1992 (Approximate)   SpO2 98%   BMI 28.37 kg/m   Wt Readings from Last 3 Encounters:  04/04/21 152 lb 9.6 oz (69.2 kg)  09/30/20 154 lb 3.2 oz (69.9 kg)  04/02/20 156 lb (70.8 kg)    Physical Exam Vitals and nursing note reviewed.   Constitutional:      General: She is awake. She is not in acute distress.    Appearance: She is well-developed. She is not ill-appearing.  HENT:     Head: Normocephalic and atraumatic.     Right Ear: Hearing, tympanic membrane, ear canal and external ear normal. No drainage.     Left Ear: Hearing, tympanic membrane, ear canal and external ear normal. No drainage.     Nose: Nose normal.     Right Sinus: No maxillary sinus tenderness or frontal sinus tenderness.     Left Sinus: No maxillary sinus tenderness or frontal sinus tenderness.     Mouth/Throat:     Mouth: Mucous membranes are moist.     Pharynx: Oropharynx is clear. Uvula midline. No pharyngeal swelling, oropharyngeal exudate or posterior oropharyngeal erythema.  Eyes:     General: Lids are normal.        Right eye: No discharge.        Left eye: No discharge.     Extraocular Movements: Extraocular movements intact.     Conjunctiva/sclera: Conjunctivae normal.     Pupils: Pupils are equal, round, and reactive to light.     Visual Fields: Right eye visual fields normal and left eye visual fields normal.  Neck:     Thyroid: No thyromegaly.     Vascular: No carotid bruit.     Trachea: Trachea normal.  Cardiovascular:     Rate and Rhythm: Normal rate and regular rhythm.     Heart sounds: Normal heart sounds. No murmur heard.   No gallop.  Pulmonary:     Effort: Pulmonary effort is normal. No accessory muscle usage or respiratory distress.  Breath sounds: Normal breath sounds.  Chest:  Breasts:    Right: Normal.     Left: Normal.  Abdominal:     General: Bowel sounds are normal.     Palpations: Abdomen is soft. There is no hepatomegaly or splenomegaly.     Tenderness: There is no abdominal tenderness.  Musculoskeletal:        General: Normal range of motion.     Cervical back: Normal range of motion and neck supple.     Right lower leg: No edema.     Left lower leg: No edema.  Lymphadenopathy:     Head:     Right  side of head: No submental, submandibular, tonsillar, preauricular or posterior auricular adenopathy.     Left side of head: No submental, submandibular, tonsillar, preauricular or posterior auricular adenopathy.     Cervical: No cervical adenopathy.     Upper Body:     Right upper body: No supraclavicular, axillary or pectoral adenopathy.     Left upper body: No supraclavicular, axillary or pectoral adenopathy.  Skin:    General: Skin is warm and dry.     Capillary Refill: Capillary refill takes less than 2 seconds.     Findings: No rash.  Neurological:     Mental Status: She is alert and oriented to person, place, and time.     Cranial Nerves: Cranial nerves are intact.     Gait: Gait is intact.     Deep Tendon Reflexes: Reflexes are normal and symmetric.     Reflex Scores:      Brachioradialis reflexes are 2+ on the right side and 2+ on the left side.      Patellar reflexes are 2+ on the right side and 2+ on the left side. Psychiatric:        Attention and Perception: Attention normal.        Mood and Affect: Mood normal.        Speech: Speech normal.        Behavior: Behavior normal. Behavior is cooperative.        Thought Content: Thought content normal.        Judgment: Judgment normal.   Diabetic Foot Exam - Simple   Simple Foot Form Visual Inspection No deformities, no ulcerations, no other skin breakdown bilaterally: Yes Sensation Testing Intact to touch and monofilament testing bilaterally: Yes Pulse Check Posterior Tibialis and Dorsalis pulse intact bilaterally: Yes Comments     Results for orders placed or performed in visit on 09/30/20  Comprehensive metabolic panel  Result Value Ref Range   Glucose 102 (H) 65 - 99 mg/dL   BUN 13 8 - 27 mg/dL   Creatinine, Ser 0.89 0.57 - 1.00 mg/dL   eGFR 74 >59 mL/min/1.73   BUN/Creatinine Ratio 15 12 - 28   Sodium 142 134 - 144 mmol/L   Potassium 3.4 (L) 3.5 - 5.2 mmol/L   Chloride 102 96 - 106 mmol/L   CO2 26 20 - 29  mmol/L   Calcium 9.1 8.7 - 10.3 mg/dL   Total Protein 7.0 6.0 - 8.5 g/dL   Albumin 4.1 3.8 - 4.9 g/dL   Globulin, Total 2.9 1.5 - 4.5 g/dL   Albumin/Globulin Ratio 1.4 1.2 - 2.2   Bilirubin Total 0.7 0.0 - 1.2 mg/dL   Alkaline Phosphatase 78 44 - 121 IU/L   AST 53 (H) 0 - 40 IU/L   ALT 71 (H) 0 - 32 IU/L  Lipid Panel w/o Chol/HDL  Ratio  Result Value Ref Range   Cholesterol, Total 177 100 - 199 mg/dL   Triglycerides 90 0 - 149 mg/dL   HDL 41 >39 mg/dL   VLDL Cholesterol Cal 17 5 - 40 mg/dL   LDL Chol Calc (NIH) 119 (H) 0 - 99 mg/dL  Bayer DCA Hb A1c Waived  Result Value Ref Range   HB A1C (BAYER DCA - WAIVED) 5.9 <7.0 %  Microalbumin, Urine Waived  Result Value Ref Range   Microalb, Ur Waived 150 (H) 0 - 19 mg/L   Creatinine, Urine Waived 300 10 - 300 mg/dL   Microalb/Creat Ratio 30-300 (H) <30 mg/g      Assessment & Plan:   Problem List Items Addressed This Visit       Cardiovascular and Mediastinum   Hypertension associated with diabetes (HCC)    Chronic, ongoing with BP at goal today.  Does not tolerate ACE or ARB -- cough and HCTZ drops K+.  Recommend she continue to check BP at least 3 days at week at home and document.  Continue current medication regimen with Verapamil and adjust as needed.  DASH diet focus at home.  Labs: CBC, CMP, TSH today.  Return in 6 months for follow-up.  Refills sent in.      Relevant Medications   atorvastatin (LIPITOR) 20 MG tablet   verapamil (VERELAN PM) 180 MG 24 hr capsule   Other Relevant Orders   Bayer DCA Hb A1c Waived   Comprehensive metabolic panel   CBC with Differential/Platelet   TSH     Respiratory   Allergic rhinitis    Chronic, stable.  Continue current medication regimen and adjust as needed.  Recommend avoiding triggers when possible.        Digestive   GERD (gastroesophageal reflux disease)    Chronic, stable.  Continue current medication regimen and adjust as needed.  Educated on risks of long term PPI use and  wishes to continue.  Check Mag level.      Relevant Medications   omeprazole (PRILOSEC) 20 MG capsule   Other Relevant Orders   Magnesium     Endocrine   Hyperlipidemia associated with type 2 diabetes mellitus (HCC)    Chronic, ongoing.  Check lipid panel.  Consider increasing Atorvastatin if LDL above goal.  Is fasting today.      Relevant Medications   atorvastatin (LIPITOR) 20 MG tablet   Other Relevant Orders   Bayer DCA Hb A1c Waived   Lipid Panel w/o Chol/HDL Ratio   Type 2 diabetes mellitus with obesity ([place]) - Primary    Ongoing stable with diet only control.  A1c today 5.9%, below goal, and urine ALB 150 last visit (recheck today), would benefit ACE or ARB, but is allergic to both.  Recommend she check BS every morning with goal <130.  Focus on diet control at home.  Return in 6 months.      Relevant Medications   atorvastatin (LIPITOR) 20 MG tablet   Other Relevant Orders   Bayer DCA Hb A1c Waived     Other   BMI 28.0-28.9,adult    BMI 28.37.  Recommended eating smaller high protein, low fat meals more frequently and exercising 30 mins a day 5 times a week with a goal of 10-15lb weight loss in the next 3 months. Patient voiced their understanding and motivation to adhere to these recommendations.       Microalbuminuria    Check urine micro and kidney function today, adjust  regimen as needed. Urine ALB 150 and A:C 30-300 last visit, recheck today.  Has no current ACE or ARB due to past reactions, monitor closely.      Relevant Orders   Microalbumin, Urine Waived   Elevated LFTs    CMP today, consider ultrasound if worsening.      Other Visit Diagnoses     Encounter for annual physical exam       Annual physical with labs today + health maintenance reviewed -- refuses Shingrix and flu to obtain at [facility].        Follow up plan: Return in about 6 months (around 10/02/2021) for HTN/HLD, T2DM.   LABORATORY TESTING:  - Pap smear: not applicable -- no  cervix  IMMUNIZATIONS:   - Tdap: Tetanus vaccination status reviewed: last tetanus booster within 10 years. - Influenza: will obtain at [facility] - Pneumovax: Up To Date in 2022 - Prevnar: Not applicable - HPV: Not applicable - Zostavax vaccine:  will talk to insurance  SCREENING: -Mammogram: Up to date -- last 12/13/20 - Colonoscopy: Up to date -- due 09/05/21 - Bone Density: Not applicable  -Hearing Test: Not applicable  -Spirometry: Not applicable   PATIENT COUNSELING:   Advised to take 1 mg of folate supplement per day if capable of pregnancy.   Sexuality: Discussed sexually transmitted diseases, partner selection, use of condoms, avoidance of unintended pregnancy  and contraceptive alternatives.   Advised to avoid cigarette smoking.  I discussed with the patient that most people either abstain from alcohol or drink within safe limits (<=14/week and <=4 drinks/occasion for males, <=7/weeks and <= 3 drinks/occasion for females) and that the risk for alcohol disorders and other health effects rises proportionally with the number of drinks per week and how often a drinker exceeds daily limits.  Discussed cessation/primary prevention of drug use and availability of treatment for abuse.   Diet: Encouraged to adjust caloric intake to maintain  or achieve ideal body weight, to reduce intake of dietary saturated fat and total fat, to limit sodium intake by avoiding high sodium foods and not adding table salt, and to maintain adequate dietary potassium and calcium preferably from fresh fruits, vegetables, and low-fat dairy products.    Stressed the importance of regular exercise  Injury prevention: Discussed safety belts, safety helmets, smoke detector, smoking near bedding or upholstery.   Dental health: Discussed importance of regular tooth brushing, flossing, and dental visits.    NEXT PREVENTATIVE PHYSICAL DUE IN 1 YEAR. Return in about 6 months (around 10/02/2021) for HTN/HLD,  T2DM.

## 2021-04-04 NOTE — Patient Instructions (Signed)
Diabetes Mellitus and Nutrition, Adult When you have diabetes, or diabetes mellitus, it is very important to have healthy eating habits because your blood sugar (glucose) levels are greatly affected by what you eat and drink. Eating healthy foods in the right amounts, at about the same times every day, can help you:  Control your blood glucose.  Lower your risk of heart disease.  Improve your blood pressure.  Reach or maintain a healthy weight. What can affect my meal plan? Every person with diabetes is different, and each person has different needs for a meal plan. Your health care provider may recommend that you work with a dietitian to make a meal plan that is best for you. Your meal plan may vary depending on factors such as:  The calories you need.  The medicines you take.  Your weight.  Your blood glucose, blood pressure, and cholesterol levels.  Your activity level.  Other health conditions you have, such as heart or kidney disease. How do carbohydrates affect me? Carbohydrates, also called carbs, affect your blood glucose level more than any other type of food. Eating carbs naturally raises the amount of glucose in your blood. Carb counting is a method for keeping track of how many carbs you eat. Counting carbs is important to keep your blood glucose at a healthy level, especially if you use insulin or take certain oral diabetes medicines. It is important to know how many carbs you can safely have in each meal. This is different for every person. Your dietitian can help you calculate how many carbs you should have at each meal and for each snack. How does alcohol affect me? Alcohol can cause a sudden decrease in blood glucose (hypoglycemia), especially if you use insulin or take certain oral diabetes medicines. Hypoglycemia can be a life-threatening condition. Symptoms of hypoglycemia, such as sleepiness, dizziness, and confusion, are similar to symptoms of having too much  alcohol.  Do not drink alcohol if: ? Your health care provider tells you not to drink. ? You are pregnant, may be pregnant, or are planning to become pregnant.  If you drink alcohol: ? Do not drink on an empty stomach. ? Limit how much you use to:  0-1 drink a day for women.  0-2 drinks a day for men. ? Be aware of how much alcohol is in your drink. In the [place] one drink equals one 12 oz bottle of beer (355 mL), one 5 oz glass of wine (148 mL), or one 1 oz glass of hard liquor (44 mL). ? Keep yourself hydrated with water, diet soda, or unsweetened iced tea.  Keep in mind that regular soda, juice, and other mixers may contain a lot of sugar and must be counted as carbs. What are tips for following this plan? Reading food labels  Start by checking the serving size on the "Nutrition Facts" label of packaged foods and drinks. The amount of calories, carbs, fats, and other nutrients listed on the label is based on one serving of the item. Many items contain more than one serving per package.  Check the total grams (g) of carbs in one serving. You can calculate the number of servings of carbs in one serving by dividing the total carbs by 15. For example, if a food has 30 g of total carbs per serving, it would be equal to 2 servings of carbs.  Check the number of grams (g) of saturated fats and trans fats in one serving. Choose foods that have   a low amount or none of these fats.  Check the number of milligrams (mg) of salt (sodium) in one serving. Most people should limit total sodium intake to less than 2,300 mg per day.  Always check the nutrition information of foods labeled as "low-fat" or "nonfat." These foods may be higher in added sugar or refined carbs and should be avoided.  Talk to your dietitian to identify your daily goals for nutrients listed on the label. Shopping  Avoid buying canned, pre-made, or processed foods. These foods tend to be high in fat, sodium, and added  sugar.  Shop around the outside edge of the grocery store. This is where you will most often find fresh fruits and vegetables, bulk grains, fresh meats, and fresh dairy. Cooking  Use low-heat cooking methods, such as baking, instead of high-heat cooking methods like deep frying.  Cook using healthy oils, such as olive, canola, or sunflower oil.  Avoid cooking with butter, cream, or high-fat meats. Meal planning  Eat meals and snacks regularly, preferably at the same times every day. Avoid going long periods of time without eating.  Eat foods that are high in fiber, such as fresh fruits, vegetables, beans, and whole grains. Talk with your dietitian about how many servings of carbs you can eat at each meal.  Eat 4-6 oz (112-168 g) of lean protein each day, such as lean meat, chicken, fish, eggs, or tofu. One ounce (oz) of lean protein is equal to: ? 1 oz (28 g) of meat, chicken, or fish. ? 1 egg. ?  cup (62 g) of tofu.  Eat some foods each day that contain healthy fats, such as avocado, nuts, seeds, and fish.   What foods should I eat? Fruits Berries. Apples. Oranges. Peaches. Apricots. Plums. Grapes. Mango. Papaya. Pomegranate. Kiwi. Cherries. Vegetables Lettuce. Spinach. Leafy greens, including kale, chard, collard greens, and mustard greens. Beets. Cauliflower. Cabbage. Broccoli. Carrots. Green beans. Tomatoes. Peppers. Onions. Cucumbers. Brussels sprouts. Grains Whole grains, such as whole-wheat or whole-grain bread, crackers, tortillas, cereal, and pasta. Unsweetened oatmeal. Quinoa. Brown or wild rice. Meats and other proteins Seafood. Poultry without skin. Lean cuts of poultry and beef. Tofu. Nuts. Seeds. Dairy Low-fat or fat-free dairy products such as milk, yogurt, and cheese. The items listed above may not be a complete list of foods and beverages you can eat. Contact a dietitian for more information. What foods should I avoid? Fruits Fruits canned with  syrup. Vegetables Canned vegetables. Frozen vegetables with butter or cream sauce. Grains Refined white flour and flour products such as bread, pasta, snack foods, and cereals. Avoid all processed foods. Meats and other proteins Fatty cuts of meat. Poultry with skin. Breaded or fried meats. Processed meat. Avoid saturated fats. Dairy Full-fat yogurt, cheese, or milk. Beverages Sweetened drinks, such as soda or iced tea. The items listed above may not be a complete list of foods and beverages you should avoid. Contact a dietitian for more information. Questions to ask a health care provider  Do I need to meet with a diabetes educator?  Do I need to meet with a dietitian?  What number can I call if I have questions?  When are the best times to check my blood glucose? Where to find more information:  American Diabetes Association: diabetes.org  Academy of Nutrition and Dietetics: www.eatright.org  National Institute of Diabetes and Digestive and Kidney Diseases: www.niddk.nih.gov  Association of Diabetes Care and Education Specialists: www.diabeteseducator.org Summary  It is important to have healthy eating   habits because your blood sugar (glucose) levels are greatly affected by what you eat and drink.  A healthy meal plan will help you control your blood glucose and maintain a healthy lifestyle.  Your health care provider may recommend that you work with a dietitian to make a meal plan that is best for you.  Keep in mind that carbohydrates (carbs) and alcohol have immediate effects on your blood glucose levels. It is important to count carbs and to use alcohol carefully. This information is not intended to replace advice given to you by your health care provider. Make sure you discuss any questions you have with your health care provider. Document Revised: 06/03/2019 Document Reviewed: 06/03/2019 Elsevier Patient Education  2021 Elsevier Inc.  

## 2021-04-04 NOTE — Assessment & Plan Note (Signed)
Chronic, stable.  Continue current medication regimen and adjust as needed.  Educated on risks of long term PPI use and wishes to continue.  Check Mag level.

## 2021-04-04 NOTE — Assessment & Plan Note (Signed)
CMP today, consider ultrasound if worsening.

## 2021-04-04 NOTE — Assessment & Plan Note (Signed)
BMI 28.37.  Recommended eating smaller high protein, low fat meals more frequently and exercising 30 mins a day 5 times a week with a goal of 10-15lb weight loss in the next 3 months. Patient voiced their understanding and motivation to adhere to these recommendations.

## 2021-04-04 NOTE — Assessment & Plan Note (Signed)
Check urine micro and kidney function today, adjust regimen as needed. Urine ALB 150 and A:C 30-300 last visit, recheck today.  Has no current ACE or ARB due to past reactions, monitor closely.

## 2021-04-04 NOTE — Assessment & Plan Note (Signed)
Chronic, stable.  Continue current medication regimen and adjust as needed.  Recommend avoiding triggers when possible.

## 2021-04-04 NOTE — Assessment & Plan Note (Signed)
Chronic, ongoing with BP at goal today.  Does not tolerate ACE or ARB -- cough and HCTZ drops K+.  Recommend she continue to check BP at least 3 days at week at home and document.  Continue current medication regimen with Verapamil and adjust as needed.  DASH diet focus at home.  Labs: CBC, CMP, TSH today.  Return in 6 months for follow-up.  Refills sent in.

## 2021-04-04 NOTE — Assessment & Plan Note (Signed)
Ongoing stable with diet only control.  A1c today 5.9%, below goal, and urine ALB 150 last visit (recheck today), would benefit ACE or ARB, but is allergic to both.  Recommend she check BS every morning with goal <130.  Focus on diet control at home.  Return in 6 months.

## 2021-04-04 NOTE — Assessment & Plan Note (Signed)
Chronic, ongoing.  Check lipid panel.  Consider increasing Atorvastatin if LDL above goal.  Is fasting today.

## 2021-04-05 LAB — COMPREHENSIVE METABOLIC PANEL
ALT: 67 IU/L — ABNORMAL HIGH (ref 0–32)
AST: 54 IU/L — ABNORMAL HIGH (ref 0–40)
Albumin/Globulin Ratio: 1.5 (ref 1.2–2.2)
Albumin: 4.2 g/dL (ref 3.8–4.8)
Alkaline Phosphatase: 77 IU/L (ref 44–121)
BUN/Creatinine Ratio: 12 (ref 12–28)
BUN: 11 mg/dL (ref 8–27)
Bilirubin Total: 0.4 mg/dL (ref 0.0–1.2)
CO2: 26 mmol/L (ref 20–29)
Calcium: 9.5 mg/dL (ref 8.7–10.3)
Chloride: 100 mmol/L (ref 96–106)
Creatinine, Ser: 0.9 mg/dL (ref 0.57–1.00)
Globulin, Total: 2.8 g/dL (ref 1.5–4.5)
Glucose: 98 mg/dL (ref 70–99)
Potassium: 3.5 mmol/L (ref 3.5–5.2)
Sodium: 139 mmol/L (ref 134–144)
Total Protein: 7 g/dL (ref 6.0–8.5)
eGFR: 73 mL/min/{1.73_m2} (ref >59)

## 2021-04-05 LAB — CBC WITH DIFFERENTIAL/PLATELET
Basophils Absolute: 0 10*3/uL (ref 0.0–0.2)
Basos: 1 %
EOS (ABSOLUTE): 0.1 10*3/uL (ref 0.0–0.4)
Eos: 2 %
Hematocrit: 42.6 % (ref 34.0–46.6)
Hemoglobin: 14.2 g/dL (ref 11.1–15.9)
Immature Grans (Abs): 0 10*3/uL (ref 0.0–0.1)
Immature Granulocytes: 0 %
Lymphocytes Absolute: 2 10*3/uL (ref 0.7–3.1)
Lymphs: 43 %
MCH: 29.7 pg (ref 26.6–33.0)
MCHC: 33.3 g/dL (ref 31.5–35.7)
MCV: 89 fL (ref 79–97)
Monocytes Absolute: 0.4 10*3/uL (ref 0.1–0.9)
Monocytes: 8 %
Neutrophils Absolute: 2.2 10*3/uL (ref 1.4–7.0)
Neutrophils: 46 %
Platelets: 302 10*3/uL (ref 150–450)
RBC: 4.78 x10E6/uL (ref 3.77–5.28)
RDW: 12.7 % (ref 11.7–15.4)
WBC: 4.7 10*3/uL (ref 3.4–10.8)

## 2021-04-05 LAB — LIPID PANEL W/O CHOL/HDL RATIO
Cholesterol, Total: 195 mg/dL (ref 100–199)
HDL: 48 mg/dL (ref >39)
LDL Chol Calc (NIH): 126 mg/dL — ABNORMAL HIGH (ref 0–99)
Triglycerides: 114 mg/dL (ref 0–149)
VLDL Cholesterol Cal: 21 mg/dL (ref 5–40)

## 2021-04-05 LAB — TSH: TSH: 1.82 u[IU]/mL (ref 0.450–4.500)

## 2021-04-05 LAB — MAGNESIUM: Magnesium: 2.1 mg/dL (ref 1.6–2.3)

## 2021-04-05 MED ORDER — ATORVASTATIN CALCIUM 40 MG PO TABS: 40.0000 mg | ORAL_TABLET | Freq: Every day | ORAL | 4 refills | Status: DC

## 2021-04-05 NOTE — Addendum Note (Signed)
Addended by: Venita Lick on: 04/05/2021 08:48 AM   Modules accepted: Orders

## 2021-04-05 NOTE — Progress Notes (Signed)
Contacted via [place] morning [place], your labs have returned:  - Kidney function remains stable, creatinine and eGFR.  Liver function, AST and ALT, continues to have mild elevations -- if ongoing next visit I do recommend we obtain imaging via ultrasound of liver. - CBC is normal with no anemia - Thyroid and magnesium levels normal  - Cholesterol levels continue to show LDL above goal.  I do recommend we increase Atorvastatin to 40 MG daily, if you have 20 MG tablets left take two of these daily and I will send in new dosing.  Will recheck levels next visit.  Any questions? Keep being awesome!!  Thank you for allowing me to participate in your care.  I appreciate you. Kindest regards, Amy Gothard

## 2021-06-23 ENCOUNTER — Ambulatory Visit: Payer: 59 | Admitting: Dermatology

## 2021-09-21 ENCOUNTER — Ambulatory Visit: Payer: Medicaid Other | Admitting: Dermatology

## 2021-10-01 NOTE — Patient Instructions (Signed)

## 2021-10-03 ENCOUNTER — Ambulatory Visit (INDEPENDENT_AMBULATORY_CARE_PROVIDER_SITE_OTHER): Payer: 59 | Admitting: Nurse Practitioner

## 2021-10-03 ENCOUNTER — Encounter: Payer: Self-pay | Admitting: Nurse Practitioner

## 2021-10-03 ENCOUNTER — Other Ambulatory Visit: Payer: Self-pay

## 2021-10-03 VITALS — BP 118/80 | HR 67 | Temp 98.0°F | Ht 61.5 in | Wt 156.8 lb

## 2021-10-03 DIAGNOSIS — E1169 Type 2 diabetes mellitus with other specified complication: Secondary | ICD-10-CM

## 2021-10-03 DIAGNOSIS — Z6828 Body mass index (BMI) 28.0-28.9, adult: Secondary | ICD-10-CM

## 2021-10-03 DIAGNOSIS — Z1211 Encounter for screening for malignant neoplasm of colon: Secondary | ICD-10-CM

## 2021-10-03 DIAGNOSIS — E785 Hyperlipidemia, unspecified: Secondary | ICD-10-CM

## 2021-10-03 DIAGNOSIS — I152 Hypertension secondary to endocrine disorders: Secondary | ICD-10-CM

## 2021-10-03 DIAGNOSIS — E1159 Type 2 diabetes mellitus with other circulatory complications: Secondary | ICD-10-CM

## 2021-10-03 DIAGNOSIS — E1129 Type 2 diabetes mellitus with other diabetic kidney complication: Secondary | ICD-10-CM

## 2021-10-03 DIAGNOSIS — R809 Proteinuria, unspecified: Secondary | ICD-10-CM

## 2021-10-03 DIAGNOSIS — R7989 Other specified abnormal findings of blood chemistry: Secondary | ICD-10-CM

## 2021-10-03 DIAGNOSIS — E669 Obesity, unspecified: Secondary | ICD-10-CM

## 2021-10-03 LAB — BAYER DCA HB A1C WAIVED: HB A1C (BAYER DCA - WAIVED): 5.6 % (ref 4.8–5.6)

## 2021-10-03 LAB — MICROALBUMIN, URINE WAIVED
Creatinine, Urine Waived: 200 mg/dL (ref 10–300)
Microalb, Ur Waived: 80 mg/L — ABNORMAL HIGH (ref 0–19)
Microalb/Creat Ratio: <30 mg/g (ref <30)

## 2021-10-03 MED ORDER — ATORVASTATIN CALCIUM 40 MG PO TABS: 40.0000 mg | ORAL_TABLET | Freq: Every day | ORAL | 4 refills | Status: DC

## 2021-10-03 NOTE — Assessment & Plan Note (Addendum)
Chronic, ongoing.  Check lipid panel.  Has not started Atorvastatin 40 MG, will send this in and recommend she start.  Fasting labs. ?

## 2021-10-03 NOTE — Assessment & Plan Note (Signed)
Ongoing stable with diet only control.  A1c today 5.6%, well below goal, and urine ALB 150 last visit (recheck today), would benefit ACE or ARB, but is allergic to both.  Recommend she check BS every morning with goal <130.  Focus on diet control at home.  Return in 6 months. ?

## 2021-10-03 NOTE — Assessment & Plan Note (Signed)
Ongoing stable with diet only control.  A1c today 5.6%, below goal, and urine ALB 150 last visit (recheck today), would benefit ACE or ARB, but is allergic to both.  Recommend she check BS every morning with goal <130.  Focus on diet control at home.  Return in 6 months. ?

## 2021-10-03 NOTE — Assessment & Plan Note (Signed)
Chronic, ongoing with BP at goal today.  Does not tolerate ACE or ARB -- cough and HCTZ drops K+.  Recommend she continue to check BP at least 3 days at week at home and document.  Continue current medication regimen with Verapamil and adjust as needed.  DASH diet focus at home.  Labs: CMP and urine ALB.  Return in 6 months for follow-up.   ?

## 2021-10-03 NOTE — Assessment & Plan Note (Addendum)
Ongoing since January 2017, mild elevation and stable.  CMP today, consider ultrasound if worsening. ?

## 2021-10-03 NOTE — Assessment & Plan Note (Signed)
BMI 29.15, slight gain.  Recommended eating smaller high protein, low fat meals more frequently and exercising 30 mins a day 5 times a week with a goal of 10-15lb weight loss in the next 3 months. Patient voiced their understanding and motivation to adhere to these recommendations. ? ?

## 2021-10-03 NOTE — Progress Notes (Signed)
? ?BP 118/80 (BP Location: Left Arm, Patient Position: Sitting, Cuff Size: Normal)   Pulse 67   Temp 98 ?F (36.7 ?C) (Oral)   Ht 5' 1.5" (1.562 m)   Wt 156 lb 12.8 oz (71.1 kg)   LMP 10/14/1992 (Approximate)   SpO2 98%   BMI 29.15 kg/m?   ? ?Subjective:  ? ? Patient ID: Linda Boyle, female    DOB: 05-07-1961, 61 y.o.   MRN: [id number] ? ?HPI: ?Linda Boyle is a 61 y.o. female presenting for follow up. ? ?Chief Complaint  ?Patient presents with  ? Hyperlipidemia  ? Hypertension  ? Diabetes  ?  Patient recent Diabetic Eye Exam at today's visit. Patient states she went to [facility] and unable to received exam report.   ? ?DIABETES ?Currently treating with diet and lifestyle changes.  Last A1c 5.9% in September. ?Hypoglycemic episodes:no ?Polydipsia/polyuria: no ?Visual disturbance: none ?Chest pain: no ?Paresthesias: no ?Glucose Monitoring: yes ? Accucheck frequency: occasional ? Fasting glucose: 120 or less range ? Post prandial:  ? At bedtime:  ?Taking Insulin?: no ?Blood Pressure Monitoring: weekly ?Retinal Examination: Up To Date -- Eye Lab ?Foot Exam: Up To Date ?Diabetic Education: Completed in 2022 ?Pneumovax: Up To Date ?Influenza: Up to Date ?Aspirin: no ? ?HYPERTENSION / HYPERLIPIDEMIA ?Continues on Verapamil 180 MG daily + Atorvastatin 40 MG daily (increased last visit, but has not started -- still taking 20 MG).  In past has ARB/ACE cough history -- does have noted proteinuria on labs.  Also noted to have ongoing mild elevation LFTs on labs, starting in January 2017 and remains stable. ?Satisfied with current treatment? no ?Duration of hypertension: chronic ?BP monitoring frequency: weekly ?BP range: 130/80 range on average ?BP medication side effects: no ?Past BP meds: verapamil 180 mg ?Duration of hyperlipidemia: chronic ?Cholesterol medication side effects: no ?Cholesterol supplements: none ?Past cholesterol medications:  Atorvastatin  ?Medication compliance: excellent  compliance ?Aspirin: no ?Recent stressors: no ?Recurrent headaches: occasional with allergies ?Visual changes: none ?Palpitations: no ?Dyspnea: no ?Chest pain: no ?Lower extremity edema: no ?Dizzy/lightheaded: no ?The 10-year ASCVD risk score (Arnett DK, et al., 2019) is: 13.9% ?  Values used to calculate the score: ?    Age: 35 years ?    Sex: Female ?    Is Non-Hispanic African American: Yes ?    Diabetic: Yes ?    Tobacco smoker: No ?    Systolic Blood Pressure: 438 mmHg ?    Is BP treated: Yes ?    HDL Cholesterol: 48 mg/dL ?    Total Cholesterol: 195 mg/dL  ? ?Allergies  ?Allergen Reactions  ? Lisinopril   ?  cough  ? Losartan Cough  ? ?Outpatient Encounter Medications as of 10/03/2021  ?Medication Sig Note  ? blood glucose meter kit and supplies KIT Dispense based on patient and insurance preference. Use up to four times daily as directed. (FOR ICD-9 250.00, 250.01).   ? Cholecalciferol (VITAMIN D-3) 1000 UNITS CAPS Take 1,000 Units by mouth daily.   ? fluticasone (FLONASE) 50 MCG/ACT nasal spray Place 2 sprays into both nostrils daily.   ? loratadine (CLARITIN) 10 MG tablet Take 10 mg by mouth daily.   ? meclizine (ANTIVERT) 25 MG tablet Take 1 tablet (25 mg total) by mouth 3 (three) times daily as needed for dizziness. 05/28/2019: As needed  ? omeprazole (PRILOSEC) 20 MG capsule Take 1 capsule (20 mg total) by mouth daily.   ? verapamil (VERELAN PM) 180 MG 24 hr  capsule Take 1 capsule (180 mg total) by mouth at bedtime.   ? [DISCONTINUED] atorvastatin (LIPITOR) 40 MG tablet Take 1 tablet (40 mg total) by mouth daily.   ? atorvastatin (LIPITOR) 40 MG tablet Take 1 tablet (40 mg total) by mouth daily.   ? ?No facility-administered encounter medications on file as of 10/03/2021.  ? ?Patient Active Problem List  ? Diagnosis Date Noted  ? Elevated LFTs 09/29/2019  ? Type 2 diabetes mellitus with proteinuria ([place]) 09/26/2019  ? Type 2 diabetes mellitus with obesity ([place]) 09/13/2018  ? BMI 28.0-28.9,adult 09/07/2017   ? Atrophic vaginitis 01/26/2017  ? Allergic rhinitis 07/16/2015  ? GERD (gastroesophageal reflux disease) 07/16/2015  ? Hyperlipidemia associated with type 2 diabetes mellitus ([place]) 07/16/2015  ? Hypertension associated with diabetes ([place]) 07/16/2015  ? ?Past Medical History:  ?Diagnosis Date  ? Allergy   ? Diabetes mellitus without complication ([place])   ? GERD (gastroesophageal reflux disease)   ? Hyperlipidemia   ? Hypertension   ? Mitral valve prolapse   ? Neck sprain   ? Vertigo   ? ?Relevant past medical, surgical, family and social history reviewed and updated as indicated. Interim medical history since our last visit reviewed. ? ?Review of Systems  ?Constitutional: Negative.  Negative for activity change, appetite change, fatigue and fever.  ?HENT: Negative.    ?Respiratory: Negative.    ?Cardiovascular: Negative.   ?Gastrointestinal: Negative.   ?Genitourinary: Negative.   ?Neurological: Negative.   ?Psychiatric/Behavioral: Negative.    ? ?Per HPI unless specifically indicated above ? ?   ?Objective:  ?  ?BP 118/80 (BP Location: Left Arm, Patient Position: Sitting, Cuff Size: Normal)   Pulse 67   Temp 98 ?F (36.7 ?C) (Oral)   Ht 5' 1.5" (1.562 m)   Wt 156 lb 12.8 oz (71.1 kg)   LMP 10/14/1992 (Approximate)   SpO2 98%   BMI 29.15 kg/m?   ?Wt Readings from Last 3 Encounters:  ?10/03/21 156 lb 12.8 oz (71.1 kg)  ?04/04/21 152 lb 9.6 oz (69.2 kg)  ?09/30/20 154 lb 3.2 oz (69.9 kg)  ?  ?Physical Exam ?Vitals and nursing note reviewed.  ?Constitutional:   ?   General: She is not in acute distress. ?   Appearance: Normal appearance. She is obese. She is not toxic-appearing.  ?HENT:  ?   Head: Normocephalic and atraumatic.  ?   Right Ear: External ear normal.  ?   Left Ear: External ear normal.  ?Eyes:  ?   General: No scleral icterus. ?   Extraocular Movements: Extraocular movements intact.  ?Neck:  ?   Vascular: No carotid bruit.  ?Cardiovascular:  ?   Rate and Rhythm: Normal rate and regular rhythm.  ?    Heart sounds: Normal heart sounds. No murmur heard. ?Pulmonary:  ?   Effort: Pulmonary effort is normal. No respiratory distress.  ?   Breath sounds: Normal breath sounds. No wheezing, rhonchi or rales.  ?Abdominal:  ?   General: Abdomen is flat. Bowel sounds are normal.  ?   Palpations: Abdomen is soft.  ?   Tenderness: There is no abdominal tenderness.  ?Musculoskeletal:     ?   General: No swelling or tenderness. Normal range of motion.  ?   Cervical back: Normal range of motion.  ?Skin: ?   General: Skin is warm and dry.  ?   Capillary Refill: Capillary refill takes less than 2 seconds.  ?Neurological:  ?   General: No focal deficit  present.  ?   Mental Status: She is alert and oriented to person, place, and time.  ?   Motor: No weakness.  ?   Gait: Gait normal.  ?Psychiatric:     ?   Mood and Affect: Mood normal.     ?   Behavior: Behavior normal.     ?   Thought Content: Thought content normal.     ?   Judgment: Judgment normal.  ? ?   ?Assessment & Plan:  ? ?Problem List Items Addressed This Visit   ? ?  ? Cardiovascular and Mediastinum  ? Hypertension associated with diabetes ([place])  ?  Chronic, ongoing with BP at goal today.  Does not tolerate ACE or ARB -- cough and HCTZ drops K+.  Recommend she continue to check BP at least 3 days at week at home and document.  Continue current medication regimen with Verapamil and adjust as needed.  DASH diet focus at home.  Labs: CMP and urine ALB.  Return in 6 months for follow-up.   ?  ?  ? Relevant Medications  ? atorvastatin (LIPITOR) 40 MG tablet  ? Other Relevant Orders  ? Bayer DCA Hb A1c Waived  ? Microalbumin, Urine Waived  ? Comprehensive metabolic panel  ?  ? Endocrine  ? Hyperlipidemia associated with type 2 diabetes mellitus ([place])  ?  Chronic, ongoing.  Check lipid panel.  Has not started Atorvastatin 40 MG, will send this in and recommend she start.  Fasting labs. ?  ?  ? Relevant Medications  ? atorvastatin (LIPITOR) 40 MG tablet  ? Other Relevant Orders   ? Bayer DCA Hb A1c Waived  ? Comprehensive metabolic panel  ? Lipid Panel w/o Chol/HDL Ratio  ? Type 2 diabetes mellitus with obesity ([place]) - Primary  ?  Ongoing stable with diet only control.  A1c today 5.6%, below goal, and urine AL

## 2021-10-04 LAB — LIPID PANEL W/O CHOL/HDL RATIO
Cholesterol, Total: 215 mg/dL — ABNORMAL HIGH (ref 100–199)
HDL: 52 mg/dL (ref >39)
LDL Chol Calc (NIH): 141 mg/dL — ABNORMAL HIGH (ref 0–99)
Triglycerides: 122 mg/dL (ref 0–149)
VLDL Cholesterol Cal: 22 mg/dL (ref 5–40)

## 2021-10-04 LAB — COMPREHENSIVE METABOLIC PANEL
ALT: 107 IU/L — ABNORMAL HIGH (ref 0–32)
AST: 79 IU/L — ABNORMAL HIGH (ref 0–40)
Albumin/Globulin Ratio: 1.2 (ref 1.2–2.2)
Albumin: 4.1 g/dL (ref 3.8–4.8)
Alkaline Phosphatase: 77 IU/L (ref 44–121)
BUN/Creatinine Ratio: 16 (ref 12–28)
BUN: 13 mg/dL (ref 8–27)
Bilirubin Total: 0.5 mg/dL (ref 0.0–1.2)
CO2: 24 mmol/L (ref 20–29)
Calcium: 9.1 mg/dL (ref 8.7–10.3)
Chloride: 105 mmol/L (ref 96–106)
Creatinine, Ser: 0.8 mg/dL (ref 0.57–1.00)
Globulin, Total: 3.4 g/dL (ref 1.5–4.5)
Glucose: 99 mg/dL (ref 70–99)
Potassium: 3.7 mmol/L (ref 3.5–5.2)
Sodium: 142 mmol/L (ref 134–144)
Total Protein: 7.5 g/dL (ref 6.0–8.5)
eGFR: 84 mL/min/{1.73_m2} (ref >59)

## 2021-10-04 NOTE — Progress Notes (Signed)
Contacted via [place] ? ? ?Good afternoon Bryanna, your labs have returned: ?- AST and ALT are trending upwards, these are the liver function tests I discussed.  I would recommend we go ahead and get an ultrasound of your liver to assess further.  Would you be okay with this?  Let me know and I will order. ?- Kidney function, creatinine and eGFR, remains normal. ?- Cholesterol levels remain elevated, were you fasting?  Are you taking Atorvastatin daily?  Let me know.  We may need to switch to Rosuvastatin to see if can get better control.  Any questions? ?Keep being amazing!!  Thank you for allowing me to participate in your care.  I appreciate you. ?Kindest regards, ?Sanav Remer ?

## 2021-10-05 ENCOUNTER — Other Ambulatory Visit: Payer: Self-pay

## 2021-10-05 DIAGNOSIS — Z86010 Personal history of colonic polyps: Secondary | ICD-10-CM

## 2021-10-05 MED ORDER — NA SULFATE-K SULFATE-MG SULF 17.5-3.13-1.6 GM/177ML PO SOLN: 1.0000 | Freq: Once | ORAL | 0 refills | Status: AC

## 2021-10-05 NOTE — Progress Notes (Signed)
Gastroenterology Pre-Procedure Review ? ?Request Date: 11/02/2021 ?Requesting Physician: Dr. Marius Ditch ? ?PATIENT REVIEW QUESTIONS: The patient responded to the following health history questions as indicated:   ? ?1. Are you having any GI issues? no ?2. Do you have a personal history of Polyps? yes (last colonoscopy ) ?3. Do you have a family history of Colon Cancer or Polyps? no ?4. Diabetes Mellitus? yes (type 2) ?5. Joint replacements in the past 12 months?no ?6. Major health problems in the past 3 months?no ?7. Any artificial heart valves, MVP, or defibrillator?no ?   ?MEDICATIONS & ALLERGIES:    ?Patient reports the following regarding taking any anticoagulation/antiplatelet therapy:   ?Plavix, Coumadin, Eliquis, Xarelto, Lovenox, Pradaxa, Brilinta, or Effient? no ?Aspirin? no ? ?Patient confirms/reports the following medications:  ?Current Outpatient Medications  ?Medication Sig Dispense Refill  ? atorvastatin (LIPITOR) 40 MG tablet Take 1 tablet (40 mg total) by mouth daily. 90 tablet 4  ? blood glucose meter kit and supplies KIT Dispense based on patient and insurance preference. Use up to four times daily as directed. (FOR ICD-9 250.00, 250.01). 1 each 0  ? Cholecalciferol (VITAMIN D-3) 1000 UNITS CAPS Take 1,000 Units by mouth daily.    ? fluticasone (FLONASE) 50 MCG/ACT nasal spray Place 2 sprays into both nostrils daily. 48 g 12  ? loratadine (CLARITIN) 10 MG tablet Take 10 mg by mouth daily.    ? meclizine (ANTIVERT) 25 MG tablet Take 1 tablet (25 mg total) by mouth 3 (three) times daily as needed for dizziness. 30 tablet 0  ? omeprazole (PRILOSEC) 20 MG capsule Take 1 capsule (20 mg total) by mouth daily. 90 capsule 4  ? verapamil (VERELAN PM) 180 MG 24 hr capsule Take 1 capsule (180 mg total) by mouth at bedtime. 90 capsule 4  ? ?No current facility-administered medications for this visit.  ? ? ?Patient confirms/reports the following allergies:  ?Allergies  ?Allergen Reactions  ? Lisinopril   ?  cough  ?  Losartan Cough  ? ? ?No orders of the defined types were placed in this encounter. ? ? ?AUTHORIZATION INFORMATION ?Primary Insurance: ?1D#: ?Group #: ? ?Secondary Insurance: ?1D#: ?Group #: ? ?SCHEDULE INFORMATION: ?Date: 11/02/2021 ?Time: ?Location:[facility] ? ?

## 2021-10-06 ENCOUNTER — Other Ambulatory Visit: Payer: Self-pay | Admitting: Nurse Practitioner

## 2021-10-07 NOTE — Telephone Encounter (Signed)
Requested medication (s) are due for refill today - expired Rx ? ?Requested medication (s) are on the active medication list -no ? ?Future visit scheduled -yes ? ?Last refill: 2021 ? ?Notes to clinic: Request RF: Diabetic supplies not listed on current medication list ? ?Requested Prescriptions  ?Pending Prescriptions Disp Refills  ? Lancets (ONETOUCH DELICA PLUS [id number]) [place] [Pharmacy Med Name: Jonetta Speak PLUS 41D MIS] 622 each 0  ?  Sig: USE TO CHECK BLOOD SUGAR UP TO 4 TIMES DAILY  ?  ? Endocrinology: Diabetes - Testing Supplies Passed - 10/06/2021 10:56 AM  ?  ?  Passed - Valid encounter within last 12 months  ?  Recent Outpatient Visits   ? ?      ? 4 days ago Type 2 diabetes mellitus with obesity ([place])  ? Goodman, Barwick T, NP  ? 6 months ago Type 2 diabetes mellitus with obesity ([place])  ? [place], [place] T, NP  ? 1 year ago Type 2 diabetes mellitus with obesity ([place])  ? [place], [place] Lake T, NP  ? 1 year ago Type 2 diabetes mellitus with obesity ([place])  ? [place], NP  ? 1 year ago Benign hypertensive renal disease  ? [facility] [place], Henrine Screws T, NP  ? ?  ?  ?Future Appointments   ? ?        ? In 5 months Ralene Bathe, MD [place]  ? In 6 months Cannady, Barbaraann Faster, NP [employer]  ? ?  ? ?  ?  ?  ? ONETOUCH ULTRA test strip [employer] Med Name: OneTouch Ultra Blue In Vitro Strip] 100 each 0  ?  Sig: USE TO CHECK BLOOD SUGAR UP TO 4 TIMES DAILY AS DIRECTED  ?  ? Endocrinology: Diabetes - Testing Supplies Passed - 10/06/2021 10:56 AM  ?  ?  Passed - Valid encounter within last 12 months  ?  Recent Outpatient Visits   ? ?      ? 4 days ago Type 2 diabetes mellitus with obesity ([place])  ? [place]ail, Toledo T, NP  ? 6 months ago Type 2 diabetes mellitus with obesity ([place])  ? Wor[place], Denver T, NP  ? 1 year ago  Type 2 diabetes mellitus with obesity ([place])  ? [place], Frontier T, NP  ? 1 year ago Type 2 diabetes mellitus with obesity ([place])  ? [place], NP  ? 1 year ago Benign hypertensive renal disease  ? [facility] [place], Henrine Screws T, NP  ? ?  ?  ?Future Appointments   ? ?        ? In 5 months Ralene Bathe, MD [place]  ? In 6 months Cannady, Barbaraann Faster, NP [employer]  ? ?  ? ?  ?  ?  ? ? ? ?Requested Prescriptions  ?Pending Prescriptions Disp Refills  ? Lancets (ONETOUCH DELICA PLUS [id number]) [place] [Pharmacy Med Name: Jonetta Speak PLUS 94R MIS] 740 each 0  ?  Sig: USE TO CHECK BLOOD SUGAR UP TO 4 TIMES DAILY  ?  ? Endocrinology: Diabetes - Testing Supplies Passed - 10/06/2021 10:56 AM  ?  ?  Passed - Valid encounter within last 12 months  ?  Recent Outpatient Visits   ? ?      ?  4 days ago Type 2 diabetes mellitus with obesity ([place])  ? [place], Arnold T, NP  ? 6 months ago Type 2 diabetes mellitus with obesity ([place])  ? [place], [place]ings Lakes T, NP  ? 1 year ago Type 2 diabetes mellitus with obesity ([place])  ? [place], [place] T, NP  ? 1 year ago Type 2 diabetes mellitus with obesity ([place])  ? [place], NP  ? 1 year ago Benign hypertensive renal disease  ? [facility] [place], Henrine Screws T, NP  ? ?  ?  ?Future Appointments   ? ?        ? In 5 months Ralene Bathe, MD [place]  ? In 6 months Cannady, Barbaraann Faster, NP [employer]  ? ?  ? ?  ?  ?  ? ONETOUCH ULTRA test strip [employer] Med Name: OneTouch Ultra Blue In Vitro Strip] 100 each 0  ?  Sig: USE TO CHECK BLOOD SUGAR UP TO 4 TIMES DAILY AS DIRECTED  ?  ? Endocrinology: Diabetes - Testing Supplies Passed - 10/06/2021 10:56 AM  ?  ?  Passed - Valid encounter within last 12 months  ?  Recent Outpatient Visits   ? ?      ? 4 days  ago Type 2 diabetes mellitus with obesity ([place])  ? [place], [place] T, NP  ? 6 months ago Type 2 diabetes mellitus with obesity ([place])  ? [place], [place] T, NP  ? 1 year ago Type 2 diabetes mellitus with obesity ([place])  ? [place], Why T, NP  ? 1 year ago Type 2 diabetes mellitus with obesity ([place])  ? [place], NP  ? 1 year ago Benign hypertensive renal disease  ? [facility] [place], Henrine Screws T, NP  ? ?  ?  ?Future Appointments   ? ?        ? In 5 months Ralene Bathe, MD [place]  ? In 6 months Cannady, Barbaraann Faster, NP [employer]  ? ?  ? ?  ?  ?  ? ? ? ?

## 2021-10-11 ENCOUNTER — Other Ambulatory Visit: Payer: Self-pay

## 2021-10-11 ENCOUNTER — Ambulatory Visit
Admission: RE | Admit: 2021-10-11 | Discharge: 2021-10-11 | Disposition: A | Discharge status: Home / Self Care | Payer: 59 | Source: Ambulatory Visit | Attending: Nurse Practitioner | Admitting: Nurse Practitioner

## 2021-10-11 DIAGNOSIS — R7989 Other specified abnormal findings of blood chemistry: Secondary | ICD-10-CM | POA: Insufficient documentation

## 2021-10-12 NOTE — Progress Notes (Signed)
Contacted via [place] -- needs appointment with me in 4 weeks for liver follow-up ? ?Good morning Linda Boyle, your imaging returned and did note some fatty liver disease (hepatic steatosis).  This goes along with the elevation in liver function labs.  For this main focus is diet and weight management.  Eating healthier options for food and working on 30 minutes of exercise 5 days a week.  I would like you to return to see me in 4 weeks to recheck labs and check some other labs in office + further discuss this.  Any questions? ?Keep being amazing!!  Thank you for allowing me to participate in your care.  I appreciate you. ?Kindest regards, ?Surie Suchocki ?

## 2021-11-01 ENCOUNTER — Encounter: Payer: Self-pay | Admitting: Gastroenterology

## 2021-11-02 ENCOUNTER — Ambulatory Visit
Admission: RE | Admit: 2021-11-02 | Discharge: 2021-11-02 | Disposition: A | Discharge status: Home / Self Care | Payer: 59 | Attending: Gastroenterology | Admitting: Gastroenterology

## 2021-11-02 ENCOUNTER — Ambulatory Visit: Payer: 59 | Admitting: Anesthesiology

## 2021-11-02 ENCOUNTER — Encounter
Admission: RE | Disposition: A | Discharge status: Home / Self Care | Payer: Self-pay | Source: Home / Self Care | Attending: Gastroenterology

## 2021-11-02 ENCOUNTER — Encounter: Payer: Self-pay | Admitting: Gastroenterology

## 2021-11-02 DIAGNOSIS — D124 Benign neoplasm of descending colon: Secondary | ICD-10-CM | POA: Diagnosis not present

## 2021-11-02 DIAGNOSIS — D120 Benign neoplasm of cecum: Secondary | ICD-10-CM | POA: Diagnosis not present

## 2021-11-02 DIAGNOSIS — K635 Polyp of colon: Secondary | ICD-10-CM

## 2021-11-02 DIAGNOSIS — Z1211 Encounter for screening for malignant neoplasm of colon: Secondary | ICD-10-CM | POA: Diagnosis present

## 2021-11-02 DIAGNOSIS — K219 Gastro-esophageal reflux disease without esophagitis: Secondary | ICD-10-CM | POA: Diagnosis not present

## 2021-11-02 DIAGNOSIS — Z79899 Other long term (current) drug therapy: Secondary | ICD-10-CM | POA: Diagnosis not present

## 2021-11-02 DIAGNOSIS — E119 Type 2 diabetes mellitus without complications: Secondary | ICD-10-CM | POA: Insufficient documentation

## 2021-11-02 DIAGNOSIS — I10 Essential (primary) hypertension: Secondary | ICD-10-CM | POA: Diagnosis not present

## 2021-11-02 DIAGNOSIS — Z860100 Personal history of colon polyps, unspecified: Secondary | ICD-10-CM

## 2021-11-02 HISTORY — PX: COLONOSCOPY WITH PROPOFOL: SHX5780

## 2021-11-02 SURGERY — COLONOSCOPY WITH PROPOFOL
Anesthesia: General

## 2021-11-02 MED ORDER — STERILE WATER FOR IRRIGATION IR SOLN
Status: DC | PRN
  Administered 2021-11-02: 60 mL

## 2021-11-02 MED ORDER — DEXMEDETOMIDINE (PRECEDEX) IN NS 20 MCG/5ML (4 MCG/ML) IV SYRINGE
PREFILLED_SYRINGE | INTRAVENOUS | Status: DC | PRN
  Administered 2021-11-02: 8 ug via INTRAVENOUS

## 2021-11-02 MED ORDER — PROPOFOL 500 MG/50ML IV EMUL
INTRAVENOUS | Status: AC
  Filled 2021-11-02: qty 50

## 2021-11-02 MED ORDER — SODIUM CHLORIDE 0.9 % IV SOLN
INTRAVENOUS | Status: DC
  Administered 2021-11-02: 1000 mL via INTRAVENOUS

## 2021-11-02 MED ORDER — PROPOFOL 500 MG/50ML IV EMUL
INTRAVENOUS | Status: DC | PRN
  Administered 2021-11-02: 150 ug/kg/min via INTRAVENOUS

## 2021-11-02 MED ORDER — PROPOFOL 10 MG/ML IV BOLUS
INTRAVENOUS | Status: DC | PRN
  Administered 2021-11-02: 80 mg via INTRAVENOUS

## 2021-11-02 NOTE — Transfer of Care (Signed)
Immediate Anesthesia Transfer of Care Note ? ?Patient: Linda Boyle ? ?Procedure(s) Performed: Procedure(s): ?COLONOSCOPY WITH PROPOFOL (N/A) ? ?Patient Location: PACU and Endoscopy Unit ? ?Anesthesia Type:General ? ?Level of Consciousness: sedated ? ?Airway & Oxygen Therapy: Patient Spontanous Breathing and Patient connected to nasal cannula oxygen ? ?Post-op Assessment: Report given to RN and Post -op Vital signs reviewed and stable ? ?Post vital signs: Reviewed and stable ? ?Last Vitals:  ?Vitals:  ? 11/02/21 0814 11/02/21 0923  ?BP: (!) 152/102 103/63  ?Pulse: 79 77  ?Resp: 16 16  ?Temp: (!) 35.7 ?C   ?SpO2: 97% 100%  ? ? ?Complications: No apparent anesthesia complications ?

## 2021-11-02 NOTE — Op Note (Signed)
[facility] ?Gastroenterology ?Patient Name: Linda Boyle ?Procedure Date: 11/02/2021 8:50 AM ?MRN: [id number] ?Account #: [id number] ?Date of Birth: September 08, 1960 ?Admit Type: Outpatient ?Age: 61 ?Room: [facility] ENDO ROOM 4 ?Gender: Female ?Note Status: Finalized ?Instrument Name: Colonscope [id number] ?Procedure:             Colonoscopy ?Indications:           High risk colon cancer surveillance: Personal history  ?                       of colonic polyps, Last colonoscopy: February 2013,  ?                       Last colonoscopy 10 years ago ?Providers:             Lin Landsman MD, MD ?Referring MD:          Barbaraann Faster. Ned Card (Referring MD) ?Medicines:             General Anesthesia ?Complications:         No immediate complications. Estimated blood loss: None. ?Procedure:             Pre-Anesthesia Assessment: ?                       - Prior to the procedure, a History and Physical was  ?                       performed, and patient medications and allergies were  ?                       reviewed. The patient is competent. The risks and  ?                       benefits of the procedure and the sedation options and  ?                       risks were discussed with the patient. All questions  ?                       were answered and informed consent was obtained.  ?                       Patient identification and proposed procedure were  ?                       verified by the physician, the nurse, the  ?                       anesthesiologist, the anesthetist and the technician  ?                       in the pre-procedure area in the procedure room in the  ?                       endoscopy suite. Mental Status Examination: alert and  ?                       oriented. Airway Examination: normal oropharyngeal  ?  airway and neck mobility. Respiratory Examination:  ?                       clear to auscultation. CV Examination: normal.  ?                       Prophylactic  Antibiotics: The patient does not require  ?                       prophylactic antibiotics. Prior Anticoagulants: The  ?                       patient has taken no previous anticoagulant or  ?                       antiplatelet agents. ASA Grade Assessment: II - A  ?                       patient with mild systemic disease. After reviewing  ?                       the risks and benefits, the patient was deemed in  ?                       satisfactory condition to undergo the procedure. The  ?                       anesthesia plan was to use general anesthesia.  ?                       Immediately prior to administration of medications,  ?                       the patient was re-assessed for adequacy to receive  ?                       sedatives. The heart rate, respiratory rate, oxygen  ?                       saturations, blood pressure, adequacy of pulmonary  ?                       ventilation, and response to care were monitored  ?                       throughout the procedure. The physical status of the  ?                       patient was re-assessed after the procedure. ?                       After obtaining informed consent, the colonoscope was  ?                       passed under direct vision. Throughout the procedure,  ?                       the patient's blood pressure, pulse, and oxygen  ?  saturations were monitored continuously. The  ?                       Colonoscope was introduced through the anus and  ?                       advanced to the the terminal ileum, with  ?                       identification of the appendiceal orifice and IC  ?                       valve. The colonoscopy was performed without  ?                       difficulty. The patient tolerated the procedure well.  ?                       The quality of the bowel preparation was evaluated  ?                       using the BBPS [facility] Bowel Preparation Scale) with  ?                       scores of:  Right Colon = 3, Transverse Colon = 3 and  ?                       Left Colon = 3 (entire mucosa seen well with no  ?                       residual staining, small fragments of stool or opaque  ?                       liquid). The total BBPS score equals 9. ?Findings: ?     The perianal and digital rectal examinations were normal. Pertinent  ?     negatives include normal sphincter tone and no palpable rectal lesions. ?     Two sessile polyps were found in the descending colon and cecum. The  ?     polyps were 4 to 6 mm in size. These polyps were removed with a cold  ?     snare. Resection and retrieval were complete. ?     A 12 mm polypoid lesion was found at the anus. The lesion was polypoid  ?     and sessile. No bleeding was present. ?     The retroflexed view of the distal rectum and anal verge was normal and  ?     showed no anal or rectal abnormalities. ?     The terminal ileum appeared normal. ?Impression:            - Two 4 to 6 mm polyps in the descending colon and in  ?                       the cecum, removed with a cold snare. Resected and  ?                       retrieved. ?                       -  Likely benign polypoid lesion at the anus. ?                       - The distal rectum and anal verge are normal on  ?                       retroflexion view. ?                       - The examined portion of the ileum was normal. ?Recommendation:        - Discharge patient to home (with escort). ?                       - Resume previous diet today. ?                       - Continue present medications. ?                       - Await pathology results. ?                       - Repeat colonoscopy in 5 years for surveillance. ?                       - Refer to a surgeon at appointment to be scheduled  ?                       for removal of the lesion. ?Procedure Code(s):     --- Professional --- ?                       [phone], Colonoscopy, flexible; with removal of  ?                       tumor(s),  polyp(s), or other lesion(s) by snare  ?                       technique ?Diagnosis Code(s):     --- Professional --- ?                       Z86.010, Personal history of colonic polyps ?                       K63.5, Polyp of colon ?                       D49.0, Neoplasm of unspecified behavior of digestive  ?                       system ?CPT copyright 2019 American Medical Association. All rights reserved. ?The codes documented in this report are preliminary and upon coder review may  ?be revised to meet current compliance requirements. ?Dr. Ulyess Mort ?Lian Pounds Raeanne Gathers MD, MD ?11/02/2021 9:23:06 AM ?This report has been signed electronically. ?Number of Addenda: 0 ?Note Initiated On: 11/02/2021 8:50 AM ?Scope Withdrawal Time: 0 hours 8 minutes 54 seconds  ?Total Procedure Duration: 0 hours 13 minutes 2 seconds  ?Estimated Blood Loss:  Estimated blood loss: none. ?     [facility] ?

## 2021-11-02 NOTE — Anesthesia Procedure Notes (Signed)
Date/Time: 11/02/2021 9:04 AM ?Performed by: Doreen Salvage, CRNA ?Pre-anesthesia Checklist: Patient identified, Emergency Drugs available, Suction available and Patient being monitored ?Patient Re-evaluated:Patient Re-evaluated prior to induction ?Oxygen Delivery Method: Nasal cannula ?Induction Type: IV induction ?Dental Injury: Teeth and Oropharynx as per pre-operative assessment  ?Comments: Nasal cannula with etCO2 monitoring ? ? ? ? ?

## 2021-11-02 NOTE — Anesthesia Preprocedure Evaluation (Signed)
Anesthesia Evaluation  ?Patient identified by MRN, date of birth, ID band ?Patient awake ? ? ? ?Reviewed: ?Allergy & Precautions, NPO status , Patient's Chart, lab work & pertinent test results ? ?History of Anesthesia Complications ?Negative for: history of anesthetic complications ? ?Airway ?Mallampati: II ? ?TM Distance: >3 FB ?Neck ROM: Full ? ? ? Dental ?no notable dental hx. ?(+) Teeth Intact ?  ?Pulmonary ?neg pulmonary ROS, neg sleep apnea, neg COPD, Patient abstained from smoking.Not current smoker,  ?  ?Pulmonary exam normal ?breath sounds clear to auscultation ? ? ? ? ? ? Cardiovascular ?Exercise Tolerance: Good ?METShypertension, Pt. on medications ?(-) CAD and (-) Past MI (-) dysrhythmias  ?Rhythm:Regular Rate:Normal ?- Systolic murmurs ? ?  ?Neuro/Psych ?negative neurological ROS ? negative psych ROS  ? GI/Hepatic ?GERD  Controlled and Medicated,(+)  ?  ? (-) substance abuse ? ,   ?Endo/Other  ?diabetes ? Renal/GU ?negative Renal ROS  ? ?  ?Musculoskeletal ? ? Abdominal ?  ?Peds ? Hematology ?  ?Anesthesia Other Findings ?Past Medical History: ?No date: Allergy ?No date: Diabetes mellitus without complication ([place]) ?No date: GERD (gastroesophageal reflux disease) ?No date: Hyperlipidemia ?No date: Hypertension ?No date: Mitral valve prolapse ?No date: Neck sprain ?No date: Vertigo ? Reproductive/Obstetrics ? ?  ? ? ? ? ? ? ? ? ? ? ? ? ? ?  ?  ? ? ? ? ? ? ? ? ?Anesthesia Physical ?Anesthesia Plan ? ?ASA: 2 ? ?Anesthesia Plan: General  ? ?Post-op Pain Management: Minimal or no pain anticipated  ? ?Induction: Intravenous ? ?PONV Risk Score and Plan: 3 and Propofol infusion, TIVA and Ondansetron ? ?Airway Management Planned: Nasal Cannula ? ?Additional Equipment: None ? ?Intra-op Plan:  ? ?Post-operative Plan:  ? ?Informed Consent: I have reviewed the patients History and Physical, chart, labs and discussed the procedure including the risks, benefits and alternatives for  the proposed anesthesia with the patient or authorized representative who has indicated his/her understanding and acceptance.  ? ? ? ?Dental advisory given ? ?Plan Discussed with: CRNA and Surgeon ? ?Anesthesia Plan Comments: (Discussed risks of anesthesia with patient, including possibility of difficulty with spontaneous ventilation under anesthesia necessitating airway intervention, PONV, and rare risks such as cardiac or respiratory or neurological events, and allergic reactions. Discussed the role of CRNA in patient's perioperative care. Patient understands.)  ? ? ? ? ? ? ?Anesthesia Quick Evaluation ? ?

## 2021-11-02 NOTE — Anesthesia Postprocedure Evaluation (Signed)
Anesthesia Post Note ? ?Patient: Linda Boyle ? ?Procedure(s) Performed: COLONOSCOPY WITH PROPOFOL ? ?Patient location during evaluation: Endoscopy ?Anesthesia Type: General ?Level of consciousness: awake and alert ?Pain management: pain level controlled ?Vital Signs Assessment: post-procedure vital signs reviewed and stable ?Respiratory status: spontaneous breathing, nonlabored ventilation, respiratory function stable and patient connected to nasal cannula oxygen ?Cardiovascular status: blood pressure returned to baseline and stable ?Postop Assessment: no apparent nausea or vomiting ?Anesthetic complications: no ? ? ?No notable events documented. ? ? ?Last Vitals:  ?Vitals:  ? 11/02/21 0814 11/02/21 0923  ?BP: (!) 152/102 103/63  ?Pulse: 79 77  ?Resp: 16 16  ?Temp: (!) 35.7 ?C (!) 35.8 ?C  ?SpO2: 97% 100%  ?  ?Last Pain:  ?Vitals:  ? 11/02/21 0943  ?TempSrc:   ?PainSc: 0-No pain  ? ? ?  ?  ?  ?  ?  ?  ? ?Arita Miss ? ? ? ? ?

## 2021-11-02 NOTE — H&P (Signed)
?Cephas Darby, MD ?[address]  ?[address]  ?[place], [place] [address]  ?Main: [phone]  ?Fax: [phone] ?Pager: [phone] ? ?Primary Care Physician:  Venita Lick, NP ?Primary Gastroenterologist:  Dr. Cephas Darby ? ?Pre-Procedure History & Physical: ?HPI:  Linda Boyle is a 61 y.o. female is here for an colonoscopy. ?  ?Past Medical History:  ?Diagnosis Date  ? Allergy   ? Diabetes mellitus without complication ([place])   ? GERD (gastroesophageal reflux disease)   ? Hyperlipidemia   ? Hypertension   ? Mitral valve prolapse   ? Neck sprain   ? Vertigo   ? ? ?Past Surgical History:  ?Procedure Laterality Date  ? ABDOMINAL HYSTERECTOMY    ? partial  ? DILATION AND CURETTAGE OF UTERUS  [id number],5284  ? TOTAL VAGINAL HYSTERECTOMY  1994  ? Ovaries remain  ? TUBAL LIGATION    ? ? ?Prior to Admission medications   ?Medication Sig Start Date End Date Taking? Authorizing Provider  ?atorvastatin (LIPITOR) 40 MG tablet Take 1 tablet (40 mg total) by mouth daily. 10/03/21  Yes Cannady, Henrine Screws T, NP  ?blood glucose meter kit and supplies KIT Dispense based on patient and insurance preference. Use up to four times daily as directed. (FOR ICD-9 250.00, 250.01). 01/21/20  Yes Cannady, Barbaraann Faster, NP  ?Cholecalciferol (VITAMIN D-3) 1000 UNITS CAPS Take 1,000 Units by mouth daily.   Yes [provider]  ?Lancets (ONETOUCH DELICA PLUS [id number]) MISC USE TO CHECK BLOOD SUGAR UP TO 4 TIMES DAILY 10/07/21   Cannady, Henrine Screws T, NP  ?loratadine (CLARITIN) 10 MG tablet Take 10 mg by mouth daily.   Yes [provider]  ?omeprazole (PRILOSEC) 20 MG capsule Take 1 capsule (20 mg total) by mouth daily. 04/04/21  Yes Venita Lick, NP  ?ONETOUCH ULTRA test strip USE TO CHECK BLOOD SUGAR UP TO 4 TIMES DAILY AS DIRECTED 10/07/21  Yes Cannady, Jolene T, NP  ?verapamil (VERELAN PM) 180 MG 24 hr capsule Take 1 capsule (180 mg total) by mouth at bedtime. 04/04/21  Yes Cannady, Jolene T, NP  ?fluticasone (FLONASE)  50 MCG/ACT nasal spray Place 2 sprays into both nostrils daily. 04/04/21   Marnee Guarneri T, NP  ?meclizine (ANTIVERT) 25 MG tablet Take 1 tablet (25 mg total) by mouth 3 (three) times daily as needed for dizziness. 01/30/19   Venita Lick, NP  ? ? ?Allergies as of 10/05/2021 - Review Complete 10/05/2021  ?Allergen Reaction Noted  ? Lisinopril  02/21/2019  ? Losartan Cough 10/24/2019  ? ? ?Family History  ?Problem Relation Age of Onset  ? Diabetes Mother   ? Alzheimer's disease Father   ? Hypertension Father   ? Diabetes Sister   ? Hypertension Brother   ? Asthma Brother   ? Diabetes Brother   ? Hypertension Sister   ? Breast cancer Neg Hx   ? ? ?Social History  ? ?Socioeconomic History  ? Marital status: Widowed  ?  Spouse name: Not on file  ? Number of children: Not on file  ? Years of education: Not on file  ? Highest education level: Not on file  ?Occupational History  ? Not on file  ?Tobacco Use  ? Smoking status: Never  ? Smokeless tobacco: Never  ?Vaping Use  ? Vaping Use: Never used  ?Substance and Sexual Activity  ? Alcohol use: No  ? Drug use: No  ? Sexual activity: Not Currently  ?Other Topics Concern  ? Not  on file  ?Social History Narrative  ? Not on file  ? ?Social Determinants of Health  ? ?Financial Resource Strain: Low Risk   ? Difficulty of Paying Living Expenses: Not hard at all  ?Food Insecurity: No Food Insecurity  ? Worried About Charity fundraiser in the Last Year: Never true  ? Ran Out of Food in the Last Year: Never true  ?Transportation Needs: No Transportation Needs  ? Lack of Transportation (Medical): No  ? Lack of Transportation (Non-Medical): No  ?Physical Activity: Insufficiently Active  ? Days of Exercise per Week: 4 days  ? Minutes of Exercise per Session: 30 min  ?Stress: No Stress Concern Present  ? Feeling of Stress : Not at all  ?Social Connections: Moderately Isolated  ? Frequency of Communication with Friends and Family: More than three times a week  ? Frequency of Social  Gatherings with Friends and Family: More than three times a week  ? Attends Religious Services: More than 4 times per year  ? Active Member of Clubs or Organizations: No  ? Attends Archivist Meetings: Never  ? Marital Status: Widowed  ?Intimate Partner Violence: Not At Risk  ? Fear of Current or Ex-Partner: No  ? Emotionally Abused: No  ? Physically Abused: No  ? Sexually Abused: No  ? ? ?Review of Systems: ?See HPI, otherwise negative ROS ? ?Physical Exam: ?BP (!) 152/102   Pulse 79   Temp (!) 96.3 ?F (35.7 ?C) (Temporal)   Resp 16   Ht 5' 1"  (1.549 m)   Wt 70 kg   LMP 10/14/1992 (Approximate)   SpO2 97%   BMI 29.16 kg/m?  ?General:   Alert,  pleasant and cooperative in NAD ?Head:  Normocephalic and atraumatic. ?Neck:  Supple; no masses or thyromegaly. ?Lungs:  Clear throughout to auscultation.    ?Heart:  Regular rate and rhythm. ?Abdomen:  Soft, nontender and nondistended. Normal bowel sounds, without guarding, and without rebound.   ?Neurologic:  Alert and  oriented x4;  grossly normal neurologically. ? ?Impression/Plan: ?Linda Boyle is here for an colonoscopy to be performed for h/o colon polyps ? ?Risks, benefits, limitations, and alternatives regarding  colonoscopy have been reviewed with the patient.  Questions have been answered.  All parties agreeable. ? ? ?Sherri Sear, MD  11/02/2021, 8:59 AM ?

## 2021-11-03 ENCOUNTER — Encounter: Payer: Self-pay | Admitting: Gastroenterology

## 2021-11-03 LAB — SURGICAL PATHOLOGY

## 2021-11-04 ENCOUNTER — Encounter: Payer: Self-pay | Admitting: Gastroenterology

## 2021-11-07 ENCOUNTER — Telehealth: Payer: Self-pay

## 2021-11-07 DIAGNOSIS — K629 Disease of anus and rectum, unspecified: Secondary | ICD-10-CM

## 2021-11-07 NOTE — Telephone Encounter (Signed)
-----   Message from Lin Landsman, MD sent at 11/04/2021 11:25 AM EDT ----- ?Recommend referral to general surgery for removal of perianal lesion.  I discussed with patient after the procedure and she agreed ? ?RV ?

## 2021-11-07 NOTE — Telephone Encounter (Signed)
Placed referral  

## 2021-11-12 DIAGNOSIS — K760 Fatty (change of) liver, not elsewhere classified: Secondary | ICD-10-CM | POA: Insufficient documentation

## 2021-11-12 NOTE — Patient Instructions (Signed)
Nonalcoholic Fatty Liver Disease Diet, Adult Nonalcoholic fatty liver disease is a condition that causes fat to build up in and around the liver. The disease makes it harder for the liver to work the way that it should. Following a healthy diet can help to keep nonalcoholic fatty liver disease under control. It can also help to prevent or improve conditions that are associated with the disease, such as heart disease, diabetes, high blood pressure, and abnormal cholesterol levels. Along with regular exercise, this diet: Promotes weight loss. Helps to control blood sugar levels. Helps to improve the way that the body uses insulin. What are tips for following this plan? Reading food labels Always check food labels for: The amount of saturated fat in a food. You should limit your intake of saturated fat. Saturated fat is found in foods that come from animals, including meat and dairy products such as butter, cheese, and whole milk. The amount of fiber in a food. You should choose high-fiber foods such as fruits, vegetables, and whole grains. Try to get 25-30 grams (g) of fiber a day.  Cooking When cooking, use heart-healthy oils that are high in monounsaturated fats. These include olive oil, canola oil, and avocado oil. Limit frying or deep-frying foods. Cook foods using healthy methods such as baking, boiling, steaming, and grilling instead. Meal planning You may want to keep track of how many calories you take in. Eating the right amount of calories will help you achieve a healthy weight. Meeting with a registered dietitian can help you get started. Limit how often you eat takeout and fast food. These foods are usually very high in fat, salt, and sugar. Use the glycemic index (GI) to plan your meals. The index tells you how quickly a food will raise your blood sugar. Choose low-GI foods (GI less than 55). These foods take a longer time to raise blood sugar. A registered dietitian can help you  identify foods lower on the GI scale. Lifestyle You may want to follow a Mediterranean diet. This diet includes a lot of vegetables, lean meats or fish, whole grains, fruits, and healthy oils and fats. What foods can I eat?  Fruits Bananas. Apples. Oranges. Grapes. Papaya. Mango. Pomegranate. Kiwi. Grapefruit. Cherries. Vegetables Lettuce. Spinach. Peas. Beets. Cauliflower. Cabbage. Broccoli. Carrots. Tomatoes. Squash. Eggplant. Herbs. Peppers. Onions. Cucumbers. Brussels sprouts. Yams and sweet potatoes. Beans. Lentils. Grains Whole wheat or whole-grain foods, including breads, crackers, cereals, and pasta. Stone-ground whole wheat. Unsweetened oatmeal. Bulgur. Barley. Quinoa. Brown or wild rice. Corn or whole wheat flour tortillas. Meats and other proteins Lean meats. Poultry. Tofu. Seafood and shellfish. Dairy Low-fat or fat-free dairy products, such as yogurt, cottage cheese, or cheese. Beverages Water. Sugar-free drinks. Tea. Coffee. Low-fat or skim milk. Milk alternatives, such as soy or almond milk. Real fruit juice. Fats and oils Avocado. Canola or olive oil. Nuts and nut butters. Seeds. Seasonings and condiments Mustard. Relish. Low-fat, low-sugar ketchup and barbecue sauce. Low-fat or fat-free mayonnaise. Sweets and desserts Sugar-free sweets. The items listed above may not be a complete list of foods and beverages you can eat. Contact a dietitian for more information. What foods should I limit or avoid? Meats and other proteins Limit red meat to 1-2 times a week. Dairy Full-fat dairy. Fats and oils Palm oil and coconut oil. Fried foods. Other foods Processed foods. Foods that contain a lot of salt or sodium. Sweets and desserts Sweets that contain sugar. Beverages Sweetened drinks, such as sweet tea, milkshakes, iced sweet   drinks, and sodas. Alcohol. The items listed above may not be a complete list of foods and beverages you should avoid. Contact a dietitian for more  information. Where to find more information The National Institute of Diabetes and Digestive and Kidney Diseases: niddk.nih.gov Summary Nonalcoholic fatty liver disease is a condition that causes fat to build up in and around the liver. Following a healthy diet can help to keep nonalcoholic fatty liver disease under control. Your diet should be rich in fruits, vegetables, whole grains, and lean proteins. Limit your intake of saturated fat. Saturated fat is found in foods that come from animals, including meat and dairy products such as butter, cheese, and whole milk. This diet promotes weight loss, helps to control blood sugar levels, and helps to improve the way that the body uses insulin. This information is not intended to replace advice given to you by your health care provider. Make sure you discuss any questions you have with your health care provider. Document Revised: 10/18/2018 Document Reviewed: 07/18/2018 Elsevier Patient Education  2023 Elsevier Inc.  

## 2021-11-14 ENCOUNTER — Ambulatory Visit (INDEPENDENT_AMBULATORY_CARE_PROVIDER_SITE_OTHER): Payer: 59 | Admitting: Nurse Practitioner

## 2021-11-14 ENCOUNTER — Encounter: Payer: Self-pay | Admitting: Nurse Practitioner

## 2021-11-14 VITALS — BP 121/80 | HR 73 | Temp 98.0°F | Wt 157.4 lb

## 2021-11-14 DIAGNOSIS — R7989 Other specified abnormal findings of blood chemistry: Secondary | ICD-10-CM

## 2021-11-14 DIAGNOSIS — K760 Fatty (change of) liver, not elsewhere classified: Secondary | ICD-10-CM | POA: Diagnosis not present

## 2021-11-14 NOTE — Assessment & Plan Note (Signed)
Noted on imaging 10/11/21 with some trend up in LFTs.  Will recheck labs today including Hepatitis labs and ANA, discussed with patient.  Recommend heavy focus on diet changes, which she reports she had been reading about online.  If worsening or ongoing trend up LFTs then will consider GI referral.  Diabetes currently well-controlled. ?

## 2021-11-14 NOTE — Progress Notes (Signed)
? ?BP 121/80   Pulse 73   Temp 98 ?F (36.7 ?C) (Oral)   Wt 157 lb 6.4 oz (71.4 kg)   LMP 10/14/1992 (Approximate)   SpO2 99%   BMI 29.74 kg/m?   ? ?Subjective:  ? ? Patient ID: Linda Boyle, female    DOB: [birth year], 61 y.o.   MRN: [id number] ? ?HPI: ?Linda Boyle is a 61 y.o. female ? ?Chief Complaint  ?Patient presents with  ? Elevated Hepatic Enzymes  ?  Patient is here for a follow up on her liver. Patient denies having any concerns at today's visit.   ? ?HEPATIC STEATOSIS ?Noted on imaging 10/11/21 with recent LFTs trending up AST/ALT 79/107. Does endorse enjoying white bread and bagels. Rarely takes Tylenol and does not drink alcohol often, not a heavy alcohol user. ?Duration:months ?Fever: no ?Nausea: no ?Vomiting: no ?Weight loss: no ?Decreased appetite: no ?Diarrhea: no ?Constipation:  occasional -- eats prunes to help ?Blood in stool: no ?Heartburn: no ?Jaundice: no ?Rash: no ?Dysuria/urinary frequency: no ?Hematuria: no ?History of sexually transmitted disease: no ?Recurrent NSAID use: no  ? ? ?  Latest Ref Rng & Units 10/03/2021  ? 10:01 AM 04/04/2021  ?  9:26 AM 09/30/2020  ?  9:40 AM  ?Hepatic Function  ?Total Protein 6.0 - 8.5 g/dL 7.5   7.0   7.0    ?Albumin 3.8 - 4.8 g/dL 4.1   4.2   4.1    ?AST 0 - 40 IU/L 79   54   53    ?ALT 0 - 32 IU/L 107   67   71    ?Alk Phosphatase 44 - 121 IU/L 77   77   78    ?Total Bilirubin 0.0 - 1.2 mg/dL 0.5   0.4   0.7    ?  ? ?Relevant past medical, surgical, family and social history reviewed and updated as indicated. Interim medical history since our last visit reviewed. ?Allergies and medications reviewed and updated. ? ?Review of Systems  ?Constitutional: Negative.  Negative for activity change, appetite change, fatigue and fever.  ?HENT: Negative.    ?Respiratory: Negative.    ?Cardiovascular: Negative.   ?Gastrointestinal: Negative.   ?Genitourinary: Negative.   ?Neurological: Negative.   ?Psychiatric/Behavioral: Negative.    ? ?Per HPI unless  specifically indicated above ? ?   ?Objective:  ?  ?BP 121/80   Pulse 73   Temp 98 ?F (36.7 ?C) (Oral)   Wt 157 lb 6.4 oz (71.4 kg)   LMP 10/14/1992 (Approximate)   SpO2 99%   BMI 29.74 kg/m?   ?Wt Readings from Last 3 Encounters:  ?11/14/21 157 lb 6.4 oz (71.4 kg)  ?11/02/21 154 lb 5.2 oz (70 kg)  ?10/03/21 156 lb 12.8 oz (71.1 kg)  ?  ?Physical Exam ?Vitals and nursing note reviewed.  ?Constitutional:   ?   General: She is not in acute distress. ?   Appearance: Normal appearance. She is obese. She is not toxic-appearing.  ?HENT:  ?   Head: Normocephalic and atraumatic.  ?   Right Ear: External ear normal.  ?   Left Ear: External ear normal.  ?Eyes:  ?   General: No scleral icterus. ?   Extraocular Movements: Extraocular movements intact.  ?Neck:  ?   Vascular: No carotid bruit.  ?Cardiovascular:  ?   Rate and Rhythm: Normal rate and regular rhythm.  ?   Heart sounds: Normal heart sounds. No murmur heard. ?Pulmonary:  ?  Effort: Pulmonary effort is normal. No respiratory distress.  ?   Breath sounds: Normal breath sounds. No wheezing, rhonchi or rales.  ?Abdominal:  ?   General: Abdomen is flat. Bowel sounds are normal.  ?   Palpations: Abdomen is soft.  ?   Tenderness: There is no abdominal tenderness.  ?Musculoskeletal:     ?   General: No swelling or tenderness. Normal range of motion.  ?   Cervical back: Normal range of motion.  ?Skin: ?   General: Skin is warm and dry.  ?   Capillary Refill: Capillary refill takes less than 2 seconds.  ?Neurological:  ?   General: No focal deficit present.  ?   Mental Status: She is alert and oriented to person, place, and time.  ?   Motor: No weakness.  ?   Gait: Gait normal.  ?Psychiatric:     ?   Mood and Affect: Mood normal.     ?   Behavior: Behavior normal.     ?   Thought Content: Thought content normal.     ?   Judgment: Judgment normal.  ? ? ?Results for orders placed or performed during the hospital encounter of 11/02/21  ?Surgical pathology  ?Result Value Ref  Range  ? SURGICAL PATHOLOGY    ?  SURGICAL PATHOLOGY ?CASE: [id number] ?PATIENT: Linda Boyle ?Surgical Pathology Report ? ? ? ? ?Specimen Submitted: ?A. Colon polyp, cecum; cold snare ?B. Colon polyp, descending; cold snare ? ?Clinical History: History of colonic Z86.010.  Polyps, anal lesion ? ? ? ? ? ?DIAGNOSIS: ?A.  COLON POLYP, CECUM; COLD SNARE: ?- COLONIC MUCOSA WITH PROMINENT LYMPHOID AGGREGATE. ?- NEGATIVE FOR DYSPLASIA AND MALIGNANCY. ?- DEEPER SECTIONS EXAMINED. ? ?B.  COLON POLYP, DESCENDING; COLD SNARE: ?- TUBULAR ADENOMA. ?- NEGATIVE FOR HIGH-GRADE DYSPLASIA AND MALIGNANCY ? ?GROSS DESCRIPTION: ?A. Labeled: Cecum polyp cold snare ?Received: Formalin ?Collection time: 9:11 AM on 11/02/2021 ?Placed into formalin time: 9:11 AM on 11/02/2021 ?Tissue fragment(s): Multiple ?Size: Aggregate, 0.9 x 0.5 x 0.1 cm ?Description: Tan soft tissue fragments ?Entirely submitted in 1 cassette. ? ?B. Labeled: Descending colon polyp cold snare ?Received: Formalin ?Collection time: 9:15 AM on 11/02/2021 ?Placed into forma lin time: 9:15 AM on 11/02/2021 ?Tissue fragment(s): Multiple ?Size: Aggregate, 1.4 x 0.3 x 0.1 cm ?Description: Tan soft tissue fragments ?Entirely submitted in 1 cassette. ? ?CM 11/02/2021 ? ?Final Diagnosis performed by Quay Burow, MD.   Electronically signed ?11/03/2021 2:58:02PM ?The electronic signature indicates that the named Attending Pathologist ?has evaluated the specimen ?Technical component performed at [facility], [address] [place], ?[place] [address] Lab: [phone] Dir: Rush Farmer, MD, MMM ? Professional component performed at [facility], [facility], [place], [place], [place] [address] Lab: [phone] ?Dir: Kathi Simpers, MD ?  ? ?   ?Assessment & Plan:  ? ?Problem List Items Addressed This Visit   ? ?  ? Digestive  ? Hepatic steatosis - Primary  ?  Noted on imaging 10/11/21 with some trend up in LFTs.  Will recheck labs today including Hepatitis labs  and ANA, discussed with patient.  Recommend heavy focus on diet changes, which she reports she had been reading about online.  If worsening or ongoing trend up LFTs then will consider GI referral.  Diabetes currently well-controlled. ? ?  ?  ? Relevant Orders  ? Hepatitis C antibody  ? Comprehensive metabolic panel  ? Hepatitis B surface antigen  ? Hepatitis A antibody, total  ? ANA  w/Reflex if Positive  ? C-reactive protein  ? Sed Rate (ESR)  ? Gamma GT  ?  ? Other  ? Elevated LFTs  ?  Refer to hepatic steatosis plan of care. ? ?  ?  ? Relevant Orders  ? Hepatitis C antibody  ? Comprehensive metabolic panel  ? Hepatitis B surface antigen  ? Hepatitis A antibody, total  ? ANA w/Reflex if Positive  ? C-reactive protein  ? Sed Rate (ESR)  ? Gamma GT  ?  ? ?Follow up plan: ?Return for as scheduled in September. ? ? ? ? ? ?

## 2021-11-14 NOTE — Assessment & Plan Note (Signed)
Refer to hepatic steatosis plan of care. 

## 2021-11-15 LAB — COMPREHENSIVE METABOLIC PANEL
ALT: 122 IU/L — ABNORMAL HIGH (ref 0–32)
AST: 101 IU/L — ABNORMAL HIGH (ref 0–40)
Albumin/Globulin Ratio: 1.5 (ref 1.2–2.2)
Albumin: 4.1 g/dL (ref 3.8–4.8)
Alkaline Phosphatase: 85 IU/L (ref 44–121)
BUN/Creatinine Ratio: 11 — ABNORMAL LOW (ref 12–28)
BUN: 10 mg/dL (ref 8–27)
Bilirubin Total: 0.4 mg/dL (ref 0.0–1.2)
CO2: 24 mmol/L (ref 20–29)
Calcium: 8.9 mg/dL (ref 8.7–10.3)
Chloride: 106 mmol/L (ref 96–106)
Creatinine, Ser: 0.9 mg/dL (ref 0.57–1.00)
Globulin, Total: 2.8 g/dL (ref 1.5–4.5)
Glucose: 99 mg/dL (ref 70–99)
Potassium: 3.6 mmol/L (ref 3.5–5.2)
Sodium: 143 mmol/L (ref 134–144)
Total Protein: 6.9 g/dL (ref 6.0–8.5)
eGFR: 73 mL/min/{1.73_m2} (ref >59)

## 2021-11-15 LAB — HEPATITIS B SURFACE ANTIGEN: Hepatitis B Surface Ag: NEGATIVE

## 2021-11-15 LAB — GAMMA GT: GGT: 34 IU/L (ref 0–60)

## 2021-11-15 LAB — HEPATITIS C ANTIBODY: Hep C Virus Ab: NONREACTIVE

## 2021-11-15 LAB — C-REACTIVE PROTEIN: CRP: 5 mg/L (ref 0–10)

## 2021-11-15 LAB — SEDIMENTATION RATE: Sed Rate: 42 mm/hr — ABNORMAL HIGH (ref 0–40)

## 2021-11-15 LAB — HEPATITIS A ANTIBODY, TOTAL: hep A Total Ab: POSITIVE — AB

## 2021-11-15 LAB — ANA W/REFLEX IF POSITIVE: Anti Nuclear Antibody (ANA): NEGATIVE

## 2021-11-15 NOTE — Progress Notes (Signed)
Contacted via [place] ? ? ?Good evening Linda Boyle, your labs have returned: ?- Liver function testing, AST and ALT, continues to trend up.  Hep B and Hep C is negative, but Hep A antibody is positive (which means you may have had this infection in past or recently).  This type of hepatitis is passed via contaminated food or water or contact with someone who has the infection -- it is not sexually transmitted or blood borne like Hep B and Hep C.  Have you had any recent fatigue, nausea, loss of appetite, or fever recently? ?- Inflammatory labs are negative -- ESR, CRP, and ANA.  Plus GGT is normal, looking at gall bladder.  Let me know if you have been sick recently, this will help guide me on next directions.  If you have not been sick I may get you into GI for further assessment.  Any questions? ?Keep being stellar!!  Thank you for allowing me to participate in your care.  I appreciate you. ?Kindest regards, ?Kirsten Mckone ?

## 2021-11-18 ENCOUNTER — Other Ambulatory Visit: Payer: Self-pay | Admitting: Nurse Practitioner

## 2021-11-18 ENCOUNTER — Telehealth: Payer: Self-pay | Admitting: Nurse Practitioner

## 2021-11-18 ENCOUNTER — Encounter: Payer: Self-pay | Admitting: Nurse Practitioner

## 2021-11-18 DIAGNOSIS — R7989 Other specified abnormal findings of blood chemistry: Secondary | ICD-10-CM

## 2021-11-18 DIAGNOSIS — Z1231 Encounter for screening mammogram for malignant neoplasm of breast: Secondary | ICD-10-CM

## 2021-11-18 NOTE — Telephone Encounter (Signed)
Copied from [place] [id number]. Topic: General - Other ?>> Nov 18, 2021  9:16 AM Leward Quan A wrote: ?Reason for CRM: Patient called in to inquire of Jolene Cannady now that her test results are in and say what it say what will the next step be. Asking for a call back ASAP at  Ph# [phone] ?

## 2021-11-18 NOTE — Telephone Encounter (Signed)
Returned patient call to ask had she been experiencing any of the symptoms listed in providers result note. Patient states only symptom she's had is fatigue. Patient also wanted to know should her family be tested.  ?

## 2021-11-21 ENCOUNTER — Other Ambulatory Visit: Payer: 59

## 2021-11-21 DIAGNOSIS — R7989 Other specified abnormal findings of blood chemistry: Secondary | ICD-10-CM

## 2021-11-22 ENCOUNTER — Other Ambulatory Visit: Payer: Self-pay | Admitting: Nurse Practitioner

## 2021-11-22 DIAGNOSIS — R7989 Other specified abnormal findings of blood chemistry: Secondary | ICD-10-CM

## 2021-11-22 NOTE — Progress Notes (Signed)
Contacted via MyChart -- needs 4 week lab only visit please ? ? ?Good evening Oretha, your labs (with exception of a couple celiac labs) have returned: ?- Your liver function, AST and ALT, remain elevated, but they are trending down now.  This makes me wonder if Hep A infection had been recent.  I would like to recheck these in 4 weeks outpatient to see if they continue to trend down.  This is good news today. ?- CK, which look at muscle, is a little elevated, this could be related to some irritation to liver too.  We will recheck next on labs visit. ?- Remainder of labs are normal at this time, will alert you when remainder come.  Any questions? ?Keep being amazing!!  Thank you for allowing me to participate in your care.  I appreciate you. ?Kindest regards, ?Acquanetta Cabanilla ?

## 2021-11-23 ENCOUNTER — Encounter: Payer: Self-pay | Admitting: Nurse Practitioner

## 2021-11-23 LAB — CELIAC DISEASE AB SCREEN W/RFX
Antigliadin Abs, IgA: 6 units (ref 0–19)
IgA/Immunoglobulin A, Serum: 156 mg/dL (ref 87–352)
Transglutaminase IgA: <2 U/mL (ref 0–3)

## 2021-11-23 LAB — IRON,TIBC AND FERRITIN PANEL
Ferritin: 147 ng/mL (ref 15–150)
Iron Saturation: 33 % (ref 15–55)
Iron: 104 ug/dL (ref 27–139)
Total Iron Binding Capacity: 317 ug/dL (ref 250–450)
UIBC: 213 ug/dL (ref 118–369)

## 2021-11-23 LAB — HEPATIC FUNCTION PANEL
ALT: 106 IU/L — ABNORMAL HIGH (ref 0–32)
AST: 92 IU/L — ABNORMAL HIGH (ref 0–40)
Albumin: 4 g/dL (ref 3.8–4.8)
Alkaline Phosphatase: 85 IU/L (ref 44–121)
Bilirubin Total: 0.4 mg/dL (ref 0.0–1.2)
Bilirubin, Direct: 0.11 mg/dL (ref 0.00–0.40)
Total Protein: 7.2 g/dL (ref 6.0–8.5)

## 2021-11-23 LAB — CK: Total CK: 186 U/L — ABNORMAL HIGH (ref 32–182)

## 2021-11-23 LAB — ALPHA-1-ANTITRYPSIN: A-1 Antitrypsin: 151 mg/dL (ref 101–187)

## 2021-11-23 NOTE — Progress Notes (Signed)
Contacted via [place] ? ? ?Celiac labs are normal.

## 2021-12-02 ENCOUNTER — Ambulatory Visit: Payer: 59 | Admitting: Surgery

## 2021-12-02 ENCOUNTER — Encounter: Payer: Self-pay | Admitting: Surgery

## 2021-12-02 VITALS — BP 131/84 | HR 71 | Temp 98.0°F | Ht 61.0 in | Wt 158.0 lb

## 2021-12-02 DIAGNOSIS — K648 Other hemorrhoids: Secondary | ICD-10-CM | POA: Diagnosis not present

## 2021-12-02 DIAGNOSIS — K620 Anal polyp: Secondary | ICD-10-CM

## 2021-12-02 NOTE — H&P (View-Only) (Signed)
12/02/2021  Reason for Visit:  Anal polyp  Requesting Provider:  Sherri Sear, MD  History of Present Illness: Linda Boyle is a 61 y.o. female presenting for evaluation of an anal polyp.  The patient underwent a colonoscopy on 4/26 with Dr. Marius Ditch for screening purposes.  In the colonoscopy, she was found to have two polyps in the descending colon and cecum, which were resected.  Pathology showed a tubular adenoma in the descending colon, overall negative for high grade dysplasia or malignancy.  She was also found to have a 12 mm polyp in the anus which could not be resected based on location.  The patient reports that she's doing well after her colonoscopy.  Denies any blood in her stool or perianal pain.  She does report that sometimes she can feel something protruding from the anal canal when she has bowel movements.  Denies any issues with hemorrhoids in the past.   Of note, the patient currently is getting worked up by her PCP for elevated AST/ALT.  Workup so far has shown her to be positive for Hepatitis A antibody.  Her more recent AST/ALT was starting to trend down.  Past Medical History: Past Medical History:  Diagnosis Date   Allergy    Diabetes mellitus without complication (HCC)    GERD (gastroesophageal reflux disease)    Hyperlipidemia    Hypertension    Mitral valve prolapse    Neck sprain    Vertigo      Past Surgical History: Past Surgical History:  Procedure Laterality Date   ABDOMINAL HYSTERECTOMY     partial   COLONOSCOPY WITH PROPOFOL N/A 11/02/2021   Procedure: COLONOSCOPY WITH PROPOFOL;  Surgeon: Lin Landsman, MD;  Location: [facility] ENDOSCOPY;  Service: Gastroenterology;  Laterality: N/A;   DILATION AND CURETTAGE OF UTERUS  2229,7989   TOTAL VAGINAL HYSTERECTOMY  1994   Ovaries remain   TUBAL LIGATION      Home Medications: Prior to Admission medications   Medication Sig Start Date End Date Taking? Authorizing Provider  atorvastatin (LIPITOR) 40  MG tablet Take 1 tablet (40 mg total) by mouth daily. 10/03/21  Yes Cannady, Jolene T, NP  blood glucose meter kit and supplies KIT Dispense based on patient and insurance preference. Use up to four times daily as directed. (FOR ICD-9 250.00, 250.01). 01/21/20  Yes Cannady, Jolene T, NP  Cholecalciferol (VITAMIN D-3) 1000 UNITS CAPS Take 1,000 Units by mouth daily.   Yes [provider]  fluticasone (FLONASE) 50 MCG/ACT nasal spray Place 2 sprays into both nostrils daily. 04/04/21  Yes Cannady, Jolene T, NP  Lancets (ONETOUCH DELICA PLUS [id number]) MISC USE TO CHECK BLOOD SUGAR UP TO 4 TIMES DAILY 10/07/21  Yes Cannady, Jolene T, NP  loratadine (CLARITIN) 10 MG tablet Take 10 mg by mouth daily.   Yes [provider]  meclizine (ANTIVERT) 25 MG tablet Take 1 tablet (25 mg total) by mouth 3 (three) times daily as needed for dizziness. 01/30/19  Yes Cannady, Jolene T, NP  omeprazole (PRILOSEC) 20 MG capsule Take 1 capsule (20 mg total) by mouth daily. 04/04/21  Yes Cannady, Jolene T, NP  ONETOUCH ULTRA test strip USE TO CHECK BLOOD SUGAR UP TO 4 TIMES DAILY AS DIRECTED 10/07/21  Yes Cannady, Jolene T, NP  verapamil (VERELAN PM) 180 MG 24 hr capsule Take 1 capsule (180 mg total) by mouth at bedtime. 04/04/21  Yes Venita Lick, NP    Allergies: Allergies  Allergen Reactions  Lisinopril     cough   Losartan Cough    Social History:  reports that she has never smoked. She has never been exposed to tobacco smoke. She has never used smokeless tobacco. She reports that she does not drink alcohol and does not use drugs.   Family History: Family History  Problem Relation Age of Onset   Diabetes Mother    Alzheimer's disease Father    Hypertension Father    Diabetes Sister    Hypertension Brother    Asthma Brother    Diabetes Brother    Hypertension Sister    Breast cancer Neg Hx     Review of Systems: Review of Systems  Constitutional:  Negative for chills and fever.   HENT:  Negative for hearing loss.   Respiratory:  Negative for shortness of breath.   Cardiovascular:  Negative for chest pain.  Gastrointestinal:  Negative for abdominal pain, blood in stool, constipation, nausea and vomiting.  Genitourinary:  Negative for dysuria.  Musculoskeletal:  Negative for myalgias.  Skin:  Negative for rash.  Neurological:  Negative for dizziness.  Psychiatric/Behavioral:  Negative for depression.    Physical Exam BP 131/84   Pulse 71   Temp 98 F (36.7 C)   Ht _0  (1.549 m)   Wt 158 lb (71.7 kg)   LMP 10/14/1992 (Approximate)   SpO2 97%   BMI 29.85 kg/m  CONSTITUTIONAL: No acute distress, well nourished. HEENT:  Normocephalic, atraumatic, extraocular motion intact. NECK: Trachea is midline, and there is no jugular venous distension.  RESPIRATORY:  Lungs are clear, and breath sounds are equal bilaterally. Normal respiratory effort without pathologic use of accessory muscles. CARDIOVASCULAR: Heart is regular without murmurs, gallops, or rubs. RECTAL:  External exam reveals minimally enlarged external hemorrhoid tissue, without any inflammation or induration or tenderness.  On digital rectal exam, it was difficult to feel a discrete lesion or mass, so anoscopy was performed.  This showed an enlarged internal right posterior hemorrhoid tissue, and coming from it was a 1 cm mass as seen on colonoscopy consistent with polyp vs enlarged papilla.  No gross blood but after removing anoscope, can see the mass protruding beyond the anal verge, consistent with the patient's report.   MUSCULOSKELETAL:  Normal muscle strength and tone in all four extremities.  No peripheral edema or cyanosis. SKIN: Skin turgor is normal. There are no pathologic skin lesions.  NEUROLOGIC:  Motor and sensation is grossly normal.  Cranial nerves are grossly intact. PSYCH:  Alert and oriented to person, place and time. Affect is normal.  Laboratory Analysis: Pathology  11/02/21: DIAGNOSIS:  A.  COLON POLYP, CECUM; COLD SNARE:  - COLONIC MUCOSA WITH PROMINENT LYMPHOID AGGREGATE.  - NEGATIVE FOR DYSPLASIA AND MALIGNANCY.  - DEEPER SECTIONS EXAMINED.   B.  COLON POLYP, DESCENDING; COLD SNARE:  - TUBULAR ADENOMA.  - NEGATIVE FOR HIGH-GRADE DYSPLASIA AND MALIGNANCY   Imaging: Colonoscopy 11/02/21: IMPRESSION: - Two 4 to 6 mm polyps in the descending colon and in the cecum, removed with a cold snare. Resected and retrieved. - Likely benign polypoid lesion at the anus. - The distal rectum and anal verge are normal on retroflexion view. - The examined portion of the ileum was normal.  Assessment and Plan: This is a 61 y.o. female with an anal polyp.  --Discussed with the patient the findings on her colonoscopy and that it would be recommended to proceed with excision in order to make sure this is not a precancerous lesion  or other issue of concern.  On exam today, this appears to be in relation to a right posterior enlarged internal hemorrhoid.  She is in agreement to proceed with excision. --Discussed with the patient the plan for an Exam under Anesthesia with excision of anal polyp and hemorrhoidectomy.  Reviewed with her the surgery at length including risks of bleeding, infection, injury to surrounding structures, that this is an outpatient surgery, post-op pain control, activity restrictions, and she's willing to proceed. --Will schedule her for 12/08/21.  I spent 60 minutes dedicated to the care of this patient on the date of this encounter to include pre-visit review of records, face-to-face time with the patient discussing diagnosis and management, and any post-visit coordination of care.   Melvyn Neth, [facility]

## 2021-12-02 NOTE — Progress Notes (Addendum)
12/02/2021  Reason for Visit:  Anal polyp  Requesting Provider:  Sherri Sear, MD  History of Present Illness: Linda Boyle is a 61 y.o. female presenting for evaluation of an anal polyp.  The patient underwent a colonoscopy on 4/26 with Dr. Marius Ditch for screening purposes.  In the colonoscopy, she was found to have two polyps in the descending colon and cecum, which were resected.  Pathology showed a tubular adenoma in the descending colon, overall negative for high grade dysplasia or malignancy.  She was also found to have a 12 mm polyp in the anus which could not be resected based on location.  The patient reports that she's doing well after her colonoscopy.  Denies any blood in her stool or perianal pain.  She does report that sometimes she can feel something protruding from the anal canal when she has bowel movements.  Denies any issues with hemorrhoids in the past.   Of note, the patient currently is getting worked up by her PCP for elevated AST/ALT.  Workup so far has shown her to be positive for Hepatitis A antibody.  Her more recent AST/ALT was starting to trend down.  Past Medical History: Past Medical History:  Diagnosis Date   Allergy    Diabetes mellitus without complication (HCC)    GERD (gastroesophageal reflux disease)    Hyperlipidemia    Hypertension    Mitral valve prolapse    Neck sprain    Vertigo      Past Surgical History: Past Surgical History:  Procedure Laterality Date   ABDOMINAL HYSTERECTOMY     partial   COLONOSCOPY WITH PROPOFOL N/A 11/02/2021   Procedure: COLONOSCOPY WITH PROPOFOL;  Surgeon: Lin Landsman, MD;  Location: [facility] END[facility]COPY;  Service: Gastroenterology;  Laterality: N/A;   DILATION AND CURETTAGE OF UTERUS  2229,7989   TOTAL VAGINAL HYSTERECTOMY  1994   Ovaries remain   TUBAL LIGATION      Home Medications: Prior to Admission medications   Medication Sig Start Date End Date Taking? Authorizing Provider  atorvastatin (LIPITOR) 40  MG tablet Take 1 tablet (40 mg total) by mouth daily. 10/03/21  Yes Cannady, Jolene T, NP  blood glucose meter kit and supplies KIT Dispense based on patient and insurance preference. Use up to four times daily as directed. (FOR ICD-9 250.00, 250.01). 01/21/20  Yes Cannady, Jolene T, NP  Cholecalciferol (VITAMIN D-3) 1000 UNITS CAPS Take 1,000 Units by mouth daily.   Yes [provider]  fluticasone (FLONASE) 50 MCG/ACT nasal spray Place 2 sprays into both nostrils daily. 04/04/21  Yes Cannady, Jolene T, NP  Lancets (ONETOUCH DELICA PLUS [id number]) MISC USE TO CHECK BLOOD SUGAR UP TO 4 TIMES DAILY 10/07/21  Yes Cannady, Jolene T, NP  loratadine (CLARITIN) 10 MG tablet Take 10 mg by mouth daily.   Yes [provider]  meclizine (ANTIVERT) 25 MG tablet Take 1 tablet (25 mg total) by mouth 3 (three) times daily as needed for dizziness. 01/30/19  Yes Cannady, Jolene T, NP  omeprazole (PRILOSEC) 20 MG capsule Take 1 capsule (20 mg total) by mouth daily. 04/04/21  Yes Cannady, Jolene T, NP  ONETOUCH ULTRA test strip USE TO CHECK BLOOD SUGAR UP TO 4 TIMES DAILY AS DIRECTED 10/07/21  Yes Cannady, Jolene T, NP  verapamil (VERELAN PM) 180 MG 24 hr capsule Take 1 capsule (180 mg total) by mouth at bedtime. 04/04/21  Yes Venita Lick, NP    Allergies: Allergies  Allergen Reactions  Lisinopril     cough   Losartan Cough    Social History:  reports that she has never smoked. She has never been exposed to tobacco smoke. She has never used smokeless tobacco. She reports that she does not drink alcohol and does not use drugs.   Family History: Family History  Problem Relation Age of Onset   Diabetes Mother    Alzheimer's disease Father    Hypertension Father    Diabetes Sister    Hypertension Brother    Asthma Brother    Diabetes Brother    Hypertension Sister    Breast cancer Neg Hx     Review of Systems: Review of Systems  Constitutional:  Negative for chills and fever.   HENT:  Negative for hearing loss.   Respiratory:  Negative for shortness of breath.   Cardiovascular:  Negative for chest pain.  Gastrointestinal:  Negative for abdominal pain, blood in stool, constipation, nausea and vomiting.  Genitourinary:  Negative for dysuria.  Musculoskeletal:  Negative for myalgias.  Skin:  Negative for rash.  Neurological:  Negative for dizziness.  Psychiatric/Behavioral:  Negative for depression.    Physical Exam BP 131/84   Pulse 71   Temp 98 F (36.7 C)   Ht _0  (1.549 m)   Wt 158 lb (71.7 kg)   LMP 10/14/1992 (Approximate)   SpO2 97%   BMI 29.85 kg/m  CONSTITUTIONAL: No acute distress, well nourished. HEENT:  Normocephalic, atraumatic, extraocular motion intact. NECK: Trachea is midline, and there is no jugular venous distension.  RESPIRATORY:  Lungs are clear, and breath sounds are equal bilaterally. Normal respiratory effort without pathologic use of accessory muscles. CARDIOVASCULAR: Heart is regular without murmurs, gallops, or rubs. RECTAL:  External exam reveals minimally enlarged external hemorrhoid tissue, without any inflammation or induration or tenderness.  On digital rectal exam, it was difficult to feel a discrete lesion or mass, so anoscopy was performed.  This showed an enlarged internal right posterior hemorrhoid tissue, and coming from it was a 1 cm mass as seen on colonoscopy consistent with polyp vs enlarged papilla.  No gross blood but after removing anoscope, can see the mass protruding beyond the anal verge, consistent with the patient's report.   MUSCULOSKELETAL:  Normal muscle strength and tone in all four extremities.  No peripheral edema or cyanosis. SKIN: Skin turgor is normal. There are no pathologic skin lesions.  NEUROLOGIC:  Motor and sensation is grossly normal.  Cranial nerves are grossly intact. PSYCH:  Alert and oriented to person, place and time. Affect is normal.  Laboratory Analysis: Pathology  11/02/21: DIAGNOSIS:  A.  COLON POLYP, CECUM; COLD SNARE:  - COLONIC MUCOSA WITH PROMINENT LYMPHOID AGGREGATE.  - NEGATIVE FOR DYSPLASIA AND MALIGNANCY.  - DEEPER SECTIONS EXAMINED.   B.  COLON POLYP, DESCENDING; COLD SNARE:  - TUBULAR ADENOMA.  - NEGATIVE FOR HIGH-GRADE DYSPLASIA AND MALIGNANCY   Imaging: Colonoscopy 11/02/21: IMPRESSION: - Two 4 to 6 mm polyps in the descending colon and in the cecum, removed with a cold snare. Resected and retrieved. - Likely benign polypoid lesion at the anus. - The distal rectum and anal verge are normal on retroflexion view. - The examined portion of the ileum was normal.  Assessment and Plan: This is a 61 y.o. female with an anal polyp.  --Discussed with the patient the findings on her colonoscopy and that it would be recommended to proceed with excision in order to make sure this is not a precancerous lesion  or other issue of concern.  On exam today, this appears to be in relation to a right posterior enlarged internal hemorrhoid.  She is in agreement to proceed with excision. --Discussed with the patient the plan for an Exam under Anesthesia with excision of anal polyp and hemorrhoidectomy.  Reviewed with her the surgery at length including risks of bleeding, infection, injury to surrounding structures, that this is an outpatient surgery, post-op pain control, activity restrictions, and she's willing to proceed. --Will schedule her for 12/08/21.  I spent 60 minutes dedicated to the care of this patient on the date of this encounter to include pre-visit review of records, face-to-face time with the patient discussing diagnosis and management, and any post-visit coordination of care.   Melvyn Neth, [facility]

## 2021-12-02 NOTE — Patient Instructions (Addendum)
You have requested to have your anal polyp removed. This will be done on 12/08/21 at [facility] with Dr. Hampton Abbot.  You will need to do a Fleets enema the morning of your surgery.   You will most likely be out of work 1-3 days for this surgery.  If you have FMLA or disability paperwork that needs filled out you may drop this off at our office or this can be faxed to [phone].  Please see the (blue)pre-care form that you have been given today. Our surgery scheduler will call you to verify surgery date and to go over information.    If you have any questions, please call our office.

## 2021-12-06 ENCOUNTER — Encounter: Payer: Self-pay | Admitting: Urgent Care

## 2021-12-06 ENCOUNTER — Encounter
Admission: RE | Admit: 2021-12-06 | Discharge: 2021-12-06 | Disposition: A | Discharge status: Home / Self Care | Payer: 59 | Source: Ambulatory Visit | Attending: Surgery | Admitting: Surgery

## 2021-12-06 ENCOUNTER — Telehealth: Payer: Self-pay | Admitting: Surgery

## 2021-12-06 ENCOUNTER — Other Ambulatory Visit: Payer: Self-pay

## 2021-12-06 ENCOUNTER — Encounter: Payer: Self-pay | Admitting: Nurse Practitioner

## 2021-12-06 VITALS — Ht 61.0 in | Wt 156.0 lb

## 2021-12-06 DIAGNOSIS — E1159 Type 2 diabetes mellitus with other circulatory complications: Secondary | ICD-10-CM | POA: Diagnosis not present

## 2021-12-06 DIAGNOSIS — Z01818 Encounter for other preprocedural examination: Secondary | ICD-10-CM | POA: Diagnosis not present

## 2021-12-06 DIAGNOSIS — E669 Obesity, unspecified: Secondary | ICD-10-CM | POA: Insufficient documentation

## 2021-12-06 DIAGNOSIS — I152 Hypertension secondary to endocrine disorders: Secondary | ICD-10-CM | POA: Diagnosis not present

## 2021-12-06 DIAGNOSIS — E1169 Type 2 diabetes mellitus with other specified complication: Secondary | ICD-10-CM

## 2021-12-06 HISTORY — DX: Inflammatory liver disease, unspecified: K75.9

## 2021-12-06 LAB — CBC
HCT: 40.4 % (ref 36.0–46.0)
Hemoglobin: 13.3 g/dL (ref 12.0–15.0)
MCH: 29.6 pg (ref 26.0–34.0)
MCHC: 32.9 g/dL (ref 30.0–36.0)
MCV: 90 fL (ref 80.0–100.0)
Platelets: 244 10*3/uL (ref 150–400)
RBC: 4.49 MIL/uL (ref 3.87–5.11)
RDW: 12.8 % (ref 11.5–15.5)
WBC: 4.1 10*3/uL (ref 4.0–10.5)
nRBC: 0 % (ref 0.0–0.2)

## 2021-12-06 NOTE — Telephone Encounter (Signed)
Patient has been advised of Pre-Admission date/time, COVID Testing date and Surgery date.  Surgery Date: 12/08/21 Preadmission Testing Date: 12/06/21 in person Covid Testing Date: Not needed.     Patient has been made aware to call [phone], between 1-3:00pm the day before surgery, to find out what time to arrive for surgery.

## 2021-12-06 NOTE — Patient Instructions (Addendum)
Your procedure is scheduled on: Thursday 12/08/21 Report to the Registration Desk on the 1st floor of the [facility]. To find out your arrival time, please call [phone] between 1PM - 3PM on: Wednesday 12/07/21 If your arrival time is 6:00 am, do not arrive prior to that time as the [place] entrance doors do not open until 6:00 am.  REMEMBER: Instructions that are not followed completely may result in serious medical risk, up to and including death; or upon the discretion of your surgeon and anesthesiologist your surgery may need to be rescheduled.  Do not eat food after midnight the night before surgery.  No gum chewing, lozengers or hard candies.  You may however, drink water liquids up to 2 hours before you are scheduled to arrive for your surgery. Do not drink anything within 2 hours of your scheduled arrival time.  Type 2 diabetics should only drink water.  TAKE THESE MEDICATIONS THE MORNING OF SURGERY WITH A SIP OF WATER: omeprazole (PRILOSEC) 20 MG capsule (take one the night before and one on the morning of surgery - helps to prevent nausea after surgery.)  One week prior to surgery: Stop Anti-inflammatories (NSAIDS) such as Advil, Aleve, Ibuprofen, Motrin, Naproxen, Naprosyn and Aspirin based products such as Excedrin, Goodys Powder, BC Powder.  Stop taking your Cholecalciferol (VITAMIN D-3) 1000 UNITS CAPS ANY OVER THE COUNTER supplements until after surgery.  You may however, continue to take Tylenol if needed for pain up until the day of surgery.  No Alcohol for 24 hours before or after surgery.  No Smoking including e-cigarettes for 24 hours prior to surgery.  No chewable tobacco products for at least 6 hours prior to surgery.  No nicotine patches on the day of surgery.  Do not use any "recreational" drugs for at least a week prior to your surgery.  Please be advised that the combination of cocaine and anesthesia may have negative outcomes, up to and including  death. If you test positive for cocaine, your surgery will be cancelled.  On the morning of surgery brush your teeth with toothpaste and water, you may rinse your mouth with mouthwash if you wish. Do not swallow any toothpaste or mouthwash.  Use CHG wipes as directed on instruction sheet.  Do not wear jewelry, make-up, hairpins, clips or nail polish.  Do not wear lotions, powders, or perfumes.   Do not shave body from the neck down 48 hours prior to surgery just in case you cut yourself which could leave a site for infection.  Also, freshly shaved skin may become irritated if using the CHG soap.  dentures may not be worn into surgery.  Do not bring valuables to the hospital. [facility] is not responsible for any missing/lost belongings or valuables.   Fleets enema or bowel prep as directed.  Notify your doctor if there is any change in your medical condition (cold, fever, infection).  Wear comfortable clothing (specific to your surgery type) to the hospital.  After surgery, you can help prevent lung complications by doing breathing exercises.  Take deep breaths and cough every 1-2 hours.  If you are being discharged the day of surgery, you will not be allowed to drive home. You will need a responsible adult (18 years or older) to drive you home and stay with you that night.   If you are taking public transportation, you will need to have a responsible adult (18 years or older) with you. Please confirm with your physician that  it is acceptable to use public transportation.   Please call the [facility] Dept. at [phone] if you have any questions about these instructions.  Surgery Visitation Policy:  Patients undergoing a surgery or procedure may have two family members or support persons with them as long as the person is not COVID-19 positive or experiencing its symptoms.   Inpatient Visitation:    Visiting hours are 7 a.m. to 8 p.m. Up to four visitors  are allowed at one time in a patient room, including children. The visitors may rotate out with other people during the day. One designated support person (adult) may remain overnight.

## 2021-12-07 ENCOUNTER — Other Ambulatory Visit: Payer: Self-pay | Admitting: Nurse Practitioner

## 2021-12-07 MED ORDER — ROSUVASTATIN CALCIUM 40 MG PO TABS: 40.0000 mg | ORAL_TABLET | Freq: Every day | ORAL | 3 refills | Status: DC

## 2021-12-08 ENCOUNTER — Ambulatory Visit: Payer: 59 | Admitting: Urgent Care

## 2021-12-08 ENCOUNTER — Other Ambulatory Visit: Payer: Self-pay

## 2021-12-08 ENCOUNTER — Encounter: Payer: Self-pay | Admitting: Surgery

## 2021-12-08 ENCOUNTER — Ambulatory Visit: Payer: 59 | Admitting: Anesthesiology

## 2021-12-08 ENCOUNTER — Encounter
Admission: RE | Disposition: A | Discharge status: Home / Self Care | Payer: Self-pay | Source: Ambulatory Visit | Attending: Surgery

## 2021-12-08 ENCOUNTER — Ambulatory Visit
Admission: RE | Admit: 2021-12-08 | Discharge: 2021-12-08 | Disposition: A | Discharge status: Home / Self Care | Payer: 59 | Source: Ambulatory Visit | Attending: Surgery | Admitting: Surgery

## 2021-12-08 DIAGNOSIS — I10 Essential (primary) hypertension: Secondary | ICD-10-CM | POA: Insufficient documentation

## 2021-12-08 DIAGNOSIS — K648 Other hemorrhoids: Secondary | ICD-10-CM | POA: Insufficient documentation

## 2021-12-08 DIAGNOSIS — K644 Residual hemorrhoidal skin tags: Secondary | ICD-10-CM

## 2021-12-08 DIAGNOSIS — E669 Obesity, unspecified: Secondary | ICD-10-CM

## 2021-12-08 DIAGNOSIS — K219 Gastro-esophageal reflux disease without esophagitis: Secondary | ICD-10-CM | POA: Insufficient documentation

## 2021-12-08 DIAGNOSIS — E119 Type 2 diabetes mellitus without complications: Secondary | ICD-10-CM | POA: Insufficient documentation

## 2021-12-08 DIAGNOSIS — R7401 Elevation of levels of liver transaminase levels: Secondary | ICD-10-CM | POA: Diagnosis not present

## 2021-12-08 DIAGNOSIS — K620 Anal polyp: Secondary | ICD-10-CM | POA: Diagnosis present

## 2021-12-08 HISTORY — PX: EVALUATION UNDER ANESTHESIA WITH HEMORRHOIDECTOMY: SHX5624

## 2021-12-08 LAB — GLUCOSE, CAPILLARY
Glucose-Capillary: 105 mg/dL — ABNORMAL HIGH (ref 70–99)
Glucose-Capillary: 120 mg/dL — ABNORMAL HIGH (ref 70–99)

## 2021-12-08 SURGERY — EXAM UNDER ANESTHESIA WITH HEMORRHOIDECTOMY
Anesthesia: General

## 2021-12-08 MED ORDER — GABAPENTIN 300 MG PO CAPS
ORAL_CAPSULE | ORAL | Status: AC
  Administered 2021-12-08: 300 mg via ORAL
  Filled 2021-12-08: qty 1

## 2021-12-08 MED ORDER — SODIUM CHLORIDE 0.9 % IV SOLN
2.0000 g | INTRAVENOUS | Status: AC
  Administered 2021-12-08: 2 g via INTRAVENOUS

## 2021-12-08 MED ORDER — GABAPENTIN 300 MG PO CAPS: 300.0000 mg | ORAL_CAPSULE | ORAL | Status: AC

## 2021-12-08 MED ORDER — ORAL CARE MOUTH RINSE: 15.0000 mL | Freq: Once | OROMUCOSAL | Status: AC

## 2021-12-08 MED ORDER — FENTANYL CITRATE (PF) 100 MCG/2ML IJ SOLN: 25.0000 ug | INTRAMUSCULAR | Status: DC | PRN

## 2021-12-08 MED ORDER — ACETAMINOPHEN 500 MG PO TABS: 1000.0000 mg | ORAL_TABLET | Freq: Four times a day (QID) | ORAL | Status: DC | PRN

## 2021-12-08 MED ORDER — IBUPROFEN 600 MG PO TABS: 600.0000 mg | ORAL_TABLET | Freq: Three times a day (TID) | ORAL | 1 refills | Status: DC | PRN

## 2021-12-08 MED ORDER — ACETAMINOPHEN 500 MG PO TABS: 1000.0000 mg | ORAL_TABLET | ORAL | Status: AC

## 2021-12-08 MED ORDER — CHLORHEXIDINE GLUCONATE CLOTH 2 % EX PADS
6.0000 | MEDICATED_PAD | Freq: Once | CUTANEOUS | Status: AC
  Administered 2021-12-08: 6 via TOPICAL

## 2021-12-08 MED ORDER — BUPIVACAINE-EPINEPHRINE (PF) 0.5% -1:200000 IJ SOLN
INTRAMUSCULAR | Status: AC
  Filled 2021-12-08: qty 30

## 2021-12-08 MED ORDER — BUPIVACAINE LIPOSOME 1.3 % IJ SUSP
INTRAMUSCULAR | Status: DC | PRN
  Administered 2021-12-08: 20 mL

## 2021-12-08 MED ORDER — MIDAZOLAM HCL 2 MG/2ML IJ SOLN
INTRAMUSCULAR | Status: DC | PRN
  Administered 2021-12-08 (×2): 1 mg via INTRAVENOUS

## 2021-12-08 MED ORDER — OXYCODONE HCL 5 MG PO TABS: 5.0000 mg | ORAL_TABLET | Freq: Four times a day (QID) | ORAL | 0 refills | Status: DC | PRN

## 2021-12-08 MED ORDER — ACETAMINOPHEN 500 MG PO TABS
ORAL_TABLET | ORAL | Status: AC
  Administered 2021-12-08: 1000 mg via ORAL
  Filled 2021-12-08: qty 2

## 2021-12-08 MED ORDER — PHENYLEPHRINE 80 MCG/ML (10ML) SYRINGE FOR IV PUSH (FOR BLOOD PRESSURE SUPPORT)
PREFILLED_SYRINGE | INTRAVENOUS | Status: DC | PRN
  Administered 2021-12-08: 160 ug via INTRAVENOUS

## 2021-12-08 MED ORDER — DROPERIDOL 2.5 MG/ML IJ SOLN: 0.6250 mg | Freq: Once | INTRAMUSCULAR | Status: DC | PRN

## 2021-12-08 MED ORDER — CHLORHEXIDINE GLUCONATE 0.12 % MT SOLN
OROMUCOSAL | Status: AC
  Administered 2021-12-08: 15 mL via OROMUCOSAL
  Filled 2021-12-08: qty 15

## 2021-12-08 MED ORDER — LIDOCAINE 5 % EX OINT
1.0000 | TOPICAL_OINTMENT | Freq: Four times a day (QID) | CUTANEOUS | 0 refills | Status: DC | PRN
Start: 2021-12-08 — End: 2022-04-05

## 2021-12-08 MED ORDER — SODIUM CHLORIDE 0.9 % IV SOLN: INTRAVENOUS | Status: DC

## 2021-12-08 MED ORDER — OXYCODONE HCL 5 MG PO TABS: 5.0000 mg | ORAL_TABLET | Freq: Once | ORAL | Status: DC | PRN

## 2021-12-08 MED ORDER — SODIUM CHLORIDE 0.9 % IV SOLN
INTRAVENOUS | Status: AC
  Filled 2021-12-08: qty 2

## 2021-12-08 MED ORDER — BUPIVACAINE-EPINEPHRINE 0.5% -1:200000 IJ SOLN
INTRAMUSCULAR | Status: DC | PRN
  Administered 2021-12-08: 30 mL

## 2021-12-08 MED ORDER — ACETAMINOPHEN 10 MG/ML IV SOLN: 1000.0000 mg | Freq: Once | INTRAVENOUS | Status: DC | PRN

## 2021-12-08 MED ORDER — GELATIN ABSORBABLE 100 EX MISC
CUTANEOUS | Status: DC | PRN
  Administered 2021-12-08: 1

## 2021-12-08 MED ORDER — FENTANYL CITRATE (PF) 100 MCG/2ML IJ SOLN
INTRAMUSCULAR | Status: DC | PRN
  Administered 2021-12-08 (×3): 25 ug via INTRAVENOUS

## 2021-12-08 MED ORDER — BUPIVACAINE LIPOSOME 1.3 % IJ SUSP: 20.0000 mL | Freq: Once | INTRAMUSCULAR | Status: DC

## 2021-12-08 MED ORDER — GELATIN ABSORBABLE 100 CM EX MISC
CUTANEOUS | Status: AC
  Filled 2021-12-08: qty 1

## 2021-12-08 MED ORDER — BUPIVACAINE LIPOSOME 1.3 % IJ SUSP
INTRAMUSCULAR | Status: AC
  Filled 2021-12-08: qty 20

## 2021-12-08 MED ORDER — PROPOFOL 10 MG/ML IV BOLUS
INTRAVENOUS | Status: DC | PRN
  Administered 2021-12-08: 150 mg via INTRAVENOUS
  Administered 2021-12-08: 50 mg via INTRAVENOUS

## 2021-12-08 MED ORDER — CHLORHEXIDINE GLUCONATE 0.12 % MT SOLN: 15.0000 mL | Freq: Once | OROMUCOSAL | Status: AC

## 2021-12-08 MED ORDER — PROMETHAZINE HCL 25 MG/ML IJ SOLN: 6.2500 mg | INTRAMUSCULAR | Status: DC | PRN

## 2021-12-08 MED ORDER — MIDAZOLAM HCL 2 MG/2ML IJ SOLN
INTRAMUSCULAR | Status: AC
  Filled 2021-12-08: qty 2

## 2021-12-08 MED ORDER — FENTANYL CITRATE (PF) 100 MCG/2ML IJ SOLN
INTRAMUSCULAR | Status: AC
  Filled 2021-12-08: qty 2

## 2021-12-08 MED ORDER — ONDANSETRON HCL 4 MG/2ML IJ SOLN
INTRAMUSCULAR | Status: AC
  Filled 2021-12-08: qty 2

## 2021-12-08 MED ORDER — PROPOFOL 10 MG/ML IV BOLUS
INTRAVENOUS | Status: AC
  Filled 2021-12-08: qty 20

## 2021-12-08 MED ORDER — LIDOCAINE HCL (PF) 2 % IJ SOLN
INTRAMUSCULAR | Status: AC
  Filled 2021-12-08: qty 5

## 2021-12-08 MED ORDER — FLEET ENEMA 7-19 GM/118ML RE ENEM
1.0000 | ENEMA | Freq: Once | RECTAL | Status: AC
  Administered 2021-12-08: 1 via RECTAL

## 2021-12-08 MED ORDER — DEXAMETHASONE SODIUM PHOSPHATE 10 MG/ML IJ SOLN
INTRAMUSCULAR | Status: AC
  Filled 2021-12-08: qty 1

## 2021-12-08 MED ORDER — OXYCODONE HCL 5 MG/5ML PO SOLN: 5.0000 mg | Freq: Once | ORAL | Status: DC | PRN

## 2021-12-08 MED ORDER — DEXAMETHASONE SODIUM PHOSPHATE 10 MG/ML IJ SOLN
INTRAMUSCULAR | Status: DC | PRN
  Administered 2021-12-08: 5 mg via INTRAVENOUS

## 2021-12-08 MED ORDER — GELATIN ABSORBABLE 12-7 MM EX MISC
CUTANEOUS | Status: AC
  Filled 2021-12-08: qty 1

## 2021-12-08 MED ORDER — LIDOCAINE HCL (CARDIAC) PF 100 MG/5ML IV SOSY
PREFILLED_SYRINGE | INTRAVENOUS | Status: DC | PRN
  Administered 2021-12-08: 80 mg via INTRAVENOUS

## 2021-12-08 SURGICAL SUPPLY — 34 items
DRAPE PERI LITHO V/GYN (MISCELLANEOUS) ×2 IMPLANT
DRAPE UNDER BUTTOCK W/FLU (DRAPES) ×2 IMPLANT
DRSG GAUZE FLUFF 36X18 (GAUZE/BANDAGES/DRESSINGS) ×2 IMPLANT
ELECT CAUTERY BLADE TIP 2.5 (TIP) ×2
ELECT REM PT RETURN 9FT ADLT (ELECTROSURGICAL) ×2
ELECTRODE CAUTERY BLDE TIP 2.5 (TIP) ×1 IMPLANT
ELECTRODE REM PT RTRN 9FT ADLT (ELECTROSURGICAL) ×1 IMPLANT
GAUZE 4X4 16PLY ~~LOC~~+RFID DBL (SPONGE) ×2 IMPLANT
GLOVE SURG SYN 7.0 (GLOVE) ×6 IMPLANT
GLOVE SURG SYN 7.0 PF PI (GLOVE) ×1 IMPLANT
GLOVE SURG SYN 7.5  E (GLOVE) ×3
GLOVE SURG SYN 7.5 E (GLOVE) ×3 IMPLANT
GLOVE SURG SYN 7.5 PF PI (GLOVE) ×1 IMPLANT
GOWN STRL REUS W/ TWL LRG LVL3 (GOWN DISPOSABLE) ×2 IMPLANT
GOWN STRL REUS W/TWL LRG LVL3 (GOWN DISPOSABLE) ×3
KIT TURNOVER KIT A (KITS) ×2 IMPLANT
LABEL OR SOLS (LABEL) ×2 IMPLANT
MANIFOLD NEPTUNE II (INSTRUMENTS) ×2 IMPLANT
NEEDLE HYPO 22GX1.5 SAFETY (NEEDLE) ×2 IMPLANT
NS IRRIG 500ML POUR BTL (IV SOLUTION) ×2 IMPLANT
PACK BASIN MINOR ARMC (MISCELLANEOUS) ×2 IMPLANT
PAD OB MATERNITY 4.3X12.25 (PERSONAL CARE ITEMS) ×1 IMPLANT
PAD PREP 24X41 OB/GYN DISP (PERSONAL CARE ITEMS) ×1 IMPLANT
PANTS MESH DISP 2XL (UNDERPADS AND DIAPERS) ×1 IMPLANT
PANTS MESH DISPOSABLE 2XL (UNDERPADS AND DIAPERS) ×1
SHEARS HARMONIC 9CM CVD (BLADE) ×1 IMPLANT
SOL PREP PVP 2OZ (MISCELLANEOUS) ×2
SOLUTION PREP PVP 2OZ (MISCELLANEOUS) ×1 IMPLANT
SURGILUBE 2OZ TUBE FLIPTOP (MISCELLANEOUS) ×2 IMPLANT
SUT VIC AB 2-0 SH 27 (SUTURE)
SUT VIC AB 2-0 SH 27XBRD (SUTURE) ×2 IMPLANT
SYR 10ML LL (SYRINGE) ×2 IMPLANT
SYR BULB IRRIG 60ML STRL (SYRINGE) ×2 IMPLANT
WATER STERILE IRR 500ML POUR (IV SOLUTION) ×2 IMPLANT

## 2021-12-08 NOTE — Interval H&P Note (Signed)
History and Physical Interval Note:  12/08/2021 12:23 PM  Linda Boyle  has presented today for surgery, with the diagnosis of Anal polyp, internal hemorrhoid.  The various methods of treatment have been discussed with the patient and family. After consideration of risks, benefits and other options for treatment, the patient has consented to  Procedure(s) with comments: EXAM UNDER ANESTHESIA WITH HEMORRHOIDECTOMY (N/A) - with excision of anal polyp as a surgical intervention.  The patient's history has been reviewed, patient examined, no change in status, stable for surgery.  I have reviewed the patient's chart and labs.  Questions were answered to the patient's satisfaction.     Pragya Lofaso

## 2021-12-08 NOTE — Transfer of Care (Signed)
Immediate Anesthesia Transfer of Care Note  Patient: Linda Boyle  Procedure(s) Performed: EXAM UNDER ANESTHESIA WITH HEMORRHOIDECTOMY  Patient Location: PACU  Anesthesia Type:General  Level of Consciousness: awake and drowsy  Airway & Oxygen Therapy: Patient Spontanous Breathing and Patient connected to face mask oxygen  Post-op Assessment: Report given to RN and Post -op Vital signs reviewed and stable  Post vital signs: Reviewed and stable  Last Vitals:  Vitals Value Taken Time  BP 146/80 12/08/21 1416  Temp    Pulse 73 12/08/21 1416  Resp 13 12/08/21 1416  SpO2 100 % 12/08/21 1416    Last Pain:  Vitals:   12/08/21 1201  TempSrc: Temporal  PainSc: 0-No pain         Complications: No notable events documented.

## 2021-12-08 NOTE — Op Note (Signed)
Procedure Date:  12/08/2021  Pre-operative Diagnosis:  Anal polyp and internal hemorrhoid  Post-operative Diagnosis: Anal polyp x 2, internal and external hemorrhoid  Procedure:  Exam under Anesthesia, Hemorrhoidectomy of posterior column with excision of two anal polyps  Surgeon:  Melvyn Neth, MD  Assistant:  Rolinda Roan, PA-S  Anesthesia:  General endotracheal  Estimated Blood Loss:  3 ml  Specimens: Posterior hemorrhoid with anal polyps  Complications:  None  Indications for Procedure:  This is a 61 y.o. female with an anal polyp found on colonoscopy, presenting for excision.  On exam, the polyp was found to be involved with internal hemorrhoid tissue, which would also be resected.  The risks of bleeding, infection, bowel injury, and need for further procedures were all discussed with the patient and she was willing to proceed.   Description of Procedure: The patient was correctly identified in the preoperative area and brought into the operating room.  The patient was placed supine with VTE prophylaxis in place.  Appropriate time-outs were performed.  Anesthesia was induced and the patient was intubated.  Appropriate antibiotics were infused.  The patient was then placed in high lithotomy position.   The perianal area was prepped and draped in usual sterile fashion.  The sphincter was digitally dilated.  Then the anoscope was inserted and the anal canal was evaluated, revealing internal and external enlarged hemorrhoid tissue posteriorly, with two anal polyps vs enlarged papillae.  The bivalve retractor was then inserted, and we proceeded with excision of the posterior column.  We started with cautery at the skin externally, and the Harmonic device going to the pedicle of the posterior column.  The hemorrhoid tissue with the two masses were resected en bloc.  Careful attention was given to ensure no injury to the sphincter muscle.  50 ml of Exparel solution was infiltrated into  the perianal area and as bilateral pudendal blocks.  The anal canal was irrigated and a large gelfoam gauze was rolled and inserted into the anal canal for further hemostasis.  The perianal area was cleaned and dressed with fluffed gauze and mesh underwear.  The patient was emerged from anesthesia and extubated and brought to the recovery room for further management.  The patient tolerated the procedure well and all counts were correct at the end of the case.   Melvyn Neth, MD

## 2021-12-08 NOTE — Anesthesia Preprocedure Evaluation (Addendum)
Anesthesia Evaluation  Patient identified by MRN, date of birth, ID band Patient awake    Reviewed: Allergy & Precautions, NPO status , Patient's Chart, lab work & pertinent test results  History of Anesthesia Complications Negative for: history of anesthetic complications  Airway Mallampati: III  TM Distance: >3 FB Neck ROM: Full    Dental  (+) Missing, Partial Lower,    Pulmonary neg pulmonary ROS, neg sleep apnea, neg COPD, Patient abstained from smoking.Not current smoker,    Pulmonary exam normal breath sounds clear to auscultation       Cardiovascular Exercise Tolerance: Good METShypertension, Pt. on medications (-) CAD and (-) Past MI (-) dysrhythmias + Valvular Problems/Murmurs MVP  Rhythm:Regular Rate:Normal - Systolic murmurs    Neuro/Psych negative neurological ROS  negative psych ROS   GI/Hepatic GERD  Controlled and Medicated,(+)     (-) substance abuse  , Hepatitis -, A  Endo/Other  diabetes, Well Controlled, Type 2  Renal/GU negative Renal ROS     Musculoskeletal   Abdominal Normal abdominal exam  (+)   Peds  Hematology   Anesthesia Other Findings Anal polyp, internal hemorrhoid  Past Medical History: No date: Allergy No date: Diabetes mellitus without complication (HCC) No date: GERD (gastroesophageal reflux disease) No date: Hyperlipidemia No date: Hypertension No date: Mitral valve prolapse No date: Neck sprain No date: Vertigo  Reproductive/Obstetrics                            Anesthesia Physical  Anesthesia Plan  ASA: 2  Anesthesia Plan: General   Post-op Pain Management: Minimal or no pain anticipated   Induction: Intravenous  PONV Risk Score and Plan: 3 and Ondansetron, Dexamethasone and Midazolam  Airway Management Planned: LMA  Additional Equipment: None  Intra-op Plan:   Post-operative Plan: Extubation in OR  Informed Consent: I have  reviewed the patients History and Physical, chart, labs and discussed the procedure including the risks, benefits and alternatives for the proposed anesthesia with the patient or authorized representative who has indicated his/her understanding and acceptance.     Dental advisory given  Plan Discussed with: CRNA and Surgeon  Anesthesia Plan Comments: (Discussed risks of anesthesia with patient, including possibility of difficulty with spontaneous ventilation under anesthesia necessitating airway intervention, PONV, and rare risks such as cardiac or respiratory or neurological events, and allergic reactions. Discussed the role of CRNA in patient's perioperative care. Patient understands.)      Anesthesia Quick Evaluation

## 2021-12-08 NOTE — Anesthesia Procedure Notes (Signed)
Procedure Name: LMA Insertion Date/Time: 12/08/2021 1:02 PM Performed by: Loletha Grayer, CRNA Pre-anesthesia Checklist: Patient identified, Patient being monitored, Timeout performed, Emergency Drugs available and Suction available Patient Re-evaluated:Patient Re-evaluated prior to induction Oxygen Delivery Method: Circle system utilized Preoxygenation: Pre-oxygenation with 100% oxygen Induction Type: IV induction Ventilation: Mask ventilation without difficulty LMA: LMA inserted LMA Size: 4.0 Number of attempts: 1 Placement Confirmation: positive ETCO2 Tube secured with: Tape Dental Injury: Teeth and Oropharynx as per pre-operative assessment

## 2021-12-08 NOTE — Discharge Instructions (Signed)

## 2021-12-09 ENCOUNTER — Encounter: Payer: Self-pay | Admitting: Surgery

## 2021-12-09 NOTE — Anesthesia Postprocedure Evaluation (Signed)
Anesthesia Post Note  Patient: Linda Boyle  Procedure(s) Performed: EXAM UNDER ANESTHESIA WITH HEMORRHOIDECTOMY  Patient location during evaluation: PACU Anesthesia Type: General Level of consciousness: awake and alert Pain management: pain level controlled Vital Signs Assessment: post-procedure vital signs reviewed and stable Respiratory status: spontaneous breathing, nonlabored ventilation and respiratory function stable Cardiovascular status: blood pressure returned to baseline and stable Postop Assessment: no apparent nausea or vomiting Anesthetic complications: no   No notable events documented.   Last Vitals:  Vitals:   12/08/21 1541 12/08/21 1630  BP: (!) 153/91 128/75  Pulse: 77 72  Resp: 16 18  Temp: (!) 36.2 C (!) 36.3 C  SpO2: 97% 99%    Last Pain:  Vitals:   12/08/21 1630  TempSrc: Temporal  PainSc: 0-No pain                 Iran Ouch

## 2021-12-12 LAB — SURGICAL PATHOLOGY

## 2021-12-19 ENCOUNTER — Other Ambulatory Visit: Payer: 59

## 2021-12-19 DIAGNOSIS — R7989 Other specified abnormal findings of blood chemistry: Secondary | ICD-10-CM

## 2021-12-20 ENCOUNTER — Ambulatory Visit
Admission: RE | Admit: 2021-12-20 | Discharge: 2021-12-20 | Disposition: A | Discharge status: Home / Self Care | Payer: 59 | Source: Ambulatory Visit | Attending: Nurse Practitioner | Admitting: Nurse Practitioner

## 2021-12-20 DIAGNOSIS — Z1231 Encounter for screening mammogram for malignant neoplasm of breast: Secondary | ICD-10-CM | POA: Diagnosis present

## 2021-12-20 LAB — CK: Total CK: 104 U/L (ref 32–182)

## 2021-12-20 LAB — HEPATIC FUNCTION PANEL
ALT: 37 IU/L — ABNORMAL HIGH (ref 0–32)
AST: 30 IU/L (ref 0–40)
Albumin: 3.7 g/dL — ABNORMAL LOW (ref 3.8–4.8)
Alkaline Phosphatase: 79 IU/L (ref 44–121)
Bilirubin Total: 0.3 mg/dL (ref 0.0–1.2)
Bilirubin, Direct: <0.1 mg/dL (ref 0.00–0.40)
Total Protein: 6.2 g/dL (ref 6.0–8.5)

## 2021-12-20 NOTE — Progress Notes (Signed)
Contacted via [facility] [place]   I have good news this morning, overall labs are improving this check.  Will continue on current path.  Any questions? Keep being amazing!!  Thank you for allowing me to participate in your care.  I appreciate you. Kindest regards, Dimitri Shakespeare

## 2021-12-21 NOTE — Progress Notes (Signed)
Contacted via MyChart   Normal mammogram, may repeat in one year:)

## 2021-12-23 ENCOUNTER — Ambulatory Visit: Payer: 59 | Admitting: Nurse Practitioner

## 2021-12-28 ENCOUNTER — Encounter: Payer: Self-pay | Admitting: Surgery

## 2021-12-28 ENCOUNTER — Ambulatory Visit (INDEPENDENT_AMBULATORY_CARE_PROVIDER_SITE_OTHER): Payer: 59 | Admitting: Surgery

## 2021-12-28 VITALS — BP 152/81 | HR 80 | Temp 98.3°F | Wt 157.4 lb

## 2021-12-28 DIAGNOSIS — K644 Residual hemorrhoidal skin tags: Secondary | ICD-10-CM

## 2021-12-28 DIAGNOSIS — K620 Anal polyp: Secondary | ICD-10-CM

## 2021-12-28 DIAGNOSIS — K648 Other hemorrhoids: Secondary | ICD-10-CM

## 2021-12-28 NOTE — Progress Notes (Signed)
12/28/2021  HPI: Linda Boyle is a 61 y.o. female s/p exam under anesthesia with hemorrhoidectomy and excision of anal polyp on 12/08/2021.  Patient presents today for follow-up.  Patient reports some initial pain right after surgery but now reports that there is only mild discomfort.  Denies any worsening issues.  Denies any constipation.  Vital signs: BP (!) 152/81   Pulse 80   Temp 98.3 F (36.8 C) (Oral)   Wt 157 lb 6.4 oz (71.4 kg)   LMP 10/14/1992 (Approximate)   SpO2 100%   BMI 29.74 kg/m    Physical Exam: Constitutional: No acute distress Rectal: External exam reveals still open wound from her excision which is currently healing without any evidence of infection or necrosis.  There is an area of mild inflammation at the edge of the skin but otherwise no evidence of new or flareup hemorrhoid.  Assessment/Plan: This is a 61 y.o. female s/p exam under anesthesia with hemorrhoidectomy and excision of anal polyp.  - Discussed with her the pathology results showing no malignancy or atypia in the tissue.  The main recommendation will be for her to continue following up with colonoscopies as instructed. - Patient is healing well and discussed with her to continue using sitz bath's to help soothe the raw tissue.  This will continue to heal and the inflammation will continue go down on its own.  However if in the next month nothing has improved or if there is any worsening, she should let us know so we can see her again. - Otherwise follow-up as needed.   Melvyn Neth, [facility]

## 2021-12-28 NOTE — Patient Instructions (Signed)
Continue to do sitz baths/warm tub soak for comfort. Be sure to keep yourself regular and avoid constipation.  The tissue is still inflamed from the surgery and will go down with time.  If you are still having problems in a month call us for a follow up.     Follow-up with our office as needed.  Please call and ask to speak with a nurse if you develop questions or concerns.

## 2022-03-02 DIAGNOSIS — M40292 Other kyphosis, cervical region: Secondary | ICD-10-CM | POA: Diagnosis not present

## 2022-03-02 DIAGNOSIS — M9902 Segmental and somatic dysfunction of thoracic region: Secondary | ICD-10-CM | POA: Diagnosis not present

## 2022-03-02 DIAGNOSIS — M62830 Muscle spasm of back: Secondary | ICD-10-CM | POA: Diagnosis not present

## 2022-03-02 DIAGNOSIS — M9901 Segmental and somatic dysfunction of cervical region: Secondary | ICD-10-CM | POA: Diagnosis not present

## 2022-03-20 ENCOUNTER — Ambulatory Visit: Payer: 59 | Admitting: Dermatology

## 2022-03-20 DIAGNOSIS — L821 Other seborrheic keratosis: Secondary | ICD-10-CM | POA: Diagnosis not present

## 2022-03-20 DIAGNOSIS — L820 Inflamed seborrheic keratosis: Secondary | ICD-10-CM

## 2022-03-20 NOTE — Progress Notes (Unsigned)
   Follow-Up Visit   Subjective  Linda Boyle is a 61 y.o. female who presents for the following: Follow-up (4 weeks f/u on ISK's on her face, treated with ED and snip removal on the bilateral cheeks with poor response).    The following portions of the chart were reviewed this encounter and updated as appropriate:       Review of Systems:  No other skin or systemic complaints except as noted in HPI or Assessment and Plan.  Objective  Well appearing patient in no apparent distress; mood and affect are within normal limits.  A focused examination was performed including face. Relevant physical exam findings are noted in the Assessment and Plan.  bilateral cheek x 15 (15) Stuck-on, waxy, tan-brown papules -- Discussed benign etiology and prognosis.            Assessment & Plan  Inflamed seborrheic keratosis (15) bilateral cheek x 15  Symptomatic, irritating, patient would like treated.   Right cheek x 5 Left cheek x 10  Destruction of lesion - bilateral cheek x 15 Complexity: simple   Informed consent: discussed and consent obtained   Anesthesia: the lesion was anesthetized in a standard fashion   Anesthetic:  1% lidocaine w/ epinephrine 1-100,000 local infiltration Hemostasis achieved with:  electrodesiccation Outcome: patient tolerated procedure well with no complications    Seborrheic Keratoses - Stuck-on, waxy, tan-brown papules and/or plaques  - Benign-appearing - Discussed benign etiology and prognosis. - Observe - Call for any changes    Return in about 6 weeks (around 05/01/2022) for ISK's .  IMarye Round, CMA, am acting as scribe for Sarina Ser, MD .

## 2022-03-20 NOTE — Patient Instructions (Addendum)
Wash gently with soap and water everyday.   Apply Vaseline and Band-Aid daily until healed.    Due to recent changes in healthcare laws, you may see results of your pathology and/or laboratory studies on MyChart before the doctors have had a chance to review them. We understand that in some cases there may be results that are confusing or concerning to you. Please understand that not all results are received at the same time and often the doctors may need to interpret multiple results in order to provide you with the best plan of care or course of treatment. Therefore, we ask that you please give [place] 2 business days to thoroughly review all your results before contacting the office for clarification. Should we see a critical lab result, you will be contacted sooner.   If You Need Anything After Your Visit  If you have any questions or concerns for your doctor, please call our main line at [phone] and press option 4 to reach your doctor's medical assistant. If no one answers, please leave a voicemail as directed and we will return your call as soon as possible. Messages left after 4 pm will be answered the following business day.   You may also send [place] a message via [place]. We typically respond to MyChart messages within 1-2 business days.  For prescription refills, please ask your pharmacy to contact our office. Our fax number is [phone].  If you have an urgent issue when the clinic is closed that cannot wait until the next business day, you can page your doctor at the number below.    Please note that while we do our best to be available for urgent issues outside of office hours, we are not available 24/7.   If you have an urgent issue and are unable to reach [place], you may choose to seek medical care at your doctor's office, retail clinic, urgent care center, or emergency room.  If you have a medical emergency, please immediately call 911 or go to the emergency department.  Pager  Numbers  - Dr. Nehemiah Massed: [phone]  - Dr. Laurence Ferrari: [phone]  - Dr. Nicole Kindred: [phone]  In the event of inclement weather, please call our main line at [phone] for an update on the status of any delays or closures.  Dermatology Medication Tips: Please keep the boxes that topical medications come in in order to help keep track of the instructions about where and how to use these. Pharmacies typically print the medication instructions only on the boxes and not directly on the medication tubes.   If your medication is too expensive, please contact our office at [phone] option 4 or send [place] a message through [place].   We are unable to tell what your co-pay for medications will be in advance as this is different depending on your insurance coverage. However, we may be able to find a substitute medication at lower cost or fill out paperwork to get insurance to cover a needed medication.   If a prior authorization is required to get your medication covered by your insurance company, please allow [place] 1-2 business days to complete this process.  Drug prices often vary depending on where the prescription is filled and some pharmacies may offer cheaper prices.  The website www.goodrx.com contains coupons for medications through different pharmacies. The prices here do not account for what the cost may be with help from insurance (it may be cheaper with your insurance), but the website can give you the price  if you did not use any insurance.  - You can print the associated coupon and take it with your prescription to the pharmacy.  - You may also stop by our office during regular business hours and pick up a GoodRx coupon card.  - If you need your prescription sent electronically to a different pharmacy, notify our office through [facility] or by phone at [phone] option 4.     Si Usted Necesita Algo Despus de Su Visita  Tambin puede enviarnos un mensaje a travs de  Pharmacist, community. Por lo general respondemos a los mensajes de MyChart en el transcurso de 1 a 2 das hbiles.  Para renovar recetas, por favor pida a su farmacia que se ponga en contacto con nuestra oficina. Harland Dingwall de fax es [place] [phone].  Si tiene un asunto urgente cuando la clnica est cerrada y que no puede esperar hasta el siguiente da hbil, puede llamar/localizar a su doctor(a) al nmero que aparece a continuacin.   Por favor, tenga en cuenta que aunque hacemos todo lo posible para estar disponibles para asuntos urgentes fuera del horario de [place], no estamos disponibles las 24 horas del da, los 7 das de la [place].   Si tiene un problema urgente y no puede comunicarse con nosotros, puede optar por buscar atencin mdica  en el consultorio de su doctor(a), en una clnica privada, en un centro de atencin urgente o en una sala de emergencias.  Si tiene Engineering geologist, por favor llame inmediatamente al 911 o vaya a la sala de emergencias.  Nmeros de bper  - Dr. Nehemiah Massed: [phone]  - Dra. Moye: [phone]  - Dra. Nicole Kindred: [phone]  En caso de inclemencias del [place], por favor llame a Johnsie Kindred principal al [phone] para una actualizacin sobre el [place] de cualquier retraso o cierre.  Consejos para la medicacin en dermatologa: Por favor, guarde las cajas en las que vienen los medicamentos de uso tpico para ayudarle a seguir las instrucciones sobre dnde y cmo usarlos. Las farmacias generalmente imprimen las instrucciones del medicamento slo en las cajas y no directamente en los tubos del [place].   Si su medicamento es muy caro, por favor, pngase en contacto con Zigmund Daniel llamando al [phone] y presione la opcin 4 o envenos un mensaje a travs de Pharmacist, community.   No podemos decirle cul ser su copago por los medicamentos por adelantado ya que esto es diferente dependiendo de la cobertura de su seguro. Sin embargo, es posible que  podamos encontrar un medicamento sustituto a Electrical engineer un formulario para que el seguro cubra el medicamento que se considera necesario.   Si se requiere una autorizacin previa para que su compaa de seguros [place] su medicamento, por favor permtanos de 1 a 2 das hbiles para completar este proceso.  Los precios de los medicamentos varan con frecuencia dependiendo del Environmental consultant de dnde se surte la receta y alguna farmacias pueden ofrecer precios ms baratos.  El sitio web www.goodrx.com tiene cupones para medicamentos de Airline pilot. Los precios aqu no tienen en cuenta lo que podra costar con la ayuda del seguro (puede ser ms barato con su seguro), pero el sitio web puede darle el precio si no utiliz Research scientist (physical sciences).  - Puede imprimir el cupn correspondiente y llevarlo con su receta a la farmacia.  - Tambin puede pasar por nuestra oficina durante el horario de atencin regular y Charity fundraiser una tarjeta de cupones de GoodRx.  - Si necesita que su  su receta se enve electrnicamente a una farmacia diferente, informe a nuestra oficina a travs de MyChart de [place] o por telfono llamando al [phone] y presione la opcin 4.  

## 2022-03-22 ENCOUNTER — Encounter: Payer: Self-pay | Admitting: Dermatology

## 2022-03-29 LAB — HM DIABETES EYE EXAM

## 2022-03-30 ENCOUNTER — Ambulatory Visit (INDEPENDENT_AMBULATORY_CARE_PROVIDER_SITE_OTHER): Payer: 59 | Admitting: Physician Assistant

## 2022-03-30 ENCOUNTER — Encounter: Payer: Self-pay | Admitting: Physician Assistant

## 2022-03-30 VITALS — BP 129/82 | HR 74 | Temp 98.5°F | Ht 60.98 in | Wt 156.1 lb

## 2022-03-30 DIAGNOSIS — N898 Other specified noninflammatory disorders of vagina: Secondary | ICD-10-CM | POA: Diagnosis not present

## 2022-03-30 DIAGNOSIS — Z23 Encounter for immunization: Secondary | ICD-10-CM

## 2022-03-30 DIAGNOSIS — N390 Urinary tract infection, site not specified: Secondary | ICD-10-CM | POA: Diagnosis not present

## 2022-03-30 DIAGNOSIS — B951 Streptococcus, group B, as the cause of diseases classified elsewhere: Secondary | ICD-10-CM

## 2022-03-30 DIAGNOSIS — R8281 Pyuria: Secondary | ICD-10-CM

## 2022-03-30 LAB — URINALYSIS, ROUTINE W REFLEX MICROSCOPIC
Bilirubin, UA: NEGATIVE
Glucose, UA: NEGATIVE
Ketones, UA: NEGATIVE
Nitrite, UA: NEGATIVE
Specific Gravity, UA: 1.02 (ref 1.005–1.030)
Urobilinogen, Ur: 0.2 mg/dL (ref 0.2–1.0)
pH, UA: 7 (ref 5.0–7.5)

## 2022-03-30 LAB — WET PREP FOR TRICH, YEAST, CLUE
Clue Cell Exam: NEGATIVE
Trichomonas Exam: NEGATIVE
Yeast Exam: NEGATIVE

## 2022-03-30 LAB — MICROSCOPIC EXAMINATION: Bacteria, UA: NONE SEEN

## 2022-03-30 NOTE — Progress Notes (Signed)
        Acute Office Visit   Patient: Linda Boyle   DOB: 12/30/1960   61 y.o. Female  MRN: [id number] Visit Date: 03/30/2022  Today's healthcare provider: Erin E Mecum, PA-C  Introduced myself to the patient as a PA-C and provided education on APPs in clinical practice.    Chief Complaint  Patient presents with   Vaginal Discharge    Wjith irritation and itch started about 2 days ago   eye exam    Has been requested   Subjective    Vaginal Discharge The patient's primary symptoms include vaginal discharge. The patient's pertinent negatives include no pelvic pain. Associated symptoms include abdominal pain, frequency and urgency. Pertinent negatives include no diarrhea, dysuria, fever, flank pain, nausea, rash or vomiting.   HPI     Vaginal Discharge    Additional comments: Wjith irritation and itch started about 2 days ago        eye exam    Additional comments: Has been requested      Last edited by Daveluy, Tamara A, CMA on 03/30/2022  9:19 AM.      Vaginal irritation  Noticed vaginal discharge yesterday- states there was a bit of an odor and discharge was a bit watery Reports some abdominal pain She is not sexually active  She denies symptoms like this previously Reports some sensitivity urinating after taking a bath She has not been on any antibiotics lately   Medications: Outpatient Medications Prior to Visit  Medication Sig   acetaminophen (TYLENOL) 500 MG tablet Take 2 tablets (1,000 mg total) by mouth every 6 (six) hours as needed for mild pain.   blood glucose meter kit and supplies KIT Dispense based on patient and insurance preference. Use up to four times daily as directed. (FOR ICD-9 250.00, 250.01).   Cholecalciferol (VITAMIN D-3) 1000 UNITS CAPS Take 1,000 Units by mouth daily.   fluticasone (FLONASE) 50 MCG/ACT nasal spray Place 2 sprays into both nostrils daily.   ibuprofen (ADVIL) 600 MG tablet Take 1 tablet (600 mg total) by mouth every  8 (eight) hours as needed for moderate pain.   Lancets (ONETOUCH DELICA PLUS LANCET30G) MISC USE TO CHECK BLOOD SUGAR UP TO 4 TIMES DAILY   lidocaine (XYLOCAINE) 5 % ointment Apply 1 application. topically 4 (four) times daily as needed for moderate pain or mild pain.   loratadine (CLARITIN) 10 MG tablet Take 10 mg by mouth daily.   meclizine (ANTIVERT) 25 MG tablet Take 1 tablet (25 mg total) by mouth 3 (three) times daily as needed for dizziness.   Multiple Vitamins-Minerals (MULTIVITAMIN GUMMIES ADULT PO) Take by mouth daily.   omeprazole (PRILOSEC) 20 MG capsule Take 1 capsule (20 mg total) by mouth daily.   ONETOUCH ULTRA test strip USE TO CHECK BLOOD SUGAR UP TO 4 TIMES DAILY AS DIRECTED   rosuvastatin (CRESTOR) 40 MG tablet Take 1 tablet (40 mg total) by mouth daily.   verapamil (VERELAN PM) 180 MG 24 hr capsule Take 1 capsule (180 mg total) by mouth at bedtime. (Patient taking differently: Take 180 mg by mouth daily. afternoon)   No facility-administered medications prior to visit.    Review of Systems  Constitutional:  Negative for fever.  Gastrointestinal:  Positive for abdominal pain. Negative for diarrhea, nausea and vomiting.  Genitourinary:  Positive for frequency, urgency and vaginal discharge. Negative for difficulty urinating, dysuria, flank pain, genital sores, pelvic pain, vaginal bleeding and vaginal pain.  Skin:  Negative   for rash.       Objective    BP 129/82   Pulse 74   Temp 98.5 F (36.9 C) (Oral)   Ht 5' 0.98" (1.549 m)   Wt 156 lb 1.6 oz (70.8 kg)   LMP 10/14/1992 (Approximate)   SpO2 98%   BMI 29.51 kg/m    Physical Exam Vitals reviewed.  Constitutional:      General: She is awake.     Appearance: Normal appearance. She is well-developed, well-groomed and overweight.  HENT:     Head: Normocephalic and atraumatic.  Eyes:     General: Lids are normal. Gaze aligned appropriately.     Extraocular Movements: Extraocular movements intact.      Conjunctiva/sclera: Conjunctivae normal.  Cardiovascular:     Rate and Rhythm: Normal rate and regular rhythm.     Pulses: Normal pulses.     Heart sounds: Normal heart sounds. No murmur heard.    No friction rub. No gallop.  Pulmonary:     Effort: Pulmonary effort is normal.     Breath sounds: Normal breath sounds. No decreased air movement. No decreased breath sounds, wheezing, rhonchi or rales.  Abdominal:     General: Abdomen is flat. Bowel sounds are normal.     Palpations: Abdomen is soft.     Tenderness: There is no abdominal tenderness. There is no right CVA tenderness, left CVA tenderness or guarding.  Musculoskeletal:     Cervical back: Normal range of motion.     Right lower leg: No edema.     Left lower leg: No edema.  Neurological:     Mental Status: She is alert.  Psychiatric:        Behavior: Behavior is cooperative.       No results found for any visits on 03/30/22.  Assessment & Plan      No follow-ups on file.       Problem List Items Addressed This Visit   None Visit Diagnoses     Vaginal discharge    -  Primary Acute, new concern Reports this has been ongoing since yesterday UA was concerning for potential UTI- will send for culture- results to dictate further management Wet prep was negative for BV, trich, yeast  Follow up as needed and dictated by urine culture results    Relevant Orders   WET PREP FOR TRICH, YEAST, CLUE (Completed)   Urinalysis, Routine w reflex microscopic (Completed)   Pyuria     Identified by UA Will send for culture - results to dictate further management Recommend she stay well hydrated and avoid holding urine for prolonged periods of time Follow up as needed    Relevant Orders   Urine Culture   Need for influenza vaccination       Relevant Orders   Flu Vaccine QUAD 6mo+IM (Fluarix, Fluzone & Alfiuria Quad PF) (Completed)        No follow-ups on file.   I, Erin E Mecum, PA-C, have reviewed all documentation  for this visit. The documentation on 03/30/22 for the exam, diagnosis, procedures, and orders are all accurate and complete.   Erin Mecum, MHS, PA-C [facility]     

## 2022-03-30 NOTE — Progress Notes (Deleted)
Established Patient Office Visit  Name: Linda Boyle   MRN: [id number]    DOB: 1961-06-26   Date:03/30/2022  Today's Provider: Talitha Givens, MHS, PA-C Introduced myself to the patient as a PA-C and provided education on APPs in clinical practice.         Subjective  Chief Complaint  Chief Complaint  Patient presents with   Vaginal Discharge    Wjith irritation and itch started about 2 days ago   eye exam    Has been requested    Vaginal Discharge The patient's primary symptoms include vaginal discharge. Associated symptoms include abdominal pain.         Patient Active Problem List   Diagnosis Date Noted   Internal and external hemorrhoids without complication    Anal polyp    Hepatic steatosis 11/12/2021   History of colonic polyps    Cecal polyp    Adenomatous polyp of descending colon    Elevated LFTs 09/29/2019   Type 2 diabetes mellitus with proteinuria ([place]) 09/26/2019   Type 2 diabetes mellitus with obesity ([place]) 09/13/2018   BMI 28.0-28.9,adult 09/07/2017   Atrophic vaginitis 01/26/2017   Allergic rhinitis 07/16/2015   GERD (gastroesophageal reflux disease) 07/16/2015   Hyperlipidemia associated with type 2 diabetes mellitus ([place]) 07/16/2015   Hypertension associated with diabetes ([place]) 07/16/2015    Past Surgical History:  Procedure Laterality Date   ABDOMINAL HYSTERECTOMY     partial   COLONOSCOPY WITH PROPOFOL N/A 11/02/2021   Procedure: COLONOSCOPY WITH PROPOFOL;  Surgeon: Lin Landsman, MD;  Location: [facility] ENDOSCOPY;  Service: Gastroenterology;  Laterality: N/A;   DILATION AND CURETTAGE OF UTERUS  8588,[id number]   EVALUATION UNDER ANESTHESIA WITH HEMORRHOIDECTOMY N/A 12/08/2021   Procedure: EXAM UNDER ANESTHESIA WITH HEMORRHOIDECTOMY;  Surgeon: Olean Ree, MD;  Location: [facility] ORS;  Service: General;  Laterality: N/A;  with excision of anal polyp   TOTAL VAGINAL HYSTERECTOMY  1994   Ovaries remain   TUBAL LIGATION      Family  History  Problem Relation Age of Onset   Diabetes Mother    Alzheimer's disease Father    Hypertension Father    Diabetes Sister    Hypertension Brother    Asthma Brother    Diabetes Brother    Hypertension Sister    Breast cancer Neg Hx     Social History   Tobacco Use   Smoking status: Never    Passive exposure: Never   Smokeless tobacco: Never  Substance Use Topics   Alcohol use: No     Current Outpatient Medications:    acetaminophen (TYLENOL) 500 MG tablet, Take 2 tablets (1,000 mg total) by mouth every 6 (six) hours as needed for mild pain., Disp: , Rfl:    blood glucose meter kit and supplies KIT, Dispense based on patient and insurance preference. Use up to four times daily as directed. (FOR ICD-9 250.00, 250.01)., Disp: 1 each, Rfl: 0   Cholecalciferol (VITAMIN D-3) 1000 UNITS CAPS, Take 1,000 Units by mouth daily., Disp: , Rfl:    fluticasone (FLONASE) 50 MCG/ACT nasal spray, Place 2 sprays into both nostrils daily., Disp: 48 g, Rfl: 12   ibuprofen (ADVIL) 600 MG tablet, Take 1 tablet (600 mg total) by mouth every 8 (eight) hours as needed for moderate pain., Disp: 60 tablet, Rfl: 1   Lancets (ONETOUCH DELICA PLUS XAJOIN86V) MISC, USE TO CHECK BLOOD SUGAR UP TO 4 TIMES DAILY, Disp: 100 each, Rfl:  4   lidocaine (XYLOCAINE) 5 % ointment, Apply 1 application. topically 4 (four) times daily as needed for moderate pain or mild pain., Disp: 30 g, Rfl: 0   loratadine (CLARITIN) 10 MG tablet, Take 10 mg by mouth daily., Disp: , Rfl:    meclizine (ANTIVERT) 25 MG tablet, Take 1 tablet (25 mg total) by mouth 3 (three) times daily as needed for dizziness., Disp: 30 tablet, Rfl: 0   Multiple Vitamins-Minerals (MULTIVITAMIN GUMMIES ADULT PO), Take by mouth daily., Disp: , Rfl:    omeprazole (PRILOSEC) 20 MG capsule, Take 1 capsule (20 mg total) by mouth daily., Disp: 90 capsule, Rfl: 4   ONETOUCH ULTRA test strip, USE TO CHECK BLOOD SUGAR UP TO 4 TIMES DAILY AS DIRECTED, Disp: 100  each, Rfl: 4   rosuvastatin (CRESTOR) 40 MG tablet, Take 1 tablet (40 mg total) by mouth daily., Disp: 90 tablet, Rfl: 3   verapamil (VERELAN PM) 180 MG 24 hr capsule, Take 1 capsule (180 mg total) by mouth at bedtime. (Patient taking differently: Take 180 mg by mouth daily. afternoon), Disp: 90 capsule, Rfl: 4  Allergies  Allergen Reactions   Lisinopril     cough   Losartan Cough    I personally reviewed {Reviewed:14835} with the patient/caregiver today.   Review of Systems  Gastrointestinal:  Positive for abdominal pain.  Genitourinary:  Positive for vaginal discharge.      Objective  Vitals:   03/30/22 0915  BP: 129/82  Pulse: 74  Temp: 98.5 F (36.9 C)  TempSrc: Oral  SpO2: 98%  Weight: 156 lb 1.6 oz (70.8 kg)  Height: 5' 0.98" (1.549 m)    Body mass index is 29.51 kg/m.  Physical Exam   No results found for this or any previous visit (from the past 2160 hour(s)).   PHQ2/9:    10/03/2021    9:51 AM 04/04/2021    9:14 AM 02/19/2020    3:41 PM 12/05/2019    2:10 PM 09/26/2019    9:51 AM  Depression screen PHQ 2/9  Decreased Interest 0 0 0 0 1  Down, Depressed, Hopeless 0 0 0 0 0  PHQ - 2 Score 0 0 0 0 1  Altered sleeping 1      Tired, decreased energy 0      Change in appetite 0      Feeling bad or failure about yourself  1      Trouble concentrating 0      Moving slowly or fidgety/restless 0      Suicidal thoughts 0      PHQ-9 Score 2          Fall Risk:    04/04/2021    9:30 AM 04/01/2020   10:15 AM 03/18/2020    9:46 AM 03/04/2020    9:18 AM 02/19/2020    3:41 PM  Fall Risk   Falls in the past year? 0 0 0 0 0  Number falls in past yr: 0      Injury with Fall? 0      Risk for fall due to : No Fall Risks      Follow up Falls evaluation completed          Functional Status Survey:      Assessment & Plan

## 2022-04-01 ENCOUNTER — Encounter: Payer: Self-pay | Admitting: Physician Assistant

## 2022-04-01 NOTE — Patient Instructions (Signed)
Fatty Liver Disease  The liver converts food into energy, removes toxic material from the blood, makes important proteins, and absorbs necessary vitamins from food. Fatty liver disease occurs when too much fat has built up in your liver cells. Fatty liver disease is also called hepatic steatosis. In many cases, fatty liver disease does not cause symptoms or problems. It is often diagnosed when tests are being done for other reasons. However, over time, fatty liver can cause inflammation that may lead to more serious liver problems, such as scarring of the liver (cirrhosis) and liver failure. Fatty liver is associated with insulin resistance, increased body fat, high blood pressure (hypertension), and high cholesterol. These are features of metabolic syndrome and increase your risk for stroke, diabetes, and heart disease. What are the causes? This condition may be caused by components of metabolic syndrome: Obesity. Insulin resistance. High cholesterol. Other causes: Alcohol abuse. Poor nutrition. Cushing syndrome. Pregnancy. Certain drugs. Poisons. Some viral infections. What increases the risk? You are more likely to develop this condition if you: Abuse alcohol. Are overweight. Have diabetes. Have hepatitis. Have a high triglyceride level. Are pregnant. What are the signs or symptoms? Fatty liver disease often does not cause symptoms. If symptoms do develop, they can include: Fatigue and weakness. Weight loss. Confusion. Nausea, vomiting, or abdominal pain. Yellowing of your skin and the white parts of your eyes (jaundice). Itchy skin. How is this diagnosed? This condition may be diagnosed by: A physical exam and your medical history. Blood tests. Imaging tests, such as an ultrasound, CT scan, or MRI. A liver biopsy. A small sample of liver tissue is removed using a needle. The sample is then looked at under a microscope. How is this treated? Fatty liver disease is often  caused by other health conditions. Treatment for fatty liver may involve medicines and lifestyle changes to manage conditions such as: Alcoholism. High cholesterol. Diabetes. Being overweight or obese. Follow these instructions at home:  Do not drink alcohol. If you have trouble quitting, ask your health care provider how to safely quit with the help of medicine or a supervised program. This is important to keep your condition from getting worse. Eat a healthy diet as told by your health care provider. Ask your health care provider about working with a dietitian to develop an eating plan. Exercise regularly. This can help you lose weight and control your cholesterol and diabetes. Talk to your health care provider about an exercise plan and which activities are best for you. Take over-the-counter and prescription medicines only as told by your health care provider. Keep all follow-up visits. This is important. Contact a health care provider if: You have trouble controlling your: Blood sugar. This is especially important if you have diabetes. Cholesterol. Drinking of alcohol. Get help right away if: You have abdominal pain. You have jaundice. You have nausea and are vomiting. You vomit blood or material that looks like coffee grounds. You have stools that are black, tar-like, or bloody. Summary Fatty liver disease develops when too much fat builds up in the cells of your liver. Fatty liver disease often causes no symptoms or problems. However, over time, fatty liver can cause inflammation that may lead to more serious liver problems, such as scarring of the liver (cirrhosis). You are more likely to develop this condition if you abuse alcohol, are pregnant, are overweight, have diabetes, have hepatitis, or have high triglyceride or cholesterol levels. Contact your health care provider if you have trouble controlling your blood   sugar, cholesterol, or drinking of alcohol. This information is  not intended to replace advice given to you by your health care provider. Make sure you discuss any questions you have with your health care provider. Document Revised: 04/08/2020 Document Reviewed: 04/08/2020 [employer] Patient Education  2023 [employer]  

## 2022-04-02 LAB — URINE CULTURE

## 2022-04-04 MED ORDER — AMOXICILLIN-POT CLAVULANATE 500-125 MG PO TABS: 1.0000 | ORAL_TABLET | Freq: Two times a day (BID) | ORAL | 0 refills | Status: AC

## 2022-04-04 NOTE — Addendum Note (Signed)
Addended by: Talitha Givens on: 04/04/2022 08:37 AM   Modules accepted: Orders

## 2022-04-05 ENCOUNTER — Encounter: Payer: Self-pay | Admitting: Nurse Practitioner

## 2022-04-05 ENCOUNTER — Ambulatory Visit (INDEPENDENT_AMBULATORY_CARE_PROVIDER_SITE_OTHER): Payer: 59 | Admitting: Nurse Practitioner

## 2022-04-05 VITALS — BP 121/77 | HR 73 | Temp 98.2°F | Ht 61.2 in | Wt 155.6 lb

## 2022-04-05 DIAGNOSIS — K760 Fatty (change of) liver, not elsewhere classified: Secondary | ICD-10-CM

## 2022-04-05 DIAGNOSIS — Z0000 Encounter for general adult medical examination without abnormal findings: Secondary | ICD-10-CM

## 2022-04-05 DIAGNOSIS — R809 Proteinuria, unspecified: Secondary | ICD-10-CM

## 2022-04-05 DIAGNOSIS — E669 Obesity, unspecified: Secondary | ICD-10-CM | POA: Diagnosis not present

## 2022-04-05 DIAGNOSIS — E785 Hyperlipidemia, unspecified: Secondary | ICD-10-CM | POA: Diagnosis not present

## 2022-04-05 DIAGNOSIS — E1169 Type 2 diabetes mellitus with other specified complication: Secondary | ICD-10-CM | POA: Diagnosis not present

## 2022-04-05 DIAGNOSIS — R7989 Other specified abnormal findings of blood chemistry: Secondary | ICD-10-CM | POA: Diagnosis not present

## 2022-04-05 DIAGNOSIS — Z6828 Body mass index (BMI) 28.0-28.9, adult: Secondary | ICD-10-CM

## 2022-04-05 DIAGNOSIS — K219 Gastro-esophageal reflux disease without esophagitis: Secondary | ICD-10-CM | POA: Diagnosis not present

## 2022-04-05 DIAGNOSIS — I152 Hypertension secondary to endocrine disorders: Secondary | ICD-10-CM

## 2022-04-05 DIAGNOSIS — E1159 Type 2 diabetes mellitus with other circulatory complications: Secondary | ICD-10-CM | POA: Diagnosis not present

## 2022-04-05 DIAGNOSIS — E1129 Type 2 diabetes mellitus with other diabetic kidney complication: Secondary | ICD-10-CM

## 2022-04-05 LAB — BAYER DCA HB A1C WAIVED: HB A1C (BAYER DCA - WAIVED): 6.2 % — ABNORMAL HIGH (ref 4.8–5.6)

## 2022-04-05 MED ORDER — VERAPAMIL HCL ER 180 MG PO CP24: 180.0000 mg | ORAL_CAPSULE | Freq: Every day | ORAL | 4 refills | Status: DC

## 2022-04-05 MED ORDER — ROSUVASTATIN CALCIUM 40 MG PO TABS: 40.0000 mg | ORAL_TABLET | Freq: Every day | ORAL | 4 refills | Status: DC

## 2022-04-05 NOTE — Assessment & Plan Note (Signed)
Chronic, ongoing.  Check lipid panel.  Continue Rosuvastatin as ordered, refills sent in. Fasting labs.

## 2022-04-05 NOTE — Assessment & Plan Note (Signed)
Noted on imaging 10/11/21 with some elevation in LFTs.  Will recheck labs today.  Recommend heavy focus on diet changes, which she reports she had been reading about online.  If worsening or ongoing trend up LFTs then will consider GI referral.  Diabetes currently well-controlled.

## 2022-04-05 NOTE — Progress Notes (Signed)
BP 121/77   Pulse 73   Temp 98.2 F (36.8 C) (Oral)   Ht 5' 1.2" (1.554 m)   Wt 155 lb 9.6 oz (70.6 kg)   LMP 10/14/1992 (Approximate)   SpO2 98%   BMI 29.21 kg/m    Subjective:    Patient ID: Linda Boyle, female    DOB: [birth year], 61 y.o.   MRN: [id number]  HPI: Linda Boyle is a 61 y.o. female presenting on 04/05/2022 for comprehensive medical examination. Current medical complaints include:none  She currently lives with: daughter Menopausal Symptoms: no   DIABETES Continues to be diet controlled -- last A1c 5.6% in March. Hypoglycemic episodes:no Polydipsia/polyuria: no Visual disturbance: no Chest pain: no Paresthesias: no Glucose Monitoring: yes  Accucheck frequency:  occasional  Fasting glucose:   Post prandial:  Evening:  Before meals: Taking Insulin?: no  Long acting insulin:  Short acting insulin: Blood Pressure Monitoring: a few times a week Retinal Examination: Up To Date -- [employer], 2 weeks ago Foot Exam: Up to Date Pneumovax: Up to Date Influenza: Up to Date Aspirin: no   HTN/HLD Currently taking Verapamil and Rosuvastatin.  Takes Vitamin D supplement daily for low D levels.  Has hepatic steatosis on imaging with occasional elevation in LFTs.    History of Lisinopril and Losartan.  HCTZ caused low K+.  Hypertension status: controlled  Satisfied with current treatment? yes Duration of hypertension: chronic BP monitoring frequency:  a few times a week BP range: <120-130/70 range at home BP medication side effects:  no Medication compliance: good compliance Aspirin: no Recurrent headaches: no Visual changes: no Palpitations: no Dyspnea: no Chest pain: no Lower extremity edema: no Dizzy/lightheaded: no The 10-year ASCVD risk score (Arnett DK, et al., 2019) is: 16.5%   Values used to calculate the score:     Age: 4 years     Sex: Female     Is Non-Hispanic African American: Yes     Diabetic: Yes     Tobacco  smoker: No     Systolic Blood Pressure: 798 mmHg     Is BP treated: Yes     HDL Cholesterol: 52 mg/dL     Total Cholesterol: 215 mg/dL   GERD Takes Prilosec 20 MG daily.    Takes her Flonase and Allegra consistently for allergic rhinitis. GERD control status: stable  Satisfied with current treatment? yes Heartburn frequency: none Medication side effects: no  Medication compliance: stable Dysphagia: no Odynophagia:  no Hematemesis: no Blood in stool: no EGD: yes  Depression Screen done today and results listed below:     04/05/2022    8:17 AM 03/30/2022    9:38 AM 10/03/2021    9:51 AM 04/04/2021    9:14 AM 02/19/2020    3:41 PM  Depression screen PHQ 2/9  Decreased Interest 0 1 0 0 0  Down, Depressed, Hopeless 0 0 0 0 0  PHQ - 2 Score 0 1 0 0 0  Altered sleeping 0 0 1    Tired, decreased energy 0 0 0    Change in appetite 1 1 0    Feeling bad or failure about yourself  1 0 1    Trouble concentrating 0 0 0    Moving slowly or fidgety/restless 0 0 0    Suicidal thoughts 0 0 0    PHQ-9 Score 2 2 2     Difficult doing work/chores Not difficult at all Not difficult at all  04/05/2022    8:17 AM 03/30/2022    9:38 AM 04/04/2021    9:30 AM 04/01/2020   10:15 AM 03/18/2020    9:46 AM  Fall Risk   Falls in the past year? 0 0 0 0 0  Number falls in past yr: 0 0 0    Injury with Fall? 0 0 0    Risk for fall due to : No Fall Risks No Fall Risks No Fall Risks    Follow up Falls evaluation completed Falls evaluation completed Falls evaluation completed      Functional Status Survey: Is the patient deaf or have difficulty hearing?: No Does the patient have difficulty seeing, even when wearing glasses/contacts?: No Does the patient have difficulty concentrating, remembering, or making decisions?: No Does the patient have difficulty walking or climbing stairs?: No Does the patient have difficulty dressing or bathing?: No Does the patient have difficulty doing errands alone  such as visiting a doctor's office or shopping?: No   Past Medical History:  Past Medical History:  Diagnosis Date   Allergy    Diabetes mellitus without complication ([place])    controlled with diet   GERD (gastroesophageal reflux disease)    Hepatitis    Hepatitis A   Hyperlipidemia    Hypertension    Mitral valve prolapse    Neck sprain    Vertigo     Surgical History:  Past Surgical History:  Procedure Laterality Date   ABDOMINAL HYSTERECTOMY     partial   COLONOSCOPY WITH PROPOFOL N/A 11/02/2021   Procedure: COLONOSCOPY WITH PROPOFOL;  Surgeon: Lin Landsman, MD;  Location: [facility] ENDOSCOPY;  Service: Gastroenterology;  Laterality: N/A;   DILATION AND CURETTAGE OF UTERUS  [id number]   EVALUATION UNDER ANESTHESIA WITH HEMORRHOIDECTOMY N/A 12/08/2021   Procedure: EXAM UNDER ANESTHESIA WITH HEMORRHOIDECTOMY;  Surgeon: Olean Ree, MD;  Location: [facility] ORS;  Service: General;  Laterality: N/A;  with excision of anal polyp   TOTAL VAGINAL HYSTERECTOMY  1994   Ovaries remain   TUBAL LIGATION      Medications:  Current Outpatient Medications on File Prior to Visit  Medication Sig   acetaminophen (TYLENOL) 500 MG tablet Take 2 tablets (1,000 mg total) by mouth every 6 (six) hours as needed for mild pain.   amoxicillin-clavulanate (AUGMENTIN) 500-125 MG tablet Take 1 tablet (500 mg total) by mouth in the morning and at bedtime for 7 days.   blood glucose meter kit and supplies KIT Dispense based on patient and insurance preference. Use up to four times daily as directed. (FOR ICD-9 250.00, 250.01).   Cholecalciferol (VITAMIN D-3) 1000 UNITS CAPS Take 1,000 Units by mouth daily.   fluticasone (FLONASE) 50 MCG/ACT nasal spray Place 2 sprays into both nostrils daily.   ibuprofen (ADVIL) 600 MG tablet Take 1 tablet (600 mg total) by mouth every 8 (eight) hours as needed for moderate pain.   Lancets (ONETOUCH DELICA PLUS [id number]) MISC USE TO CHECK BLOOD SUGAR UP TO 4 TIMES DAILY    loratadine (CLARITIN) 10 MG tablet Take 10 mg by mouth daily.   meclizine (ANTIVERT) 25 MG tablet Take 1 tablet (25 mg total) by mouth 3 (three) times daily as needed for dizziness.   Multiple Vitamins-Minerals (MULTIVITAMIN GUMMIES ADULT PO) Take by mouth daily.   omeprazole (PRILOSEC) 20 MG capsule Take 1 capsule (20 mg total) by mouth daily.   ONETOUCH ULTRA test strip USE TO CHECK BLOOD SUGAR UP TO 4 TIMES DAILY AS DIRECTED  No current facility-administered medications on file prior to visit.    Allergies:  Allergies  Allergen Reactions   Lisinopril     cough   Losartan Cough    Social History:  Social History   Socioeconomic History   Marital status: Widowed    Spouse name: Not on file   Number of children: 3   Years of education: Not on file   Highest education level: Not on file  Occupational History   Not on file  Tobacco Use   Smoking status: Never    Passive exposure: Never   Smokeless tobacco: Never  Vaping Use   Vaping Use: Never used  Substance and Sexual Activity   Alcohol use: No   Drug use: No   Sexual activity: Not Currently  Other Topics Concern   Not on file  Social History Narrative   Not on file   Social Determinants of Health   Financial Resource Strain: Low Risk  (04/04/2021)   Overall Financial Resource Strain (CARDIA)    Difficulty of Paying Living Expenses: Not hard at all  Food Insecurity: No Food Insecurity (04/04/2021)   Hunger Vital Sign    Worried About Running Out of Food in the Last Year: Never true    [place] in the Last Year: Never true  Transportation Needs: No Transportation Needs (04/04/2021)   PRAPARE - Hydrologist (Medical): No    Lack of Transportation (Non-Medical): No  Physical Activity: Insufficiently Active (04/04/2021)   Exercise Vital Sign    Days of Exercise per Week: 4 days    Minutes of Exercise per Session: 30 min  Stress: No Stress Concern Present (04/04/2021)   [place]    Feeling of Stress : Not at all  Social Connections: Moderately Isolated (04/04/2021)   Social Connection and Isolation Panel [NHANES]    Frequency of Communication with Friends and Family: More than three times a week    Frequency of Social Gatherings with Friends and Family: More than three times a week    Attends Religious Services: More than 4 times per year    Active Member of [employer] or Organizations: No    Attends Archivist Meetings: Never    Marital Status: Widowed  Intimate Partner Violence: Not At Risk (04/04/2021)   Humiliation, Afraid, Rape, and Kick questionnaire    Fear of Current or Ex-Partner: No    Emotionally Abused: No    Physically Abused: No    Sexually Abused: No   Social History   Tobacco Use  Smoking Status Never   Passive exposure: Never  Smokeless Tobacco Never   Social History   Substance and Sexual Activity  Alcohol Use No    Family History:  Family History  Problem Relation Age of Onset   Diabetes Mother    Alzheimer's disease Father    Hypertension Father    Diabetes Sister    Hypertension Brother    Asthma Brother    Diabetes Brother    Hypertension Sister    Breast cancer Neg Hx     Past medical history, surgical history, medications, allergies, family history and social history reviewed with patient today and changes made to appropriate areas of the chart.   Review of Systems - negative All other ROS negative except what is listed above and in the HPI.      Objective:    BP 121/77   Pulse  73   Temp 98.2 F (36.8 C) (Oral)   Ht 5' 1.2" (1.554 m)   Wt 155 lb 9.6 oz (70.6 kg)   LMP 10/14/1992 (Approximate)   SpO2 98%   BMI 29.21 kg/m   Wt Readings from Last 3 Encounters:  04/05/22 155 lb 9.6 oz (70.6 kg)  03/30/22 156 lb 1.6 oz (70.8 kg)  12/28/21 157 lb 6.4 oz (71.4 kg)    Physical Exam Vitals and nursing note reviewed.  Constitutional:       General: She is awake. She is not in acute distress.    Appearance: She is well-developed. She is not ill-appearing.  HENT:     Head: Normocephalic and atraumatic.     Right Ear: Hearing, tympanic membrane, ear canal and external ear normal. No drainage.     Left Ear: Hearing, tympanic membrane, ear canal and external ear normal. No drainage.     Nose: Nose normal.     Right Sinus: No maxillary sinus tenderness or frontal sinus tenderness.     Left Sinus: No maxillary sinus tenderness or frontal sinus tenderness.     Mouth/Throat:     Mouth: Mucous membranes are moist.     Pharynx: Oropharynx is clear. Uvula midline. No pharyngeal swelling, oropharyngeal exudate or posterior oropharyngeal erythema.  Eyes:     General: Lids are normal.        Right eye: No discharge.        Left eye: No discharge.     Extraocular Movements: Extraocular movements intact.     Conjunctiva/sclera: Conjunctivae normal.     Pupils: Pupils are equal, round, and reactive to light.     Visual Fields: Right eye visual fields normal and left eye visual fields normal.  Neck:     Thyroid: No thyromegaly.     Vascular: No carotid bruit.     Trachea: Trachea normal.  Cardiovascular:     Rate and Rhythm: Normal rate and regular rhythm.     Heart sounds: Normal heart sounds. No murmur heard.    No gallop.  Pulmonary:     Effort: Pulmonary effort is normal. No accessory muscle usage or respiratory distress.     Breath sounds: Normal breath sounds.  Chest:  Breasts:    Right: Normal.     Left: Normal.  Abdominal:     General: Bowel sounds are normal.     Palpations: Abdomen is soft. There is no hepatomegaly or splenomegaly.     Tenderness: There is no abdominal tenderness.  Musculoskeletal:        General: Normal range of motion.     Cervical back: Normal range of motion and neck supple.     Right lower leg: No edema.     Left lower leg: No edema.  Lymphadenopathy:     Head:     Right side of head: No  submental, submandibular, tonsillar, preauricular or posterior auricular adenopathy.     Left side of head: No submental, submandibular, tonsillar, preauricular or posterior auricular adenopathy.     Cervical: No cervical adenopathy.     Upper Body:     Right upper body: No supraclavicular, axillary or pectoral adenopathy.     Left upper body: No supraclavicular, axillary or pectoral adenopathy.  Skin:    General: Skin is warm and dry.     Capillary Refill: Capillary refill takes less than 2 seconds.     Findings: No rash.  Neurological:     Mental Status: She is  alert and oriented to person, place, and time.     Gait: Gait is intact.     Deep Tendon Reflexes: Reflexes are normal and symmetric.     Reflex Scores:      Brachioradialis reflexes are 2+ on the right side and 2+ on the left side.      Patellar reflexes are 2+ on the right side and 2+ on the left side. Psychiatric:        Attention and Perception: Attention normal.        Mood and Affect: Mood normal.        Speech: Speech normal.        Behavior: Behavior normal. Behavior is cooperative.        Thought Content: Thought content normal.        Judgment: Judgment normal.    Diabetic Foot Exam - Simple   Simple Foot Form Visual Inspection No deformities, no ulcerations, no other skin breakdown bilaterally: Yes Sensation Testing Intact to touch and monofilament testing bilaterally: Yes Pulse Check Posterior Tibialis and Dorsalis pulse intact bilaterally: Yes Comments     Results for orders placed or performed in visit on 03/30/22  WET PREP FOR [place], YEAST, CLUE   Specimen: Sterile Swab   Sterile Swab  Result Value Ref Range   Trichomonas Exam Negative Negative   Yeast Exam Negative Negative   Clue Cell Exam Negative Negative  Urine Culture   Specimen: Urine   UR  Result Value Ref Range   Urine Culture, Routine Final report (A)    Organism ID, Bacteria Comment (A)   Microscopic Examination   Urine  Result  Value Ref Range   WBC, UA 0-5 0 - 5 /hpf   RBC, Urine 0-2 0 - 2 /hpf   Epithelial Cells (non renal) 0-10 0 - 10 /hpf   Mucus, UA Present (A) Not Estab.   Bacteria, UA None seen None seen/Few  Urinalysis, Routine w reflex microscopic  Result Value Ref Range   Specific Gravity, UA 1.020 1.005 - 1.030   pH, UA 7.0 5.0 - 7.5   Color, UA Yellow Yellow   Appearance Ur Clear Clear   Leukocytes,UA Trace (A) Negative   Protein,UA 1+ (A) Negative/Trace   Glucose, UA Negative Negative   Ketones, UA Negative Negative   RBC, UA Trace (A) Negative   Bilirubin, UA Negative Negative   Urobilinogen, Ur 0.2 0.2 - 1.0 mg/dL   Nitrite, UA Negative Negative   Microscopic Examination See below:       Assessment & Plan:   Problem List Items Addressed This Visit       Cardiovascular and Mediastinum   Hypertension associated with diabetes ([place])    Chronic, stable with BP at goal today in office.  Does not tolerate ACE or ARB -- cough and HCTZ drops K+.  Recommend she continue to check BP at least 3 days at week at home and document.  Continue current medication regimen with Verapamil and adjust as needed, refills sent in.  DASH diet focus at home.  Labs:  CBC, CMP, TSH.  Return in 6 months for follow-up.        Relevant Medications   verapamil (VERELAN PM) 180 MG 24 hr capsule   rosuvastatin (CRESTOR) 40 MG tablet   Other Relevant Orders   Bayer DCA Hb A1c Waived   CBC with Differential/Platelet   Comprehensive metabolic panel   TSH     Digestive   GERD (gastroesophageal reflux disease)  Chronic, stable.  Continue current medication regimen and adjust as needed.  Educated on risks of long term PPI use and wishes to continue.  Check Mag level. Risks of PPI use were discussed with patient including bone loss, C. Diff diarrhea, pneumonia, infections, CKD, electrolyte abnormalities. Verbalizes understanding and chooses to continue the medication.      Relevant Orders   Magnesium   Hepatic  steatosis    Noted on imaging 10/11/21 with some elevation in LFTs.  Will recheck labs today.  Recommend heavy focus on diet changes, which she reports she had been reading about online.  If worsening or ongoing trend up LFTs then will consider GI referral.  Diabetes currently well-controlled.      Relevant Orders   Comprehensive metabolic panel     Endocrine   Hyperlipidemia associated with type 2 diabetes mellitus (HCC)    Chronic, ongoing.  Check lipid panel.  Continue Rosuvastatin as ordered, refills sent in. Fasting labs.      Relevant Medications   verapamil (VERELAN PM) 180 MG 24 hr capsule   rosuvastatin (CRESTOR) 40 MG tablet   Other Relevant Orders   Bayer DCA Hb A1c Waived   Comprehensive metabolic panel   Lipid Panel w/o Chol/HDL Ratio   Type 2 diabetes mellitus with obesity ([place])    Ongoing stable with diet only control.  A1c today 6.2%, below goal, and urine ALB 80 (March 2023), would benefit ACE or ARB, but is allergic to both.  Recommend she check BS every morning with goal <130.  Focus on diet control at home.  Return in 6 months.      Relevant Medications   rosuvastatin (CRESTOR) 40 MG tablet   Other Relevant Orders   Bayer DCA Hb A1c Waived   Type 2 diabetes mellitus with proteinuria (HCC) - Primary    Ongoing stable with diet only control.  A1c today 6.2%, well below goal, and urine ALB 80 (March 2023), would benefit ACE or ARB, but is allergic to both.  Recommend she check BS every morning with goal <130.  Focus on diet control at home.  Return in 6 months.      Relevant Medications   rosuvastatin (CRESTOR) 40 MG tablet   Other Relevant Orders   Bayer DCA Hb A1c Waived     Other   BMI 28.0-28.9,adult    BMI 29.21, slight gain.  Recommended eating smaller high protein, low fat meals more frequently and exercising 30 mins a day 5 times a week with a goal of 10-15lb weight loss in the next 3 months. Patient voiced their understanding and motivation to adhere to  these recommendations.       Elevated LFTs    Refer to hepatic steatosis plan of care.      Relevant Orders   Comprehensive metabolic panel   Other Visit Diagnoses     Encounter for annual physical exam       Annual physical today with labs and health maintenance reviewed, discussed with patient.        Follow up plan: Return in about 6 months (around 10/04/2022).   LABORATORY TESTING:  - Pap smear: not applicable -- no cervix  IMMUNIZATIONS:   - Tdap: Tetanus vaccination status reviewed: last tetanus booster within 10 years. - Influenza: Up To Date - Pneumovax: Up To Date 2022 - Prevnar: Not applicable - HPV: Not applicable - Zostavax vaccine: will look into this  SCREENING: -Mammogram: Up to date -- last 12/20/21 - Colonoscopy: Up  to date -- due 11/03/26 - Bone Density: Not applicable  -Hearing Test: Not applicable  -Spirometry: Not applicable   PATIENT COUNSELING:   Advised to take 1 mg of folate supplement per day if capable of pregnancy.   Sexuality: Discussed sexually transmitted diseases, partner selection, use of condoms, avoidance of unintended pregnancy  and contraceptive alternatives.   Advised to avoid cigarette smoking.  I discussed with the patient that most people either abstain from alcohol or drink within safe limits (<=14/week and <=4 drinks/occasion for males, <=7/weeks and <= 3 drinks/occasion for females) and that the risk for alcohol disorders and other health effects rises proportionally with the number of drinks per week and how often a drinker exceeds daily limits.  Discussed cessation/primary prevention of drug use and availability of treatment for abuse.   Diet: Encouraged to adjust caloric intake to maintain  or achieve ideal body weight, to reduce intake of dietary saturated fat and total fat, to limit sodium intake by avoiding high sodium foods and not adding table salt, and to maintain adequate dietary potassium and calcium preferably  from fresh fruits, vegetables, and low-fat dairy products.    Stressed the importance of regular exercise  Injury prevention: Discussed safety belts, safety helmets, smoke detector, smoking near bedding or upholstery.   Dental health: Discussed importance of regular tooth brushing, flossing, and dental visits.    NEXT PREVENTATIVE PHYSICAL DUE IN 1 YEAR. Return in about 6 months (around 10/04/2022).

## 2022-04-05 NOTE — Assessment & Plan Note (Signed)
Refer to hepatic steatosis plan of care.

## 2022-04-05 NOTE — Assessment & Plan Note (Signed)
Chronic, stable with BP at goal today in office.  Does not tolerate ACE or ARB -- cough and HCTZ drops K+.  Recommend she continue to check BP at least 3 days at week at home and document.  Continue current medication regimen with Verapamil and adjust as needed, refills sent in.  DASH diet focus at home.  Labs:  CBC, CMP, TSH.  Return in 6 months for follow-up.

## 2022-04-05 NOTE — Assessment & Plan Note (Signed)
Ongoing stable with diet only control.  A1c today 6.2%, well below goal, and urine ALB 80 (March 2023), would benefit ACE or ARB, but is allergic to both.  Recommend she check BS every morning with goal <130.  Focus on diet control at home.  Return in 6 months.

## 2022-04-05 NOTE — Assessment & Plan Note (Signed)
BMI 29.21, slight gain.  Recommended eating smaller high protein, low fat meals more frequently and exercising 30 mins a day 5 times a week with a goal of 10-15lb weight loss in the next 3 months. Patient voiced their understanding and motivation to adhere to these recommendations.

## 2022-04-05 NOTE — Assessment & Plan Note (Signed)
Chronic, stable.  Continue current medication regimen and adjust as needed.  Educated on risks of long term PPI use and wishes to continue.  Check Mag level. Risks of PPI use were discussed with patient including bone loss, C. Diff diarrhea, pneumonia, infections, CKD, electrolyte abnormalities. Verbalizes understanding and chooses to continue the medication.

## 2022-04-05 NOTE — Assessment & Plan Note (Signed)
Ongoing stable with diet only control.  A1c today 6.2%, below goal, and urine ALB 80 (March 2023), would benefit ACE or ARB, but is allergic to both.  Recommend she check BS every morning with goal <130.  Focus on diet control at home.  Return in 6 months.

## 2022-04-06 LAB — CBC WITH DIFFERENTIAL/PLATELET
Basophils Absolute: 0 10*3/uL (ref 0.0–0.2)
Basos: 1 %
EOS (ABSOLUTE): 0.2 10*3/uL (ref 0.0–0.4)
Eos: 3 %
Hematocrit: 41.7 % (ref 34.0–46.6)
Hemoglobin: 13.3 g/dL (ref 11.1–15.9)
Immature Grans (Abs): 0 10*3/uL (ref 0.0–0.1)
Immature Granulocytes: 0 %
Lymphocytes Absolute: 1.7 10*3/uL (ref 0.7–3.1)
Lymphs: 39 %
MCH: 28.9 pg (ref 26.6–33.0)
MCHC: 31.9 g/dL (ref 31.5–35.7)
MCV: 91 fL (ref 79–97)
Monocytes Absolute: 0.3 10*3/uL (ref 0.1–0.9)
Monocytes: 7 %
Neutrophils Absolute: 2.2 10*3/uL (ref 1.4–7.0)
Neutrophils: 50 %
Platelets: 276 10*3/uL (ref 150–450)
RBC: 4.61 x10E6/uL (ref 3.77–5.28)
RDW: 12.7 % (ref 11.7–15.4)
WBC: 4.4 10*3/uL (ref 3.4–10.8)

## 2022-04-06 LAB — COMPREHENSIVE METABOLIC PANEL
ALT: 25 IU/L (ref 0–32)
AST: 30 IU/L (ref 0–40)
Albumin/Globulin Ratio: 1.6 (ref 1.2–2.2)
Albumin: 4.1 g/dL (ref 3.9–4.9)
Alkaline Phosphatase: 78 IU/L (ref 44–121)
BUN/Creatinine Ratio: 13 (ref 12–28)
BUN: 11 mg/dL (ref 8–27)
Bilirubin Total: 0.4 mg/dL (ref 0.0–1.2)
CO2: 23 mmol/L (ref 20–29)
Calcium: 9.4 mg/dL (ref 8.7–10.3)
Chloride: 103 mmol/L (ref 96–106)
Creatinine, Ser: 0.88 mg/dL (ref 0.57–1.00)
Globulin, Total: 2.6 g/dL (ref 1.5–4.5)
Glucose: 119 mg/dL — ABNORMAL HIGH (ref 70–99)
Potassium: 3.5 mmol/L (ref 3.5–5.2)
Sodium: 141 mmol/L (ref 134–144)
Total Protein: 6.7 g/dL (ref 6.0–8.5)
eGFR: 74 mL/min/{1.73_m2} (ref >59)

## 2022-04-06 LAB — MAGNESIUM: Magnesium: 2.3 mg/dL (ref 1.6–2.3)

## 2022-04-06 LAB — LIPID PANEL W/O CHOL/HDL RATIO
Cholesterol, Total: 138 mg/dL (ref 100–199)
HDL: 42 mg/dL (ref >39)
LDL Chol Calc (NIH): 81 mg/dL (ref 0–99)
Triglycerides: 75 mg/dL (ref 0–149)
VLDL Cholesterol Cal: 15 mg/dL (ref 5–40)

## 2022-04-06 LAB — TSH: TSH: 2.36 u[IU]/mL (ref 0.450–4.500)

## 2022-04-06 NOTE — Progress Notes (Signed)
Contacted via MyChart   Good morning Linda Boyle your labs have returned and overall look great!!  I have no concerns on these at all!!  Any questions about them?  Kidney function, creatinine and eGFR, normal, as is liver function, AST and ALT.   Keep being amazing!!  Thank you for allowing me to participate in your care.  I appreciate you. Kindest regards, Keiji Melland

## 2022-04-14 ENCOUNTER — Other Ambulatory Visit: Payer: Self-pay | Admitting: Nurse Practitioner

## 2022-04-14 NOTE — Telephone Encounter (Signed)
Requested Prescriptions  Pending Prescriptions Disp Refills  . omeprazole (PRILOSEC) 20 MG capsule [Pharmacy Med Name: Omeprazole 20 MG Oral Capsule Delayed Release] 90 capsule 3    Sig: Take 1 capsule by mouth once daily     Gastroenterology: Proton Pump Inhibitors Passed - 04/14/2022 12:27 PM      Passed - Valid encounter within last 12 months    Recent Outpatient Visits          1 week ago Type 2 diabetes mellitus with proteinuria ([place])   [place], Barbaraann Faster, NP   2 weeks ago Vaginal discharge   [place], [place], PA-C   5 months ago Hepatic steatosis   [place], [place] T, NP   6 months ago Type 2 diabetes mellitus with obesity ([place])   [place], [place] T, NP   1 year ago Type 2 diabetes mellitus with obesity ([place])   [place], Barbaraann Faster, NP      Future Appointments            In 2 weeks Ralene Bathe, MD [place]   In 5 months [place], Barbaraann Faster, NP [employer]

## 2022-05-01 ENCOUNTER — Ambulatory Visit: Payer: 59 | Admitting: Dermatology

## 2022-05-01 ENCOUNTER — Encounter: Payer: Self-pay | Admitting: Dermatology

## 2022-05-01 DIAGNOSIS — L820 Inflamed seborrheic keratosis: Secondary | ICD-10-CM | POA: Diagnosis not present

## 2022-05-01 DIAGNOSIS — L821 Other seborrheic keratosis: Secondary | ICD-10-CM | POA: Diagnosis not present

## 2022-05-01 NOTE — Progress Notes (Signed)
   Follow-Up Visit   Subjective  Linda Boyle is a 61 y.o. female who presents for the following: Seborrheic Keratosis (Here for more treatment. Face/Neck. Irritated, itching). The patient has spots, moles and lesions to be evaluated, some may be new or changing and the patient has concerns that these could be cancer.  The following portions of the chart were reviewed this encounter and updated as appropriate:  Tobacco  Allergies  Meds  Problems  Med Hx  Surg Hx  Fam Hx     Review of Systems: No other skin or systemic complaints except as noted in HPI or Assessment and Plan.  Objective  Well appearing patient in no apparent distress; mood and affect are within normal limits.  A focused examination was performed including face, neck. Relevant physical exam findings are noted in the Assessment and Plan.  left neck x5, right neck x5 (10) Erythematous keratotic or waxy stuck-on papule or plaque.   Assessment & Plan   Seborrheic Keratoses - Stuck-on, waxy, tan-brown papules and/or plaques  - Benign-appearing - Discussed benign etiology and prognosis. - Observe - Call for any changes  Inflamed seborrheic keratosis (10) left neck x5, right neck x5  Symptomatic, irritating, patient would like treated.  Destruction of lesion - left neck x5, right neck x5 Complexity: simple   Destruction method comment:  Electrodesiccation Anesthesia: the lesion was anesthetized in a standard fashion   Anesthetic:  1% lidocaine w/ epinephrine 1-100,000 local infiltration Hemostasis achieved with:  electrodesiccation   Return if symptoms worsen or fail to improve.  I, Emelia Salisbury, CMA, am acting as scribe for Sarina Ser, MD. Documentation: I have reviewed the above documentation for accuracy and completeness, and I agree with the above.  Sarina Ser, MD

## 2022-05-01 NOTE — Patient Instructions (Addendum)
Wash gently with soap and water everyday.   Apply Vaseline daily until healed.   Recommend daily broad spectrum sunscreen SPF 30+ to sun-exposed areas, reapply every 2 hours as needed. Call for new or changing lesions.  Staying in the shade or wearing long sleeves, sun glasses (UVA+UVB protection) and wide brim hats (4-inch brim around the entire circumference of the hat) are also recommended for sun protection.    Due to recent changes in healthcare laws, you may see results of your pathology and/or laboratory studies on MyChart before the doctors have had a chance to review them. We understand that in some cases there may be results that are confusing or concerning to you. Please understand that not all results are received at the same time and often the doctors may need to interpret multiple results in order to provide you with the best plan of care or course of treatment. Therefore, we ask that you please give [place] 2 business days to thoroughly review all your results before contacting the office for clarification. Should we see a critical lab result, you will be contacted sooner.   If You Need Anything After Your Visit  If you have any questions or concerns for your doctor, please call our main line at [phone] and press option 4 to reach your doctor's medical assistant. If no one answers, please leave a voicemail as directed and we will return your call as soon as possible. Messages left after 4 pm will be answered the following business day.   You may also send [place] a message via [place]. We typically respond to MyChart messages within 1-2 business days.  For prescription refills, please ask your pharmacy to contact our office. Our fax number is [phone].  If you have an urgent issue when the clinic is closed that cannot wait until the next business day, you can page your doctor at the number below.    Please note that while we do our best to be available for urgent issues outside of  office hours, we are not available 24/7.   If you have an urgent issue and are unable to reach [place], you may choose to seek medical care at your doctor's office, retail clinic, urgent care center, or emergency room.  If you have a medical emergency, please immediately call 911 or go to the emergency department.  Pager Numbers  - Dr. Nehemiah Massed: [phone]  - Dr. Laurence Ferrari: [phone]  - Dr. Nicole Kindred: [phone]  In the event of inclement weather, please call our main line at [phone] for an update on the status of any delays or closures.  Dermatology Medication Tips: Please keep the boxes that topical medications come in in order to help keep track of the instructions about where and how to use these. Pharmacies typically print the medication instructions only on the boxes and not directly on the medication tubes.   If your medication is too expensive, please contact our office at [phone] option 4 or send [place] a message through [place].   We are unable to tell what your co-pay for medications will be in advance as this is different depending on your insurance coverage. However, we may be able to find a substitute medication at lower cost or fill out paperwork to get insurance to cover a needed medication.   If a prior authorization is required to get your medication covered by your insurance company, please allow [place] 1-2 business days to complete this process.  Drug prices often vary depending on  where the prescription is filled and some pharmacies may offer cheaper prices.  The website www.goodrx.com contains coupons for medications through different pharmacies. The prices here do not account for what the cost may be with help from insurance (it may be cheaper with your insurance), but the website can give you the price if you did not use any insurance.  - You can print the associated coupon and take it with your prescription to the pharmacy.  - You may also stop by our office during  regular business hours and pick up a GoodRx coupon card.  - If you need your prescription sent electronically to a different pharmacy, notify our office through [facility] or by phone at [phone] option 4.     Si Usted Necesita Algo Despus de Su Visita  Tambin puede enviarnos un mensaje a travs de Pharmacist, community. Por lo general respondemos a los mensajes de MyChart en el transcurso de 1 a 2 das hbiles.  Para renovar recetas, por favor pida a su farmacia que se ponga en contacto con nuestra oficina. Harland Dingwall de fax es [place] [phone].  Si tiene un asunto urgente cuando la clnica est cerrada y que no puede esperar hasta el siguiente da hbil, puede llamar/localizar a su doctor(a) al nmero que aparece a continuacin.   Por favor, tenga en cuenta que aunque hacemos todo lo posible para estar disponibles para asuntos urgentes fuera del horario de [place], no estamos disponibles las 24 horas del da, los 7 das de la [place].   Si tiene un problema urgente y no puede comunicarse con nosotros, puede optar por buscar atencin mdica  en el consultorio de su doctor(a), en una clnica privada, en un centro de atencin urgente o en una sala de emergencias.  Si tiene Engineering geologist, por favor llame inmediatamente al 911 o vaya a la sala de emergencias.  Nmeros de bper  - Dr. Nehemiah Massed: [phone]  - Dra. Moye: [phone]  - Dra. Nicole Kindred: [phone]  En caso de inclemencias del [place], por favor llame a Johnsie Kindred principal al [phone] para una actualizacin sobre el [place] de cualquier retraso o cierre.  Consejos para la medicacin en dermatologa: Por favor, guarde las cajas en las que vienen los medicamentos de uso tpico para ayudarle a seguir las instrucciones sobre dnde y cmo usarlos. Las farmacias generalmente imprimen las instrucciones del medicamento slo en las cajas y no directamente en los tubos del [place].   Si su medicamento es muy  caro, por favor, pngase en contacto con Zigmund Daniel llamando al [phone] y presione la opcin 4 o envenos un mensaje a travs de Pharmacist, community.   No podemos decirle cul ser su copago por los medicamentos por adelantado ya que esto es diferente dependiendo de la cobertura de su seguro. Sin embargo, es posible que podamos encontrar un medicamento sustituto a Electrical engineer un formulario para que el seguro cubra el medicamento que se considera necesario.   Si se requiere una autorizacin previa para que su compaa de seguros [place] su medicamento, por favor permtanos de 1 a 2 das hbiles para completar este proceso.  Los precios de los medicamentos varan con frecuencia dependiendo del Environmental consultant de dnde se surte la receta y alguna farmacias pueden ofrecer precios ms baratos.  El sitio web www.goodrx.com tiene cupones para medicamentos de Airline pilot. Los precios aqu no tienen en cuenta lo que podra costar con la ayuda del seguro (puede ser ms barato con su seguro), [employer]  sitio web puede darle el precio si no Field seismologist.  - Puede imprimir el cupn correspondiente y llevarlo con su receta a la farmacia.  - Tambin puede pasar por nuestra oficina durante el horario de atencin regular y Charity fundraiser una tarjeta de cupones de GoodRx.  - Si necesita que su receta se enve electrnicamente a una farmacia diferente, informe a nuestra oficina a travs de MyChart de [place] o por telfono llamando al [phone] y presione la opcin 4.

## 2022-05-07 ENCOUNTER — Encounter: Payer: Self-pay | Admitting: Dermatology

## 2022-06-07 ENCOUNTER — Other Ambulatory Visit: Payer: Self-pay | Admitting: Nurse Practitioner

## 2022-06-07 NOTE — Telephone Encounter (Signed)
Requested medication (s) are due for refill today: expired medication  Requested medication (s) are on the active medication list: yes   Last refill:  04/04/21 #48g 12 refills  Future visit scheduled: yes in 3 months   Notes to clinic:  expired medication do you want to renew Rx?     Requested Prescriptions  Pending Prescriptions Disp Refills   fluticasone (FLONASE) 50 MCG/ACT nasal spray [Pharmacy Med Name: Fluticasone Propionate 50 MCG/ACT Nasal Suspension] 48 g 0    Sig: Use 2 spray(s) in each nostril once daily     Ear, Nose, and Throat: Nasal Preparations - Corticosteroids Passed - 06/07/2022 12:29 PM      Passed - Valid encounter within last 12 months    Recent Outpatient Visits           2 months ago Type 2 diabetes mellitus with proteinuria ([place])   [place], Barbaraann Faster, NP   2 months ago Vaginal discharge   [place], Erin E, PA-C   6 months ago Hepatic steatosis   [place], [place] T, NP   8 months ago Type 2 diabetes mellitus with obesity ([place])   [place], Jolene T, NP   1 year ago Type 2 diabetes mellitus with obesity ([place])   [place], Barbaraann Faster, NP       Future Appointments             In 3 months [place], Barbaraann Faster, NP [employer]

## 2022-06-12 ENCOUNTER — Ambulatory Visit: Payer: Self-pay | Admitting: *Deleted

## 2022-06-12 NOTE — Telephone Encounter (Signed)
Summary: Dizziness   The patient called in stating she has been dizzy for 3 days. She has no other symptoms with it. She is scheduled for an appt on Thursday for a mychart video visit for right hip pain as well. Please assist patient further.       Chief Complaint: dizziness, lightheaded. Elevated BP  Symptoms: BP elevated BP at beginning of call 168/101 and then checked for BP 149/93. Dizziness with lightheaded at times "feels like gonna pass out " at times. Pressure in ears.  Frequency: 3 days  Pertinent Negatives: Patient denies chest pain no difficulty breathing, no weakness of either side of body, no blurred vision no speech issues. No headache reported has been taking prescribed HTN medication.  Disposition: '[]'$ ED /'[x]'$ Urgent Care (no appt availability in office) / '[]'$ Appointment(In office/virtual)/ '[]'$  [place] [facility]/ '[]'$ Home Care/ '[]'$ Refused Recommended Disposition /'[]'$ [place] [facility]/ '[x]'$  Follow-up with PCP Additional Notes:   No available appt within 24 hours with PCP. Recommended if sx worsen go to UC/ ED for evaluation and if fainting occurs call 911. Please advise regarding appt.         Reason for Disposition  Taking a medicine that could cause dizziness (e.g., blood pressure medications, diuretics)  Answer Assessment - Initial Assessment Questions 1. DESCRIPTION: "Describe your dizziness."     lightheadedness 2. LIGHTHEADED: "Do you feel lightheaded?" (e.g., somewhat faint, woozy, weak upon standing)     Somewhat faint 3. VERTIGO: "Do you feel like either you or the room is spinning or tilting?" (i.e. vertigo)     na 4. SEVERITY: "How bad is it?"  "Do you feel like you are going to faint?" "Can you stand and walk?"   - MILD: Feels slightly dizzy, but walking normally.   - MODERATE: Feels unsteady when walking, but not falling; interferes with normal activities (e.g., school, work).   - SEVERE: Unable to walk without falling, or requires assistance to walk  without falling; feels like passing out now.      Moderate  5. ONSET:  "When did the dizziness begin?"     3 days ago  6. AGGRAVATING FACTORS: "Does anything make it worse?" (e.g., standing, change in head position)     Change in head position 7. HEART RATE: "Can you tell me your heart rate?" "How many beats in 15 seconds?"  (Note: not all patients can do this)       Na  8. CAUSE: "What do you think is causing the dizziness?"     Not sure  9. RECURRENT SYMPTOM: "Have you had dizziness before?" If Yes, ask: "When was the last time?" "What happened that time?"     Yes vertigo  10. OTHER SYMPTOMS: "Do you have any other symptoms?" (e.g., fever, chest pain, vomiting, diarrhea, bleeding)       Dizziness lightheaded pressure in ears . 11. PREGNANCY: "Is there any chance you are pregnant?" "When was your last menstrual period?"       na  Protocols used: Dizziness - Lightheadedness-A-AH

## 2022-06-15 ENCOUNTER — Encounter: Payer: Self-pay | Admitting: Physician Assistant

## 2022-06-15 ENCOUNTER — Ambulatory Visit
Admission: RE | Admit: 2022-06-15 | Discharge: 2022-06-15 | Disposition: A | Discharge status: Home / Self Care | Payer: 59 | Source: Ambulatory Visit | Attending: Physician Assistant | Admitting: Physician Assistant

## 2022-06-15 ENCOUNTER — Other Ambulatory Visit: Payer: Self-pay | Admitting: Physician Assistant

## 2022-06-15 ENCOUNTER — Telehealth (INDEPENDENT_AMBULATORY_CARE_PROVIDER_SITE_OTHER): Payer: 59 | Admitting: Physician Assistant

## 2022-06-15 DIAGNOSIS — M25551 Pain in right hip: Secondary | ICD-10-CM

## 2022-06-15 DIAGNOSIS — M4186 Other forms of scoliosis, lumbar region: Secondary | ICD-10-CM | POA: Diagnosis not present

## 2022-06-15 NOTE — Patient Instructions (Addendum)
   Please go here for your  xray   [facility] [place], [place] [address]    You can use warm compresses to assist with your hip pain as well as over the counter NSAIDs like Ibuprofen and Aleve - use these sparingly and take them with plenty of water.  Please follow up in office so we can look at your hip in person

## 2022-06-15 NOTE — Progress Notes (Signed)
Virtual Visit via Video Note  I connected with Linda Boyle on 06/15/22 at  9:00 AM EST by a video enabled telemedicine application and verified that I am speaking with the correct person using two identifiers.  Today's Provider: Talitha Givens, MHS, PA-C Introduced myself to the patient as a PA-C and provided education on APPs in clinical practice.   Location: Patient: at home, [place], [place]  Provider: [place], [place]    I discussed the limitations of evaluation and management by telemedicine and the availability of in person appointments. The patient expressed understanding and agreed to proceed.  Chief Complaint  Patient presents with   Hip Pain    Hip pain comes and goes. Has been going on for the past month   Dizziness    Patient patients that is has resolved on it own since Monday     History of Present Illness:   Hip pain - right  Reports when she sits and then gets up she has to limp due to soreness  She reports it sometimes happens when she gets up out of bed  Onset: gradual  Duration: about a month  Location: under hip and goes down back of leg to level of mid thigh  Pain level and character:  reports pain is like a soreness that gets better as she walks around  Denies injuries or falls onto right leg  Associated symptoms: denies numbness or tingling, reports some weakness when she first gets up  Alleviating: stretching and getting up to move  Aggravating: sitting for long periods of time  Interventions: She has tried Advil and Aleve- these seem to provide relief     Review of Systems  Musculoskeletal:  Positive for joint pain and myalgias.  Neurological:  Negative for tingling and weakness.    Observations/Objective:    Due to the nature of the virtual visit, physical exam and observations are limited. Able to obtain the following observations:   Alert, oriented, Appears comfortable, in no acute distress.  No scleral  injection, no appreciated hoarseness, tachypnea, wheeze or strider. Able to maintain conversation without visible strain.  No cough appreciated during visit.    Assessment and Plan:   Problem List Items Addressed This Visit   None Visit Diagnoses     Right hip pain    -  Primary Acute, ongoing for about a month Patient reports stiffness and achy sensation during ambulation after sitting for prolonged periods Suspect osteoarthritis at this time but evaluation is limited due to virtual nature of visit Will order xrays of right hip to rule out fracture, dislocation  Recommend using NSAIDs sparingly, warm compresses, staying active throughout the day May need referral to Ortho for evaluation and potential management  Follow up as needed for persistent or progressing symptoms        Follow Up Instructions:    I discussed the assessment and treatment plan with the patient. The patient was provided an opportunity to ask questions and all were answered. The patient agreed with the plan and demonstrated an understanding of the instructions.   The patient was advised to call back or seek an in-person evaluation if the symptoms worsen or if the condition fails to improve as anticipated.  I provided 12 minutes of non-face-to-face time during this encounter.  No follow-ups on file.   I, Tajuanna Burnett E Javonne Dorko, PA-C, have reviewed all documentation for this visit. The documentation on 06/15/22 for the exam, diagnosis, procedures, and  orders are all accurate and complete.   Talitha Givens, MHS, PA-C [facility]

## 2022-09-30 NOTE — Patient Instructions (Signed)
Be Involved in Your Health Care:  Taking Medications When medications are taken as directed, they can greatly improve your health. But if they are not taken as instructed, they may not work. In some cases, not taking them correctly can be harmful. To help ensure your treatment remains effective and safe, understand your medications and how to take them.  Your lab results, notes and after visit summary will be available on My Chart. We strongly encourage you to use this feature. If lab results are abnormal the clinic will contact you with the appropriate steps. If the clinic does not contact you assume the results are satisfactory. You can always see your results on My Chart. If you have questions regarding your condition, please contact the clinic during office hours. You can also ask questions on My Chart.  We at [facility] are grateful that you chose us to provide care. We strive to provide excellent and compassionate care and are always looking for feedback. If you get a survey from the clinic please complete this.   Diabetes Mellitus Basics  Diabetes mellitus, or diabetes, is a long-term (chronic) disease. It occurs when the body does not properly use sugar (glucose) that is released from food after you eat. Diabetes mellitus may be caused by one or both of these problems: Your pancreas does not make enough of a hormone called insulin. Your body does not react in a normal way to the insulin that it makes. Insulin lets glucose enter cells in your body. This gives you energy. If you have diabetes, glucose cannot get into cells. This causes high blood glucose (hyperglycemia). How to treat and manage diabetes You may need to take insulin or other diabetes medicines daily to keep your glucose in balance. If you are prescribed insulin, you will learn how to give yourself insulin by injection. You may need to adjust the amount of insulin you take based on the foods that you eat. You will  need to check your blood glucose levels using a glucose monitor as told by your health care provider. The readings can help determine if you have low or high blood glucose. Generally, you should have these blood glucose levels: Before meals (preprandial): 80-130 mg/dL (4.4-7.2 mmol/L). After meals (postprandial): below 180 mg/dL (10 mmol/L). Hemoglobin A1c (HbA1c) level: less than 7%. Your health care provider will set treatment goals for you. Keep all follow-up visits. This is important. Follow these instructions at home: Diabetes medicines Take your diabetes medicines every day as told by your health care provider. List your diabetes medicines here: Name of medicine: ______________________________ Amount (dose): _______________ Time (a.m./p.m.): _______________ Notes: ___________________________________ Name of medicine: ______________________________ Amount (dose): _______________ Time (a.m./p.m.): _______________ Notes: ___________________________________ Name of medicine: ______________________________ Amount (dose): _______________ Time (a.m./p.m.): _______________ Notes: ___________________________________ Insulin If you use insulin, list the types of insulin you use here: Insulin type: ______________________________ Amount (dose): _______________ Time (a.m./p.m.): _______________Notes: ___________________________________ Insulin type: ______________________________ Amount (dose): _______________ Time (a.m./p.m.): _______________ Notes: ___________________________________ Insulin type: ______________________________ Amount (dose): _______________ Time (a.m./p.m.): _______________ Notes: ___________________________________ Insulin type: ______________________________ Amount (dose): _______________ Time (a.m./p.m.): _______________ Notes: ___________________________________ Insulin type: ______________________________ Amount (dose): _______________ Time (a.m./p.m.): _______________  Notes: ___________________________________ Managing blood glucose  Check your blood glucose levels using a glucose monitor as told by your health care provider. Write down the times that you check your glucose levels here: Time: _______________ Notes: ___________________________________ Time: _______________ Notes: ___________________________________ Time: _______________ Notes: ___________________________________ Time: _______________ Notes: ___________________________________ Time: _______________ Notes: ___________________________________ Time: _______________ Notes: ___________________________________    Low blood glucose Low blood glucose (hypoglycemia) is when glucose is at or below 70 mg/dL (3.9 mmol/L). Symptoms may include: Feeling: Hungry. Sweaty and clammy. Irritable or easily upset. Dizzy. Sleepy. Having: A fast heartbeat. A headache. A change in your vision. Numbness around the mouth, lips, or tongue. Having trouble with: Moving (coordination). Sleeping. Treating low blood glucose To treat low blood glucose, eat or drink something containing sugar right away. If you can think clearly and swallow safely, follow the 15:15 rule: Take 15 grams of a fast-acting carb (carbohydrate), as told by your health care provider. Some fast-acting carbs are: Glucose tablets: take 3-4 tablets. Hard candy: eat 3-5 pieces. Fruit juice: drink 4 oz (120 mL). Regular (not diet) soda: drink 4-6 oz (120-180 mL). Honey or sugar: eat 1 Tbsp (15 mL). Check your blood glucose levels 15 minutes after you take the carb. If your glucose is still at or below 70 mg/dL (3.9 mmol/L), take 15 grams of a carb again. If your glucose does not go above 70 mg/dL (3.9 mmol/L) after 3 tries, get help right away. After your glucose goes back to normal, eat a meal or a snack within 1 hour. Treating very low blood glucose If your glucose is at or below 54 mg/dL (3 mmol/L), you have very low blood glucose  (severe hypoglycemia). This is an emergency. Do not wait to see if the symptoms will go away. Get medical help right away. Call your local emergency services (911 in the [place] Do not drive yourself to the hospital. Questions to ask your health care provider Should I talk with a diabetes educator? What equipment will I need to care for myself at home? What diabetes medicines do I need? When should I take them? How often do I need to check my blood glucose levels? What number can I call if I have questions? When is my follow-up visit? Where can I find a support group for people with diabetes? Where to find more information American Diabetes Association: www.diabetes.org Association of Diabetes Care and Education Specialists: www.diabeteseducator.org Contact a health care provider if: Your blood glucose is at or above 240 mg/dL (13.3 mmol/L) for 2 days in a row. You have been sick or have had a fever for 2 days or more, and you are not getting better. You have any of these problems for more than 6 hours: You cannot eat or drink. You feel nauseous. You vomit. You have diarrhea. Get help right away if: Your blood glucose is lower than 54 mg/dL (3 mmol/L). You get confused. You have trouble thinking clearly. You have trouble breathing. These symptoms may represent a serious problem that is an emergency. Do not wait to see if the symptoms will go away. Get medical help right away. Call your local emergency services (911 in the [place] Do not drive yourself to the hospital. Summary Diabetes mellitus is a chronic disease that occurs when the body does not properly use sugar (glucose) that is released from food after you eat. Take insulin and diabetes medicines as told. Check your blood glucose every day, as often as told. Keep all follow-up visits. This is important. This information is not intended to replace advice given to you by your health care provider. Make sure you discuss any  questions you have with your health care provider. Document Revised: 10/28/2019 Document Reviewed: 10/28/2019 Elsevier Patient Education  2023 Elsevier Inc.  

## 2022-10-04 ENCOUNTER — Ambulatory Visit (INDEPENDENT_AMBULATORY_CARE_PROVIDER_SITE_OTHER): Payer: 59 | Admitting: Nurse Practitioner

## 2022-10-04 ENCOUNTER — Encounter: Payer: Self-pay | Admitting: Nurse Practitioner

## 2022-10-04 VITALS — BP 128/84 | HR 72 | Temp 98.0°F | Ht 61.18 in | Wt 153.0 lb

## 2022-10-04 DIAGNOSIS — E119 Type 2 diabetes mellitus without complications: Secondary | ICD-10-CM

## 2022-10-04 DIAGNOSIS — Z6828 Body mass index (BMI) 28.0-28.9, adult: Secondary | ICD-10-CM

## 2022-10-04 DIAGNOSIS — K760 Fatty (change of) liver, not elsewhere classified: Secondary | ICD-10-CM | POA: Diagnosis not present

## 2022-10-04 DIAGNOSIS — E785 Hyperlipidemia, unspecified: Secondary | ICD-10-CM | POA: Diagnosis not present

## 2022-10-04 DIAGNOSIS — R7989 Other specified abnormal findings of blood chemistry: Secondary | ICD-10-CM | POA: Diagnosis not present

## 2022-10-04 DIAGNOSIS — E1159 Type 2 diabetes mellitus with other circulatory complications: Secondary | ICD-10-CM | POA: Diagnosis not present

## 2022-10-04 DIAGNOSIS — I152 Hypertension secondary to endocrine disorders: Secondary | ICD-10-CM

## 2022-10-04 DIAGNOSIS — E1169 Type 2 diabetes mellitus with other specified complication: Secondary | ICD-10-CM

## 2022-10-04 DIAGNOSIS — R809 Proteinuria, unspecified: Secondary | ICD-10-CM | POA: Diagnosis not present

## 2022-10-04 DIAGNOSIS — E669 Obesity, unspecified: Secondary | ICD-10-CM

## 2022-10-04 DIAGNOSIS — E1129 Type 2 diabetes mellitus with other diabetic kidney complication: Secondary | ICD-10-CM

## 2022-10-04 LAB — MICROALBUMIN, URINE WAIVED
Creatinine, Urine Waived: 200 mg/dL (ref 10–300)
Microalb, Ur Waived: 150 mg/L — ABNORMAL HIGH (ref 0–19)

## 2022-10-04 LAB — BAYER DCA HB A1C WAIVED: HB A1C (BAYER DCA - WAIVED): 6.2 % — ABNORMAL HIGH (ref 4.8–5.6)

## 2022-10-04 NOTE — Assessment & Plan Note (Signed)
Refer to hepatic steatosis plan of care. ?

## 2022-10-04 NOTE — Assessment & Plan Note (Signed)
Chronic, stable.  BP at goal in office.  Does not tolerate ACE or ARB -- cough and HCTZ drops K+.  Recommend she continue to check BP at least 3 days at week at home and document.  Continue current medication regimen with Verapamil and adjust as needed, refills sent in.  DASH diet focus at home.  Labs:  Lipid and urine ALB.  Urine ALB 150 March 2024.  Return in 6 months.

## 2022-10-04 NOTE — Assessment & Plan Note (Signed)
Ongoing stable with diet only control.  A1c today 6.2%, well below goal, and urine ALB 150 (March 2024), would benefit ACE or ARB, but is allergic to both.  Recommend she check BS every morning with goal <130.  Focus on diet control at home.  Return in 6 months.

## 2022-10-04 NOTE — Progress Notes (Signed)
BP 128/84   Pulse 72   Temp 98 F (36.7 C) (Oral)   Ht 5' 1.18" (1.554 m)   Wt 153 lb (69.4 kg)   LMP 10/14/1992 (Approximate)   SpO2 98%   BMI 28.74 kg/m    Subjective:    Patient ID: Linda Boyle, female    DOB: Jun 19, 1961, 62 y.o.   MRN: [id number]  HPI: Linda Boyle is a 62 y.o. female  Chief Complaint  Patient presents with   Diabetes   Hypertension   DIABETES Continues to be diet controlled -- last A1c 6.2% September. Hypoglycemic episodes:no Polydipsia/polyuria: no Visual disturbance: no Chest pain: no Paresthesias: no Glucose Monitoring: yes  Accucheck frequency: once or twice a week  Fasting glucose: 91 yesterday morning  Post prandial:  Evening:  Before meals: Taking Insulin?: no  Long acting insulin:  Short acting insulin: Blood Pressure Monitoring: a few times a week Retinal Examination: Up To Date -- [employer] Foot Exam: Up to Date Pneumovax: Up to Date Influenza: Up to Date Aspirin: no   HTN/HLD Currently taking Verapamil and Rosuvastatin.  Takes Vitamin D supplement daily for low D levels.  Has hepatic steatosis on imaging with occasional elevation in LFTs.    History of Lisinopril and Losartan.  HCTZ caused low K+.  Hypertension status: controlled  Satisfied with current treatment? yes Duration of hypertension: chronic BP monitoring frequency:  a few times a week BP range: yesterday 134/84 BP medication side effects:  no Medication compliance: good compliance Aspirin: no Recurrent headaches: no Visual changes: no Palpitations: no Dyspnea: no Chest pain: no Lower extremity edema: no Dizzy/lightheaded: no The 10-year ASCVD risk score (Arnett DK, et al., 2019) is: 14.2%   Values used to calculate the score:     Age: 20 years     Sex: Female     Is Non-Hispanic African American: Yes     Diabetic: Yes     Tobacco smoker: No     Systolic Blood Pressure: 0000000 mmHg     Is BP treated: Yes     HDL Cholesterol: 42  mg/dL     Total Cholesterol: 138 mg/dL  Relevant past medical, surgical, family and social history reviewed and updated as indicated. Interim medical history since our last visit reviewed. Allergies and medications reviewed and updated.  Review of Systems  Constitutional:  Negative for activity change, appetite change, diaphoresis, fatigue and fever.  Respiratory:  Negative for cough, chest tightness and shortness of breath.   Cardiovascular:  Negative for chest pain, palpitations and leg swelling.  Gastrointestinal: Negative.   Neurological: Negative.   Psychiatric/Behavioral: Negative.     Per HPI unless specifically indicated above     Objective:    BP 128/84   Pulse 72   Temp 98 F (36.7 C) (Oral)   Ht 5' 1.18" (1.554 m)   Wt 153 lb (69.4 kg)   LMP 10/14/1992 (Approximate)   SpO2 98%   BMI 28.74 kg/m   Wt Readings from Last 3 Encounters:  10/04/22 153 lb (69.4 kg)  04/05/22 155 lb 9.6 oz (70.6 kg)  03/30/22 156 lb 1.6 oz (70.8 kg)    Physical Exam Vitals and nursing note reviewed.  Constitutional:      General: She is awake.     Appearance: She is well-developed.  HENT:     Head: Normocephalic.     Right Ear: Hearing normal.     Left Ear: Hearing normal.     Nose:  Nose normal.     Mouth/Throat:     Mouth: Mucous membranes are moist.  Eyes:     General: Lids are normal.        Right eye: No discharge.        Left eye: No discharge.     Conjunctiva/sclera: Conjunctivae normal.     Pupils: Pupils are equal, round, and reactive to light.  Neck:     Thyroid: No thyromegaly.     Vascular: No carotid bruit or JVD.  Cardiovascular:     Rate and Rhythm: Normal rate and regular rhythm.     Heart sounds: Normal heart sounds. No murmur heard.    No gallop.  Pulmonary:     Effort: Pulmonary effort is normal.     Breath sounds: Normal breath sounds.  Abdominal:     General: Bowel sounds are normal.     Palpations: Abdomen is soft. There is no hepatomegaly or  splenomegaly.  Musculoskeletal:     Cervical back: Normal range of motion and neck supple.     Right lower leg: No edema.     Left lower leg: No edema.  Lymphadenopathy:     Cervical: No cervical adenopathy.  Skin:    General: Skin is warm and dry.  Neurological:     Mental Status: She is alert and oriented to person, place, and time.  Psychiatric:        Attention and Perception: Attention normal.        Mood and Affect: Mood normal.        Behavior: Behavior normal. Behavior is cooperative.        Thought Content: Thought content normal.        Judgment: Judgment normal.    Results for orders placed or performed in visit on 04/06/22  HM DIABETES EYE EXAM  Result Value Ref Range   HM Diabetic Eye Exam No Retinopathy No Retinopathy      Assessment & Plan:   Problem List Items Addressed This Visit       Cardiovascular and Mediastinum   Hypertension associated with diabetes ([place])    Chronic, stable.  BP at goal in office.  Does not tolerate ACE or ARB -- cough and HCTZ drops K+.  Recommend she continue to check BP at least 3 days at week at home and document.  Continue current medication regimen with Verapamil and adjust as needed, refills sent in.  DASH diet focus at home.  Labs:  Lipid and urine ALB.  Urine ALB 150 March 2024.  Return in 6 months.      Relevant Orders   Bayer DCA Hb A1c Waived   Microalbumin, Urine Waived   Comprehensive metabolic panel     Digestive   Hepatic steatosis    Noted on imaging 10/11/21 with some elevation in LFTs in past.  Will recheck labs today.  Recommend heavy focus on diet changes, which she reports she had been reading about online.  If worsening or ongoing trend up LFTs then will consider GI referral.  Diabetes currently well-controlled.      Relevant Orders   Comprehensive metabolic panel     Endocrine   Hyperlipidemia associated with type 2 diabetes mellitus (HCC)    Chronic, ongoing.  Check lipid panel.  Continue Rosuvastatin as  ordered, refills up to date. Fasting labs.      Relevant Orders   Bayer DCA Hb A1c Waived   Comprehensive metabolic panel   Lipid Panel w/o Chol/HDL Ratio  Type 2 diabetes mellitus with obesity ([place]) - Primary    Ongoing stable with diet only control.  A1c today 6.2%, below goal, and urine ALB 150 (March 2024), would benefit ACE or ARB, but is allergic to both.  Recommend she check BS every morning with goal <130.  Focus on diet control at home.  Return in 6 months.      Relevant Orders   Bayer DCA Hb A1c Waived   Microalbumin, Urine Waived   Type 2 diabetes mellitus with proteinuria ([place])    Ongoing stable with diet only control.  A1c today 6.2%, well below goal, and urine ALB 150 (March 2024), would benefit ACE or ARB, but is allergic to both.  Recommend she check BS every morning with goal <130.  Focus on diet control at home.  Return in 6 months.      Relevant Orders   Bayer DCA Hb A1c Waived   Microalbumin, Urine Waived     Other   BMI 28.0-28.9,adult    BMI 28.74.  Recommended eating smaller high protein, low fat meals more frequently and exercising 30 mins a day 5 times a week with a goal of 10-15lb weight loss in the next 3 months. Patient voiced their understanding and motivation to adhere to these recommendations.       Elevated LFTs    Refer to hepatic steatosis plan of care.      Relevant Orders   Comprehensive metabolic panel     Follow up plan: Return in about 6 months (around 04/06/2023) for ANNUAL PHYSICAL.

## 2022-10-04 NOTE — Assessment & Plan Note (Signed)
BMI 28.74.  Recommended eating smaller high protein, low fat meals more frequently and exercising 30 mins a day 5 times a week with a goal of 10-15lb weight loss in the next 3 months. Patient voiced their understanding and motivation to adhere to these recommendations.

## 2022-10-04 NOTE — Assessment & Plan Note (Signed)
Noted on imaging 10/11/21 with some elevation in LFTs in past.  Will recheck labs today.  Recommend heavy focus on diet changes, which she reports she had been reading about online.  If worsening or ongoing trend up LFTs then will consider GI referral.  Diabetes currently well-controlled.

## 2022-10-04 NOTE — Assessment & Plan Note (Addendum)
Chronic, ongoing.  Check lipid panel.  Continue Rosuvastatin as ordered, refills up to date. Fasting labs.

## 2022-10-04 NOTE — Assessment & Plan Note (Signed)
Ongoing stable with diet only control.  A1c today 6.2%, below goal, and urine ALB 150 (March 2024), would benefit ACE or ARB, but is allergic to both.  Recommend she check BS every morning with goal <130.  Focus on diet control at home.  Return in 6 months.

## 2022-10-05 LAB — LIPID PANEL W/O CHOL/HDL RATIO
Cholesterol, Total: 147 mg/dL (ref 100–199)
HDL: 53 mg/dL (ref >39)
LDL Chol Calc (NIH): 76 mg/dL (ref 0–99)
Triglycerides: 100 mg/dL (ref 0–149)
VLDL Cholesterol Cal: 18 mg/dL (ref 5–40)

## 2022-10-05 LAB — COMPREHENSIVE METABOLIC PANEL
ALT: 19 IU/L (ref 0–32)
AST: 27 IU/L (ref 0–40)
Albumin/Globulin Ratio: 1.7 (ref 1.2–2.2)
Albumin: 4.1 g/dL (ref 3.9–4.9)
Alkaline Phosphatase: 66 IU/L (ref 44–121)
BUN/Creatinine Ratio: 14 (ref 12–28)
BUN: 11 mg/dL (ref 8–27)
Bilirubin Total: 0.4 mg/dL (ref 0.0–1.2)
CO2: 20 mmol/L (ref 20–29)
Calcium: 9.3 mg/dL (ref 8.7–10.3)
Chloride: 106 mmol/L (ref 96–106)
Creatinine, Ser: 0.81 mg/dL (ref 0.57–1.00)
Globulin, Total: 2.4 g/dL (ref 1.5–4.5)
Glucose: 99 mg/dL (ref 70–99)
Potassium: 3.7 mmol/L (ref 3.5–5.2)
Sodium: 143 mmol/L (ref 134–144)
Total Protein: 6.5 g/dL (ref 6.0–8.5)
eGFR: 82 mL/min/{1.73_m2} (ref >59)

## 2022-10-05 NOTE — Progress Notes (Signed)
Contacted via [place] afternoon Linda Boyle, your labs look great!!  No changes needed!! Keep being amazing!!  Thank you for allowing me to participate in your care.  I appreciate you. Kindest regards, Marguetta Windish

## 2022-12-05 ENCOUNTER — Other Ambulatory Visit: Payer: Self-pay | Admitting: Nurse Practitioner

## 2022-12-05 DIAGNOSIS — Z1231 Encounter for screening mammogram for malignant neoplasm of breast: Secondary | ICD-10-CM

## 2022-12-19 ENCOUNTER — Ambulatory Visit (INDEPENDENT_AMBULATORY_CARE_PROVIDER_SITE_OTHER): Payer: 59 | Admitting: Nurse Practitioner

## 2022-12-19 ENCOUNTER — Encounter: Payer: Self-pay | Admitting: Nurse Practitioner

## 2022-12-19 VITALS — BP 127/80 | HR 74 | Temp 98.6°F | Wt 147.6 lb

## 2022-12-19 DIAGNOSIS — M25551 Pain in right hip: Secondary | ICD-10-CM | POA: Diagnosis not present

## 2022-12-19 DIAGNOSIS — M25552 Pain in left hip: Secondary | ICD-10-CM | POA: Diagnosis not present

## 2022-12-19 MED ORDER — PREDNISONE 10 MG PO TABS
ORAL_TABLET | ORAL | 0 refills | Status: AC
Start: 2022-12-19 — End: ?

## 2022-12-19 MED ORDER — CYCLOBENZAPRINE HCL 10 MG PO TABS: 10.0000 mg | ORAL_TABLET | Freq: Three times a day (TID) | ORAL | 0 refills | Status: DC | PRN

## 2022-12-19 NOTE — Assessment & Plan Note (Signed)
Ongoing for 3 months, suspect this is coming more from lumbar spine vs hips.  Will obtain lumbar spine imaging and left hip.  Start Prednisone taper (recent A1c 6.2%), educated her on this and that it may increase glucose levels for short period.  Flexeril script sent to use as needed, recommend at bedtime only.  Do not take if driving or working.  Recommend she perform PT exercises at home and provided these to her. Plan for return in 3 weeks and if no improvement will get into physical therapy or ortho.

## 2022-12-19 NOTE — Patient Instructions (Addendum)
Low Back Sprain or Strain Rehab Ask your health care provider which exercises are safe for you. Do exercises exactly as told by your health care provider and adjust them as directed. It is normal to feel mild stretching, pulling, tightness, or discomfort as you do these exercises. Stop right away if you feel sudden pain or your pain gets worse. Do not begin these exercises until told by your health care provider. Stretching and range-of-motion exercises These exercises warm up your muscles and joints and improve the movement and flexibility of your back. These exercises also help to relieve pain, numbness, and tingling. Lumbar rotation  Lie on your back on a firm bed or the floor with your knees bent. Straighten your arms out to your sides so each arm forms a 90-degree angle (right angle) with a side of your body. Slowly move (rotate) both of your knees to one side of your body until you feel a stretch in your lower back (lumbar). Try not to let your shoulders lift off the floor. Hold this position for __________ seconds. Tense your abdominal muscles and slowly move your knees back to the starting position. Repeat this exercise on the other side of your body. Repeat __________ times. Complete this exercise __________ times a day. Single knee to chest  Lie on your back on a firm bed or the floor with both legs straight. Bend one of your knees. Use your hands to move your knee up toward your chest until you feel a gentle stretch in your lower back and buttock. Hold your leg in this position by holding on to the front of your knee. Keep your other leg as straight as possible. Hold this position for __________ seconds. Slowly return to the starting position. Repeat with your other leg. Repeat __________ times. Complete this exercise __________ times a day. Prone extension on elbows  Lie on your abdomen on a firm bed or the floor (prone position). Prop yourself up on your elbows. Use your arms  to help lift your chest up until you feel a gentle stretch in your abdomen and your lower back. This will place some of your body weight on your elbows. If this is uncomfortable, try stacking pillows under your chest. Your hips should stay down, against the surface that you are lying on. Keep your hip and back muscles relaxed. Hold this position for __________ seconds. Slowly relax your upper body and return to the starting position. Repeat __________ times. Complete this exercise __________ times a day. Strengthening exercises These exercises build strength and endurance in your back. Endurance is the ability to use your muscles for a long time, even after they get tired. Pelvic tilt This exercise strengthens the muscles that lie deep in the abdomen. Lie on your back on a firm bed or the floor with your legs extended. Bend your knees so they are pointing toward the ceiling and your feet are flat on the floor. Tighten your lower abdominal muscles to press your lower back against the floor. This motion will tilt your pelvis so your tailbone points up toward the ceiling instead of pointing to your feet or the floor. To help with this exercise, you may place a small towel under your lower back and try to push your back into the towel. Hold this position for __________ seconds. Let your muscles relax completely before you repeat this exercise. Repeat __________ times. Complete this exercise __________ times a day. Alternating arm and leg raises  Get on your hands  and knees on a firm surface. If you are on a hard floor, you may want to use padding, such as an exercise mat, to cushion your knees. Line up your arms and legs. Your hands should be directly below your shoulders, and your knees should be directly below your hips. Lift your left leg behind you. At the same time, raise your right arm and straighten it in front of you. Do not lift your leg higher than your hip. Do not lift your arm higher  than your shoulder. Keep your abdominal and back muscles tight. Keep your hips facing the ground. Do not arch your back. Keep your balance carefully, and do not hold your breath. Hold this position for __________ seconds. Slowly return to the starting position. Repeat with your right leg and your left arm. Repeat __________ times. Complete this exercise __________ times a day. Abdominal set with straight leg raise  Lie on your back on a firm bed or the floor. Bend one of your knees and keep your other leg straight. Tense your abdominal muscles and lift your straight leg up, 4-6 inches (10-15 cm) off the ground. Keep your abdominal muscles tight and hold this position for __________ seconds. Do not hold your breath. Do not arch your back. Keep it flat against the ground. Keep your abdominal muscles tense as you slowly lower your leg back to the starting position. Repeat with your other leg. Repeat __________ times. Complete this exercise __________ times a day. Single leg lower with bent knees Lie on your back on a firm bed or the floor. Tense your abdominal muscles and lift your feet off the floor, one foot at a time, so your knees and hips are bent in 90-degree angles (right angles). Your knees should be over your hips and your lower legs should be parallel to the floor. Keeping your abdominal muscles tense and your knee bent, slowly lower one of your legs so your toe touches the ground. Lift your leg back up to return to the starting position. Do not hold your breath. Do not let your back arch. Keep your back flat against the ground. Repeat with your other leg. Repeat __________ times. Complete this exercise __________ times a day. Posture and body mechanics Good posture and healthy body mechanics can help to relieve stress in your body's tissues and joints. Body mechanics refers to the movements and positions of your body while you do your daily activities. Posture is part of body  mechanics. Good posture means: Your spine is in its natural S-curve position (neutral). Your shoulders are pulled back slightly. Your head is not tipped forward (neutral). Follow these guidelines to improve your posture and body mechanics in your everyday activities. Standing  When standing, keep your spine neutral and your feet about hip-width apart. Keep a slight bend in your knees. Your ears, shoulders, and hips should line up. When you do a task in which you stand in one place for a long time, place one foot up on a stable object that is 2-4 inches (5-10 cm) high, such as a footstool. This helps keep your spine neutral. Sitting  When sitting, keep your spine neutral and keep your feet flat on the floor. Use a footrest, if necessary, and keep your thighs parallel to the floor. Avoid rounding your shoulders, and avoid tilting your head forward. When working at a desk or a computer, keep your desk at a height where your hands are slightly lower than your elbows. Slide your   chair under your desk so you are close enough to maintain good posture. When working at a computer, place your monitor at a height where you are looking straight ahead and you do not have to tilt your head forward or downward to look at the screen. Resting When lying down and resting, avoid positions that are most painful for you. If you have pain with activities such as sitting, bending, stooping, or squatting, lie in a position in which your body does not bend very much. For example, avoid curling up on your side with your arms and knees near your chest (fetal position). If you have pain with activities such as standing for a long time or reaching with your arms, lie with your spine in a neutral position and bend your knees slightly. Try the following positions: Lying on your side with a pillow between your knees. Lying on your back with a pillow under your knees. Lifting  When lifting objects, keep your feet at least  shoulder-width apart and tighten your abdominal muscles. Bend your knees and hips and keep your spine neutral. It is important to lift using the strength of your legs, not your back. Do not lock your knees straight out. Always ask for help to lift heavy or awkward objects. This information is not intended to replace advice given to you by your health care provider. Make sure you discuss any questions you have with your health care provider. Document Revised: 09/13/2020 Document Reviewed: 09/13/2020 Elsevier Patient Education  2024 [employer].

## 2022-12-19 NOTE — Progress Notes (Signed)
BP 127/80   Pulse 74   Temp 98.6 F (37 C) (Oral)   Wt 147 lb 9.6 oz (67 kg)   LMP 10/14/1992 (Approximate)   SpO2 99%   BMI 27.72 kg/m    Subjective:    Patient ID: Linda Boyle, female    DOB: [birth year], 62 y.o.   MRN: [id number]  HPI: Linda Boyle is a 62 y.o. female  Chief Complaint  Patient presents with   Hip Pain    Pt states she has been having bilateral hip pain for the last few months. States the right is worse than the left. States that the pain she experiences comes and goes, mainly feels the pain after sitting for periods of time. States it feels like she gets stiff.    HIP PAIN Has been present to both hips for 3 months, R>L.  No recent injuries or falls.  Will hip into pelvic area and at times radiates down right leg.  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  Had imaging 06/15/22 of right hip which did not note any acute issues, did note mild scoliosis lumbar spine.   Duration: months Involved hip: bilateral R>L Mechanism of injury: unknown Location: diffuse Onset: gradual  Severity: 4/10  Quality: dull, aching, and throbbing Frequency: intermittent Radiation: yes into pelvic area and down right leg at times Aggravating factors: prolonged sitting -- getting up causes pain, feels like it catches   Alleviating factors: APAP, NSAIDs, and rest Status: fluctuating Treatments attempted: rest, APAP, and aleve   Relief with NSAIDs?: moderate Weakness with weight bearing: when sits for long period, feels weak upon getting up Weakness with walking: no Paresthesias / decreased sensation: no Swelling: no Redness:no Fevers: no   Relevant past medical, surgical, family and social history reviewed and updated as indicated. Interim medical history since our last visit reviewed. Allergies and medications reviewed and updated.  Review of Systems  Constitutional:  Negative for activity change, appetite change, diaphoresis, fatigue and  unexpected weight change.  Respiratory:  Negative for cough, chest tightness, shortness of breath and wheezing.   Cardiovascular:  Negative for chest pain, palpitations and leg swelling.  Musculoskeletal:  Positive for arthralgias.  Neurological: Negative.   Psychiatric/Behavioral: Negative.     Per HPI unless specifically indicated above     Objective:    BP 127/80   Pulse 74   Temp 98.6 F (37 C) (Oral)   Wt 147 lb 9.6 oz (67 kg)   LMP 10/14/1992 (Approximate)   SpO2 99%   BMI 27.72 kg/m   Wt Readings from Last 3 Encounters:  12/19/22 147 lb 9.6 oz (67 kg)  10/04/22 153 lb (69.4 kg)  04/05/22 155 lb 9.6 oz (70.6 kg)    Physical Exam Vitals and nursing note reviewed.  Constitutional:      General: She is awake.     Appearance: She is well-developed.  HENT:     Head: Normocephalic.     Right Ear: Hearing normal.     Left Ear: Hearing normal.     Nose: Nose normal.     Mouth/Throat:     Mouth: Mucous membranes are moist.  Eyes:     General: Lids are normal.        Right eye: No discharge.        Left eye: No discharge.     Conjunctiva/sclera: Conjunctivae normal.     Pupils: Pupils are equal, round, and reactive to  light.  Neck:     Thyroid: No thyromegaly.     Vascular: No carotid bruit or JVD.  Cardiovascular:     Rate and Rhythm: Normal rate and regular rhythm.     Heart sounds: Normal heart sounds. No murmur heard.    No gallop.  Pulmonary:     Effort: Pulmonary effort is normal.     Breath sounds: Normal breath sounds.  Abdominal:     General: Bowel sounds are normal. There is no distension.     Palpations: Abdomen is soft.     Tenderness: There is no abdominal tenderness.  Musculoskeletal:     Cervical back: Normal range of motion and neck supple.     Lumbar back: Tenderness (with extension and lateral movements tenderness to right SI and gluteal area) present. No swelling, spasms or bony tenderness. Decreased range of motion (extension and lateral  right). Negative right straight leg raise test and negative left straight leg raise test.     Right hip: Normal.     Left hip: Normal.     Right lower leg: No edema.     Left lower leg: No edema.  Lymphadenopathy:     Cervical: No cervical adenopathy.  Skin:    General: Skin is warm and dry.  Neurological:     Mental Status: She is alert and oriented to person, place, and time.  Psychiatric:        Attention and Perception: Attention normal.        Mood and Affect: Mood normal.        Behavior: Behavior normal. Behavior is cooperative.        Thought Content: Thought content normal.        Judgment: Judgment normal.    Results for orders placed or performed in visit on 10/04/22  Bayer DCA Hb A1c Waived  Result Value Ref Range   HB A1C (BAYER DCA - WAIVED) 6.2 (H) 4.8 - 5.6 %  Microalbumin, Urine Waived  Result Value Ref Range   Microalb, Ur Waived 150 (H) 0 - 19 mg/L   Creatinine, Urine Waived 200 10 - 300 mg/dL   Microalb/Creat Ratio 30-300 (H) <30 mg/g  Comprehensive metabolic panel  Result Value Ref Range   Glucose 99 70 - 99 mg/dL   BUN 11 8 - 27 mg/dL   Creatinine, Ser 1.61 0.57 - 1.00 mg/dL   eGFR 82 >09 UE/AVW/0.98   BUN/Creatinine Ratio 14 12 - 28   Sodium 143 134 - 144 mmol/L   Potassium 3.7 3.5 - 5.2 mmol/L   Chloride 106 96 - 106 mmol/L   CO2 20 20 - 29 mmol/L   Calcium 9.3 8.7 - 10.3 mg/dL   Total Protein 6.5 6.0 - 8.5 g/dL   Albumin 4.1 3.9 - 4.9 g/dL   Globulin, Total 2.4 1.5 - 4.5 g/dL   Albumin/Globulin Ratio 1.7 1.2 - 2.2   Bilirubin Total 0.4 0.0 - 1.2 mg/dL   Alkaline Phosphatase 66 44 - 121 IU/L   AST 27 0 - 40 IU/L   ALT 19 0 - 32 IU/L  Lipid Panel w/o Chol/HDL Ratio  Result Value Ref Range   Cholesterol, Total 147 100 - 199 mg/dL   Triglycerides 119 0 - 149 mg/dL   HDL 53 >14 mg/dL   VLDL Cholesterol Cal 18 5 - 40 mg/dL   LDL Chol Calc (NIH) 76 0 - 99 mg/dL      Assessment & Plan:   Problem List Items Addressed  This Visit       Other    Bilateral hip pain - Primary    Ongoing for 3 months, suspect this is coming more from lumbar spine vs hips.  Will obtain lumbar spine imaging and left hip.  Start Prednisone taper (recent A1c 6.2%), educated her on this and that it may increase glucose levels for short period.  Flexeril script sent to use as needed, recommend at bedtime only.  Do not take if driving or working.  Recommend she perform PT exercises at home and provided these to her. Plan for return in 3 weeks and if no improvement will get into physical therapy or ortho.      Relevant Orders   DG Hip Unilat W OR W/O Pelvis 2-3 Views Left   DG Lumbar Spine Complete     Follow up plan: Return in about 3 weeks (around 01/09/2023) for Hip and Lower Back Pain.

## 2022-12-20 ENCOUNTER — Ambulatory Visit
Admission: RE | Admit: 2022-12-20 | Discharge: 2022-12-20 | Disposition: A | Discharge status: Home / Self Care | Payer: 59 | Source: Ambulatory Visit | Attending: Nurse Practitioner | Admitting: Nurse Practitioner

## 2022-12-20 ENCOUNTER — Ambulatory Visit
Admission: RE | Admit: 2022-12-20 | Discharge: 2022-12-20 | Disposition: A | Discharge status: Home / Self Care | Payer: 59 | Attending: Nurse Practitioner | Admitting: Nurse Practitioner

## 2022-12-20 DIAGNOSIS — M5136 Other intervertebral disc degeneration, lumbar region: Secondary | ICD-10-CM | POA: Diagnosis not present

## 2022-12-20 DIAGNOSIS — M25552 Pain in left hip: Secondary | ICD-10-CM | POA: Insufficient documentation

## 2022-12-20 DIAGNOSIS — M25551 Pain in right hip: Secondary | ICD-10-CM | POA: Insufficient documentation

## 2022-12-20 DIAGNOSIS — M545 Low back pain, unspecified: Secondary | ICD-10-CM | POA: Diagnosis not present

## 2022-12-22 ENCOUNTER — Ambulatory Visit
Admission: RE | Admit: 2022-12-22 | Discharge: 2022-12-22 | Disposition: A | Discharge status: Home / Self Care | Payer: 59 | Source: Ambulatory Visit | Attending: Nurse Practitioner | Admitting: Nurse Practitioner

## 2022-12-22 DIAGNOSIS — Z1231 Encounter for screening mammogram for malignant neoplasm of breast: Secondary | ICD-10-CM

## 2022-12-26 ENCOUNTER — Encounter: Payer: Self-pay | Admitting: Nurse Practitioner

## 2022-12-26 DIAGNOSIS — B349 Viral infection, unspecified: Secondary | ICD-10-CM | POA: Diagnosis not present

## 2022-12-26 NOTE — Progress Notes (Signed)
Resulted with hip imaging.

## 2022-12-26 NOTE — Progress Notes (Signed)
Contacted via MyChart   Good afternoon [place], your imaging has returned -- both hip and lower back are showing degenerative (arthritic) changes -- minimal to hip and mild to lower back.  I suspect this is where pain is coming from, more so lower back area.  How are you feeling?  Is Prednisone helping?  Continue to do exercises and if this is ongoing we may need to get you into ortho or physical therapy.  Any questions? Keep being amazing!!  Thank you for allowing me to participate in your care.  I appreciate you. Kindest regards, Terryn Rosenkranz

## 2022-12-26 NOTE — Progress Notes (Signed)
Contacted via MyChart   Normal mammogram, may repeat in one year:)

## 2023-01-06 NOTE — Patient Instructions (Signed)
Hip Pain The hip is the joint between the upper legs and the lower pelvis. The bones, cartilage, tendons, and muscles of your hip joint support your body and allow you to move around. Hip pain can range from a minor ache to severe pain in one or both of your hips. The pain may be felt on the inside of the hip joint near the groin, or on the outside near the buttocks and upper thigh. You may also have swelling or stiffness in your hip area. Follow these instructions at home: Managing pain, stiffness, and swelling     If told, put ice on the painful area. Put ice in a plastic bag. Place a towel between your skin and the bag. Leave the ice on for 20 minutes, 2-3 times a day. If told, apply heat to the affected area as often as told by your health care provider. Use the heat source that your provider recommends, such as a moist heat pack or a heating pad. Place a towel between your skin and the heat source. Leave the heat on for 20-30 minutes. If your skin turns bright red, remove the ice or heat right away to prevent skin damage. The risk of damage is higher if you cannot feel pain, heat, or cold. Activity Do exercises as told by your provider. Avoid activities that cause pain. General instructions  Take over-the-counter and prescription medicines only as told by your provider. Keep a journal of your symptoms. Write down: How often you have hip pain. The location of your pain. What the pain feels like. What makes the pain worse. Sleep with a pillow between your legs on your most comfortable side. Keep all follow-up visits. Your provider will monitor your pain and activity. Contact a health care provider if: You cannot put weight on your leg. Your pain or swelling gets worse after a week. It gets harder to walk. You have a fever. Get help right away if: You fall. You have a sudden increase in pain and swelling in your hip. Your hip is red or swollen or very tender to touch. This  information is not intended to replace advice given to you by your health care provider. Make sure you discuss any questions you have with your health care provider. Document Revised: 02/28/2022 Document Reviewed: 02/28/2022 Elsevier Patient Education  2024 [employer] Inc[employer]  

## 2023-01-10 ENCOUNTER — Ambulatory Visit (INDEPENDENT_AMBULATORY_CARE_PROVIDER_SITE_OTHER): Payer: 59 | Admitting: Nurse Practitioner

## 2023-01-10 ENCOUNTER — Encounter: Payer: Self-pay | Admitting: Nurse Practitioner

## 2023-01-10 VITALS — BP 119/80 | HR 76 | Wt 146.4 lb

## 2023-01-10 DIAGNOSIS — M25551 Pain in right hip: Secondary | ICD-10-CM

## 2023-01-10 DIAGNOSIS — M25552 Pain in left hip: Secondary | ICD-10-CM

## 2023-01-10 NOTE — Assessment & Plan Note (Signed)
Improved at this time, continue to monitor and to return to office if flare presents again.  Recommend stretching daily.

## 2023-01-10 NOTE — Progress Notes (Signed)
BP 119/80   Pulse 76   Wt 146 lb 6.4 oz (66.4 kg)   LMP 10/14/1992 (Approximate)   SpO2 98%   BMI 27.50 kg/m    Subjective:    Patient ID: Linda Boyle, female    DOB: [birth year], 62 y.o.   MRN: [id number]  HPI: Linda Boyle is a 62 y.o. female  Chief Complaint  Patient presents with   Back Pain    Pt states since the last appt she hasn't had any pain.   HIP PAIN Follow-up for left hip pain today.  Has been present to both hips for 3 months, R>L.  No recent injuries or falls.  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  Had imaging on 12/20/22 which noted mild degenerative changes to lower back and minimal to left hip.  Imaging 06/15/22 of right hip which did not note any acute issues, did note mild scoliosis lumbar spine.    At visit on 12/19/22 started Prednisone taper and Flexeril as needed.  She is performing PT exercises at home.  She started Prednisone, but BP elevated so she stopped.  Did not take Flexeril.  Pain has improved at this time.   Duration: months Involved hip: bilateral R>L Mechanism of injury: unknown Severity: 0/10 Aggravating factors: prolonged sitting Alleviating factors: APAP, NSAIDs, and rest Status: improved Treatments attempted: rest, APAP, and aleve   Relief with NSAIDs?: moderate Weakness with weight bearing: no Weakness with walking: no Paresthesias / decreased sensation: no Swelling: no Redness:no Fevers: no   Relevant past medical, surgical, family and social history reviewed and updated as indicated. Interim medical history since our last visit reviewed. Allergies and medications reviewed and updated.  Review of Systems  Constitutional:  Negative for activity change, appetite change, diaphoresis, fatigue and unexpected weight change.  Respiratory:  Negative for cough, chest tightness, shortness of breath and wheezing.   Cardiovascular:  Negative for chest pain, palpitations and leg swelling.   Musculoskeletal:  Negative for arthralgias.  Neurological: Negative.   Psychiatric/Behavioral: Negative.     Per HPI unless specifically indicated above     Objective:    BP 119/80   Pulse 76   Wt 146 lb 6.4 oz (66.4 kg)   LMP 10/14/1992 (Approximate)   SpO2 98%   BMI 27.50 kg/m   Wt Readings from Last 3 Encounters:  01/10/23 146 lb 6.4 oz (66.4 kg)  12/19/22 147 lb 9.6 oz (67 kg)  10/04/22 153 lb (69.4 kg)    Physical Exam Vitals and nursing note reviewed.  Constitutional:      General: She is awake.     Appearance: She is well-developed.  HENT:     Head: Normocephalic.     Right Ear: Hearing normal.     Left Ear: Hearing normal.     Nose: Nose normal.     Mouth/Throat:     Mouth: Mucous membranes are moist.  Eyes:     General: Lids are normal.        Right eye: No discharge.        Left eye: No discharge.     Conjunctiva/sclera: Conjunctivae normal.     Pupils: Pupils are equal, round, and reactive to light.  Neck:     Thyroid: No thyromegaly.     Vascular: No carotid bruit or JVD.  Cardiovascular:     Rate and Rhythm: Normal rate and regular rhythm.     Heart sounds: Normal heart  sounds. No murmur heard.    No gallop.  Pulmonary:     Effort: Pulmonary effort is normal.     Breath sounds: Normal breath sounds.  Abdominal:     General: Bowel sounds are normal. There is no distension.     Palpations: Abdomen is soft.     Tenderness: There is no abdominal tenderness.  Musculoskeletal:     Cervical back: Normal range of motion and neck supple.     Lumbar back: No swelling, spasms, tenderness or bony tenderness. Normal range of motion. Negative right straight leg raise test and negative left straight leg raise test.     Right hip: Normal.     Left hip: Normal.     Right lower leg: No edema.     Left lower leg: No edema.  Lymphadenopathy:     Cervical: No cervical adenopathy.  Skin:    General: Skin is warm and dry.  Neurological:     Mental Status: She  is alert and oriented to person, place, and time.  Psychiatric:        Attention and Perception: Attention normal.        Mood and Affect: Mood normal.        Behavior: Behavior normal. Behavior is cooperative.        Thought Content: Thought content normal.        Judgment: Judgment normal.    Results for orders placed or performed in visit on 10/04/22  Bayer DCA Hb A1c Waived  Result Value Ref Range   HB A1C (BAYER DCA - WAIVED) 6.2 (H) 4.8 - 5.6 %  Microalbumin, Urine Waived  Result Value Ref Range   Microalb, Ur Waived 150 (H) 0 - 19 mg/L   Creatinine, Urine Waived 200 10 - 300 mg/dL   Microalb/Creat Ratio 30-300 (H) <30 mg/g  Comprehensive metabolic panel  Result Value Ref Range   Glucose 99 70 - 99 mg/dL   BUN 11 8 - 27 mg/dL   Creatinine, Ser 1.61 0.57 - 1.00 mg/dL   eGFR 82 >09 UE/AVW/0.98   BUN/Creatinine Ratio 14 12 - 28   Sodium 143 134 - 144 mmol/L   Potassium 3.7 3.5 - 5.2 mmol/L   Chloride 106 96 - 106 mmol/L   CO2 20 20 - 29 mmol/L   Calcium 9.3 8.7 - 10.3 mg/dL   Total Protein 6.5 6.0 - 8.5 g/dL   Albumin 4.1 3.9 - 4.9 g/dL   Globulin, Total 2.4 1.5 - 4.5 g/dL   Albumin/Globulin Ratio 1.7 1.2 - 2.2   Bilirubin Total 0.4 0.0 - 1.2 mg/dL   Alkaline Phosphatase 66 44 - 121 IU/L   AST 27 0 - 40 IU/L   ALT 19 0 - 32 IU/L  Lipid Panel w/o Chol/HDL Ratio  Result Value Ref Range   Cholesterol, Total 147 100 - 199 mg/dL   Triglycerides 119 0 - 149 mg/dL   HDL 53 >14 mg/dL   VLDL Cholesterol Cal 18 5 - 40 mg/dL   LDL Chol Calc (NIH) 76 0 - 99 mg/dL      Assessment & Plan:   Problem List Items Addressed This Visit       Other   Bilateral hip pain - Primary    Improved at this time, continue to monitor and to return to office if flare presents again.  Recommend stretching daily.        Follow up plan: Return for as scheduled on September 30th.

## 2023-04-08 NOTE — Patient Instructions (Signed)
 Be Involved in Caring For Your Health:  Taking Medications When medications are taken as directed, they can greatly improve your health. But if they are not taken as prescribed, they may not work. In some cases, not taking them correctly can be harmful. To help ensure your treatment remains effective and safe, understand your medications and how to take them. Bring your medications to each visit for review by your provider.  Your lab results, notes, and after visit summary will be available on My Chart. We strongly encourage you to use this feature. If lab results are abnormal the clinic will contact you with the appropriate steps. If the clinic does not contact you assume the results are satisfactory. You can always view your results on My Chart. If you have questions regarding your health or results, please contact the clinic during office hours. You can also ask questions on My Chart.  We at [facility] are grateful that you chose [place] to provide your care. We strive to provide evidence-based and compassionate care and are always looking for feedback. If you get a survey from the clinic please complete this so we can hear your opinions.  Diabetes Mellitus and Nutrition, Adult When you have diabetes, or diabetes mellitus, it is very important to have healthy eating habits because your blood sugar (glucose) levels are greatly affected by what you eat and drink. Eating healthy foods in the right amounts, at about the same times every day, can help you: Manage your blood glucose. Lower your risk of heart disease. Improve your blood pressure. Reach or maintain a healthy weight. What can affect my meal plan? Every person with diabetes is different, and each person has different needs for a meal plan. Your health care provider may recommend that you work with a dietitian to make a meal plan that is best for you. Your meal plan may vary depending on factors such as: The calories you need. The  medicines you take. Your weight. Your blood glucose, blood pressure, and cholesterol levels. Your activity level. Other health conditions you have, such as heart or kidney disease. How do carbohydrates affect me? Carbohydrates, also called carbs, affect your blood glucose level more than any other type of food. Eating carbs raises the amount of glucose in your blood. It is important to know how many carbs you can safely have in each meal. This is different for every person. Your dietitian can help you calculate how many carbs you should have at each meal and for each snack. How does alcohol affect me? Alcohol can cause a decrease in blood glucose (hypoglycemia), especially if you use insulin or take certain diabetes medicines by mouth. Hypoglycemia can be a life-threatening condition. Symptoms of hypoglycemia, such as sleepiness, dizziness, and confusion, are similar to symptoms of having too much alcohol. Do not drink alcohol if: Your health care provider tells you not to drink. You are pregnant, may be pregnant, or are planning to become pregnant. If you drink alcohol: Limit how much you have to: 0-1 drink a day for women. 0-2 drinks a day for men. Know how much alcohol is in your drink. In the [place] one drink equals one 12 oz bottle of beer (355 mL), one 5 oz glass of wine (148 mL), or one 1 oz glass of hard liquor (44 mL). Keep yourself hydrated with water, diet soda, or unsweetened iced tea. Keep in mind that regular soda, juice, and other mixers may contain a lot of sugar and must  be counted as carbs. What are tips for following this plan?  Reading food labels Start by checking the serving size on the Nutrition Facts label of packaged foods and drinks. The number of calories and the amount of carbs, fats, and other nutrients listed on the label are based on one serving of the item. Many items contain more than one serving per package. Check the total grams (g) of carbs in one  serving. Check the number of grams of saturated fats and trans fats in one serving. Choose foods that have a low amount or none of these fats. Check the number of milligrams (mg) of salt (sodium) in one serving. Most people should limit total sodium intake to less than 2,300 mg per day. Always check the nutrition information of foods labeled as "low-fat" or "nonfat." These foods may be higher in added sugar or refined carbs and should be avoided. Talk to your dietitian to identify your daily goals for nutrients listed on the label. Shopping Avoid buying canned, pre-made, or processed foods. These foods tend to be high in fat, sodium, and added sugar. Shop around the outside edge of the grocery store. This is where you will most often find fresh fruits and vegetables, bulk grains, fresh meats, and fresh dairy products. Cooking Use low-heat cooking methods, such as baking, instead of high-heat cooking methods, such as deep frying. Cook using healthy oils, such as olive, canola, or sunflower oil. Avoid cooking with butter, cream, or high-fat meats. Meal planning Eat meals and snacks regularly, preferably at the same times every day. Avoid going long periods of time without eating. Eat foods that are high in fiber, such as fresh fruits, vegetables, beans, and whole grains. Eat 4-6 oz (112-168 g) of lean protein each day, such as lean meat, chicken, fish, eggs, or tofu. One ounce (oz) (28 g) of lean protein is equal to: 1 oz (28 g) of meat, chicken, or fish. 1 egg.  cup (62 g) of tofu. Eat some foods each day that contain healthy fats, such as avocado, nuts, seeds, and fish. What foods should I eat? Fruits Berries. Apples. Oranges. Peaches. Apricots. Plums. Grapes. Mangoes. Papayas. Pomegranates. Kiwi. Cherries. Vegetables Leafy greens, including lettuce, spinach, kale, chard, collard greens, mustard greens, and cabbage. Beets. Cauliflower. Broccoli. Carrots. Green beans. Tomatoes. Peppers.  Onions. Cucumbers. Brussels sprouts. Grains Whole grains, such as whole-wheat or whole-grain bread, crackers, tortillas, cereal, and pasta. Unsweetened oatmeal. Quinoa. Brown or wild rice. Meats and other proteins Seafood. Poultry without skin. Lean cuts of poultry and beef. Tofu. Nuts. Seeds. Dairy Low-fat or fat-free dairy products such as milk, yogurt, and cheese. The items listed above may not be a complete list of foods and beverages you can eat and drink. Contact a dietitian for more information. What foods should I avoid? Fruits Fruits canned with syrup. Vegetables Canned vegetables. Frozen vegetables with butter or cream sauce. Grains Refined white flour and flour products such as bread, pasta, snack foods, and cereals. Avoid all processed foods. Meats and other proteins Fatty cuts of meat. Poultry with skin. Breaded or fried meats. Processed meat. Avoid saturated fats. Dairy Full-fat yogurt, cheese, or milk. Beverages Sweetened drinks, such as soda or iced tea. The items listed above may not be a complete list of foods and beverages you should avoid. Contact a dietitian for more information. Questions to ask a health care provider Do I need to meet with a certified diabetes care and education specialist? Do I need to meet with a  dietitian? What number can I call if I have questions? When are the best times to check my blood glucose? Where to find more information: American Diabetes Association: diabetes.org Academy of Nutrition and Dietetics: eatright[employer] of Diabetes and Digestive and Kidney Diseases: [employer] Association of Diabetes Care & Education Specialists: diabeteseducator.org Summary It is important to have healthy eating habits because your blood sugar (glucose) levels are greatly affected by what you eat and drink. It is important to use alcohol carefully. A healthy meal plan will help you manage your blood glucose and lower your risk of  heart disease. Your health care provider may recommend that you work with a dietitian to make a meal plan that is best for you. This information is not intended to replace advice given to you by your health care provider. Make sure you discuss any questions you have with your health care provider. Document Revised: 01/28/2020 Document Reviewed: 01/28/2020 Elsevier Patient Education  2024 [employer].

## 2023-04-09 ENCOUNTER — Other Ambulatory Visit: Payer: Self-pay | Admitting: Nurse Practitioner

## 2023-04-09 ENCOUNTER — Ambulatory Visit (INDEPENDENT_AMBULATORY_CARE_PROVIDER_SITE_OTHER): Payer: 59 | Admitting: Nurse Practitioner

## 2023-04-09 ENCOUNTER — Encounter: Payer: Self-pay | Admitting: Nurse Practitioner

## 2023-04-09 VITALS — BP 122/77 | HR 76 | Temp 98.1°F | Ht 61.25 in | Wt 152.6 lb

## 2023-04-09 DIAGNOSIS — E1129 Type 2 diabetes mellitus with other diabetic kidney complication: Secondary | ICD-10-CM | POA: Diagnosis not present

## 2023-04-09 DIAGNOSIS — K760 Fatty (change of) liver, not elsewhere classified: Secondary | ICD-10-CM | POA: Diagnosis not present

## 2023-04-09 DIAGNOSIS — E1159 Type 2 diabetes mellitus with other circulatory complications: Secondary | ICD-10-CM | POA: Diagnosis not present

## 2023-04-09 DIAGNOSIS — J301 Allergic rhinitis due to pollen: Secondary | ICD-10-CM

## 2023-04-09 DIAGNOSIS — R809 Proteinuria, unspecified: Secondary | ICD-10-CM | POA: Diagnosis not present

## 2023-04-09 DIAGNOSIS — R7989 Other specified abnormal findings of blood chemistry: Secondary | ICD-10-CM

## 2023-04-09 DIAGNOSIS — I152 Hypertension secondary to endocrine disorders: Secondary | ICD-10-CM | POA: Diagnosis not present

## 2023-04-09 DIAGNOSIS — Z0000 Encounter for general adult medical examination without abnormal findings: Secondary | ICD-10-CM

## 2023-04-09 DIAGNOSIS — E1169 Type 2 diabetes mellitus with other specified complication: Secondary | ICD-10-CM | POA: Diagnosis not present

## 2023-04-09 DIAGNOSIS — Z23 Encounter for immunization: Secondary | ICD-10-CM

## 2023-04-09 DIAGNOSIS — E669 Obesity, unspecified: Secondary | ICD-10-CM

## 2023-04-09 DIAGNOSIS — E785 Hyperlipidemia, unspecified: Secondary | ICD-10-CM | POA: Diagnosis not present

## 2023-04-09 DIAGNOSIS — K219 Gastro-esophageal reflux disease without esophagitis: Secondary | ICD-10-CM

## 2023-04-09 DIAGNOSIS — Z6828 Body mass index (BMI) 28.0-28.9, adult: Secondary | ICD-10-CM | POA: Diagnosis not present

## 2023-04-09 MED ORDER — VERAPAMIL HCL ER 180 MG PO CP24: 180.0000 mg | ORAL_CAPSULE | Freq: Every day | ORAL | 4 refills | Status: DC

## 2023-04-09 MED ORDER — ROSUVASTATIN CALCIUM 40 MG PO TABS: 40.0000 mg | ORAL_TABLET | Freq: Every day | ORAL | 4 refills | Status: DC

## 2023-04-09 MED ORDER — OMEPRAZOLE 20 MG PO CPDR: 20.0000 mg | DELAYED_RELEASE_CAPSULE | Freq: Every day | ORAL | 4 refills | Status: DC

## 2023-04-09 NOTE — Assessment & Plan Note (Signed)
Chronic, stable.  Continue current medication regimen and adjust as needed.  Educated on risks of long term PPI use and wishes to continue.  Check Mag level annually. Risks of PPI use were discussed with patient including bone loss, C. Diff diarrhea, pneumonia, infections, CKD, electrolyte abnormalities. Verbalizes understanding and chooses to continue the medication.

## 2023-04-09 NOTE — Assessment & Plan Note (Signed)
Chronic, stable.  BP well at goal in office today.  Does not tolerate ACE or ARB -- cough and HCTZ drops K+.  Recommend she continue to check BP at least 3 days at week at home and document.  Continue current medication regimen with Verapamil and adjust as needed, refills sent in.  DASH diet focus at home.  Labs:  CBC, CMP, TSH.  Urine ALB 150 March 2024.  Return in 6 months.

## 2023-04-09 NOTE — Assessment & Plan Note (Signed)
BMI 28.60.  Recommended eating smaller high protein, low fat meals more frequently and exercising 30 mins a day 5 times a week with a goal of 10-15lb weight loss in the next 3 months. Patient voiced their understanding and motivation to adhere to these recommendations.

## 2023-04-09 NOTE — Assessment & Plan Note (Signed)
Chronic, stable.  Continue current medication regimen and adjust as needed.  Recommend avoiding triggers when possible.

## 2023-04-09 NOTE — Assessment & Plan Note (Signed)
Noted on imaging 10/11/21 with some elevation in LFTs in past.  Will recheck labs today.  Recommend heavy focus on diet changes, which she reports she had been reading about online.  If worsening or ongoing trend up LFTs then will consider GI referral.  Diabetes currently well-controlled.

## 2023-04-09 NOTE — Progress Notes (Signed)
BP 122/77   Pulse 76   Temp 98.1 F (36.7 C) (Oral)   Ht 5' 1.25" (1.556 m)   Wt 152 lb 9.6 oz (69.2 kg)   LMP 10/14/1992 (Approximate)   SpO2 98%   BMI 28.60 kg/m    Subjective:    Patient ID: Linda Boyle, female    DOB: [birth year], 62 y.o.   MRN: [id number]  HPI: Linda Boyle is a 62 y.o. female presenting on 04/09/2023 for comprehensive medical examination. Current medical complaints include:none  She currently lives with: daughter Menopausal Symptoms: no   Takes her Flonase PRN and Allegra daily consistently for allergic rhinitis.  DIABETES Continues to be diet controlled -- March A1c 6.2%.  Takes Vitamin D supplement daily for low D levels.  Hepatic steatosis on imaging in past with occasional elevation in LFTs.  No alcohol use at home. Hypoglycemic episodes:no Polydipsia/polyuria: no Visual disturbance: no Chest pain: no Paresthesias: no Glucose Monitoring: yes  Accucheck frequency: weekly  Fasting glucose: <130 on average  Post prandial:  Evening:  Before meals: Taking Insulin?: no  Long acting insulin:  Short acting insulin: Blood Pressure Monitoring: a few times a week Retinal Examination: Not Up To Date -- [employer] Foot Exam: Up to Date Pneumovax: Up to Date Influenza: Not Up To Date -- [facility] Aspirin: no   HTN/HLD Currently taking Verapamil and Rosuvastatin.    History of Lisinopril and Losartan = cough presented.  HCTZ caused low K+.  Hypertension status: controlled  Satisfied with current treatment? yes Duration of hypertension: chronic BP monitoring frequency:  not checking BP range:  BP medication side effects:  no Medication compliance: good compliance Aspirin: no Recurrent headaches: no Visual changes: no Palpitations: no Dyspnea: no Chest pain: no Lower extremity edema: no Dizzy/lightheaded: no The 10-year ASCVD risk score (Arnett DK, et al., 2019) is: 12.7%   Values used to  calculate the score:     Age: 62 years     Sex: Female     Is Non-Hispanic African American: Yes     Diabetic: Yes     Tobacco smoker: No     Systolic Blood Pressure: 122 mmHg     Is BP treated: Yes     HDL Cholesterol: 53 mg/dL     Total Cholesterol: 147 mg/dL   GERD Takes Prilosec 20 MG daily.  If she does not take this causes bloating or feeling like something stuck in throat. GERD control status: stable  Satisfied with current treatment? yes Heartburn frequency: none Medication side effects: no  Medication compliance: stable Dysphagia: no Odynophagia:  no Hematemesis: no Blood in stool: no EGD: yes  Depression Screen done today and results listed below:     04/09/2023    9:04 AM 01/10/2023    2:05 PM 12/19/2022    3:45 PM 10/04/2022    8:29 AM 04/05/2022    8:17 AM  Depression screen PHQ 2/9  Decreased Interest 0 1 1 1  0  Down, Depressed, Hopeless 0 1 1 1  0  PHQ - 2 Score 0 2 2 2  0  Altered sleeping 0 0 0 0 0  Tired, decreased energy 0 1 0 1 0  Change in appetite 1 0 1 1 1   Feeling bad or failure about yourself  1 1 1 1 1   Trouble concentrating 0 0 0 0 0  Moving slowly or fidgety/restless 0 0 0 0 0  Suicidal thoughts 0 0  0 0 0  PHQ-9 Score 2 4 4 5 2   Difficult doing work/chores Not difficult at all Not difficult at all Not difficult at all Not difficult at all Not difficult at all      04/09/2023    9:05 AM 01/10/2023    2:05 PM 12/19/2022    3:46 PM 10/04/2022    8:29 AM  GAD 7 : Generalized Anxiety Score  Nervous, Anxious, on Edge 0 0 0 0  Control/stop worrying 0 0 0 0  Worry too much - different things 0 0 1 1  Trouble relaxing 0 0 0 0  Restless 0 0 0 0  Easily annoyed or irritable 0 1 1 0  Afraid - awful might happen 0 0 0 1  Total GAD 7 Score 0 1 2 2   Anxiety Difficulty Not difficult at all Not difficult at all Not difficult at all Not difficult at all      04/09/2023    9:05 AM 01/10/2023    2:04 PM 12/19/2022    3:45 PM 10/04/2022    8:29 AM 04/05/2022     8:17 AM  Fall Risk   Falls in the past year? 0 0 0 0 0  Number falls in past yr: 0 0 0 0 0  Injury with Fall? 0 0 0 0 0  Risk for fall due to : No Fall Risks No Fall Risks No Fall Risks No Fall Risks No Fall Risks  Follow up Falls evaluation completed Falls evaluation completed Falls evaluation completed Falls evaluation completed Falls evaluation completed    Functional Status Survey: Is the patient deaf or have difficulty hearing?: No Does the patient have difficulty seeing, even when wearing glasses/contacts?: No Does the patient have difficulty concentrating, remembering, or making decisions?: No Does the patient have difficulty walking or climbing stairs?: No Does the patient have difficulty dressing or bathing?: No Does the patient have difficulty doing errands alone such as visiting a doctor's office or shopping?: No   Past Medical History:  Past Medical History:  Diagnosis Date   Allergy    Diabetes mellitus without complication (HCC)    controlled with diet   GERD (gastroesophageal reflux disease)    Hepatitis    Hepatitis A   Hyperlipidemia    Hypertension    Mitral valve prolapse    Neck sprain    Vertigo     Surgical History:  Past Surgical History:  Procedure Laterality Date   ABDOMINAL HYSTERECTOMY     partial   COLONOSCOPY WITH PROPOFOL N/A 11/02/2021   Procedure: COLONOSCOPY WITH PROPOFOL;  Surgeon: Toney Reil, MD;  Location: [facility] END[facility]COPY;  Service: Gastroenterology;  Laterality: N/A;   DILATION AND CURETTAGE OF UTERUS  [id number]   EVALUATION UNDER ANESTHESIA WITH HEMORRHOIDECTOMY N/A 12/08/2021   Procedure: EXAM UNDER ANESTHESIA WITH HEMORRHOIDECTOMY;  Surgeon: Henrene Dodge, MD;  Location: [facility] ORS;  Service: General;  Laterality: N/A;  with excision of anal polyp   TOTAL VAGINAL HYSTERECTOMY  1994   Ovaries remain   TUBAL LIGATION      Medications:  Current Outpatient Medications on File Prior to Visit  Medication Sig   Cholecalciferol  (VITAMIN D-3) 1000 UNITS CAPS Take 1,000 Units by mouth daily.   cyclobenzaprine (FLEXERIL) 10 MG tablet Take 1 tablet (10 mg total) by mouth 3 (three) times daily as needed for muscle spasms.   fluticasone (FLONASE) 50 MCG/ACT nasal spray Use 2 spray(s) in each nostril once daily   Lancets [facility]  PLUS LANCET30G) MISC USE TO CHECK BLOOD SUGAR UP TO 4 TIMES DAILY   loratadine (CLARITIN) 10 MG tablet Take 10 mg by mouth daily.   meclizine (ANTIVERT) 25 MG tablet Take 1 tablet (25 mg total) by mouth 3 (three) times daily as needed for dizziness.   Multiple Vitamins-Minerals (MULTIVITAMIN GUMMIES ADULT PO) Take by mouth daily.   ONETOUCH ULTRA test strip USE TO CHECK BLOOD SUGAR UP TO 4 TIMES DAILY AS DIRECTED   No current facility-administered medications on file prior to visit.   Allergies:  Allergies  Allergen Reactions   Lisinopril     cough   Losartan Cough    Social History:  Social History   Socioeconomic History   Marital status: Widowed    Spouse name: Not on file   Number of children: 3   Years of education: Not on file   Highest education level: Associate degree: occupational, Scientist, product/process development, or vocational program  Occupational History   Not on file  Tobacco Use   Smoking status: Never    Passive exposure: Never   Smokeless tobacco: Never  Vaping Use   Vaping status: Never Used  Substance and Sexual Activity   Alcohol use: No   Drug use: No   Sexual activity: Not Currently  Other Topics Concern   Not on file  Social History Narrative   Not on file   Social Determinants of Health   Financial Resource Strain: Medium Risk (10/03/2022)   Overall Financial Resource Strain (CARDIA)    Difficulty of Paying Living Expenses: Somewhat hard  Food Insecurity: No Food Insecurity (10/03/2022)   Hunger Vital Sign    Worried About Running Out of Food in the Last Year: Never true    Ran Out of Food in the Last Year: Never true  Transportation Needs: No Transportation  Needs (10/03/2022)   PRAPARE - Administrator, Civil Service (Medical): No    Lack of Transportation (Non-Medical): No  Physical Activity: Sufficiently Active (10/03/2022)   Exercise Vital Sign    Days of Exercise per Week: 3 days    Minutes of Exercise per Session: 60 min  Stress: No Stress Concern Present (10/03/2022)   [employer] of Occupational Health - Occupational Stress Questionnaire    Feeling of Stress : Only a little  Social Connections: Moderately Integrated (10/03/2022)   Social Connection and Isolation Panel [NHANES]    Frequency of Communication with Friends and Family: Twice a week    Frequency of Social Gatherings with Friends and Family: Once a week    Attends Religious Services: More than 4 times per year    Active Member of [employer] or Organizations: Yes    Attends Banker Meetings: 1 to 4 times per year    Marital Status: Widowed  Intimate Partner Violence: Not At Risk (04/04/2021)   Humiliation, Afraid, Rape, and Kick questionnaire    Fear of Current or Ex-Partner: No    Emotionally Abused: No    Physically Abused: No    Sexually Abused: No   Social History   Tobacco Use  Smoking Status Never   Passive exposure: Never  Smokeless Tobacco Never   Social History   Substance and Sexual Activity  Alcohol Use No   Family History:  Family History  Problem Relation Age of Onset   Diabetes Mother    Alzheimer's disease Father    Hypertension Father    Diabetes Sister    Hypertension Brother    Asthma Brother  Diabetes Brother    Hypertension Sister    Breast cancer Neg Hx    Past medical history, surgical history, medications, allergies, family history and social history reviewed with patient today and changes made to appropriate areas of the chart.   Review of Systems - negative All other ROS negative except what is listed above and in the HPI.      Objective:    BP 122/77   Pulse 76   Temp 98.1 F (36.7 C) (Oral)    Ht 5' 1.25" (1.556 m)   Wt 152 lb 9.6 oz (69.2 kg)   LMP 10/14/1992 (Approximate)   SpO2 98%   BMI 28.60 kg/m   Wt Readings from Last 3 Encounters:  04/09/23 152 lb 9.6 oz (69.2 kg)  01/10/23 146 lb 6.4 oz (66.4 kg)  12/19/22 147 lb 9.6 oz (67 kg)    Physical Exam Vitals and nursing note reviewed. Exam conducted with a chaperone present.  Constitutional:      General: She is awake. She is not in acute distress.    Appearance: She is well-developed and well-groomed. She is not ill-appearing or toxic-appearing.  HENT:     Head: Normocephalic and atraumatic.     Right Ear: Hearing, tympanic membrane, ear canal and external ear normal. No drainage.     Left Ear: Hearing, tympanic membrane, ear canal and external ear normal. No drainage.     Nose: Nose normal.     Right Sinus: No maxillary sinus tenderness or frontal sinus tenderness.     Left Sinus: No maxillary sinus tenderness or frontal sinus tenderness.     Mouth/Throat:     Mouth: Mucous membranes are moist.     Pharynx: Oropharynx is clear. Uvula midline. No pharyngeal swelling, oropharyngeal exudate or posterior oropharyngeal erythema.  Eyes:     General: Lids are normal.        Right eye: No discharge.        Left eye: No discharge.     Extraocular Movements: Extraocular movements intact.     Conjunctiva/sclera: Conjunctivae normal.     Pupils: Pupils are equal, round, and reactive to light.     Visual Fields: Right eye visual fields normal and left eye visual fields normal.  Neck:     Thyroid: No thyromegaly.     Vascular: No carotid bruit.     Trachea: Trachea normal.  Cardiovascular:     Rate and Rhythm: Normal rate and regular rhythm.     Heart sounds: Normal heart sounds. No murmur heard.    No gallop.  Pulmonary:     Effort: Pulmonary effort is normal. No accessory muscle usage or respiratory distress.     Breath sounds: Normal breath sounds.  Chest:  Breasts:    Right: Normal.     Left: Normal.  Abdominal:      General: Bowel sounds are normal.     Palpations: Abdomen is soft. There is no hepatomegaly or splenomegaly.     Tenderness: There is no abdominal tenderness.  Musculoskeletal:        General: Normal range of motion.     Cervical back: Normal range of motion and neck supple.     Right lower leg: No edema.     Left lower leg: No edema.  Lymphadenopathy:     Head:     Right side of head: No submental, submandibular, tonsillar, preauricular or posterior auricular adenopathy.     Left side of head: No submental, submandibular, tonsillar,  preauricular or posterior auricular adenopathy.     Cervical: No cervical adenopathy.     Upper Body:     Right upper body: No supraclavicular, axillary or pectoral adenopathy.     Left upper body: No supraclavicular, axillary or pectoral adenopathy.  Skin:    General: Skin is warm and dry.     Capillary Refill: Capillary refill takes less than 2 seconds.     Findings: No rash.  Neurological:     Mental Status: She is alert and oriented to person, place, and time.     Gait: Gait is intact.     Deep Tendon Reflexes: Reflexes are normal and symmetric.     Reflex Scores:      Brachioradialis reflexes are 2+ on the right side and 2+ on the left side.      Patellar reflexes are 2+ on the right side and 2+ on the left side. Psychiatric:        Attention and Perception: Attention normal.        Mood and Affect: Mood normal.        Speech: Speech normal.        Behavior: Behavior normal. Behavior is cooperative.        Thought Content: Thought content normal.        Judgment: Judgment normal.    Diabetic Foot Exam - Simple   Simple Foot Form Visual Inspection No deformities, no ulcerations, no other skin breakdown bilaterally: Yes Sensation Testing Intact to touch and monofilament testing bilaterally: Yes Pulse Check Posterior Tibialis and Dorsalis pulse intact bilaterally: Yes Comments     Results for orders placed or performed in visit on  10/04/22  Bayer DCA Hb A1c Waived  Result Value Ref Range   HB A1C (BAYER DCA - WAIVED) 6.2 (H) 4.8 - 5.6 %  Microalbumin, Urine Waived  Result Value Ref Range   Microalb, Ur Waived 150 (H) 0 - 19 mg/L   Creatinine, Urine Waived 200 10 - 300 mg/dL   Microalb/Creat Ratio 30-300 (H) <30 mg/g  Comprehensive metabolic panel  Result Value Ref Range   Glucose 99 70 - 99 mg/dL   BUN 11 8 - 27 mg/dL   Creatinine, Ser 1.61 0.57 - 1.00 mg/dL   eGFR 82 >09 UE/AVW/0.98   BUN/Creatinine Ratio 14 12 - 28   Sodium 143 134 - 144 mmol/L   Potassium 3.7 3.5 - 5.2 mmol/L   Chloride 106 96 - 106 mmol/L   CO2 20 20 - 29 mmol/L   Calcium 9.3 8.7 - 10.3 mg/dL   Total Protein 6.5 6.0 - 8.5 g/dL   Albumin 4.1 3.9 - 4.9 g/dL   Globulin, Total 2.4 1.5 - 4.5 g/dL   Albumin/Globulin Ratio 1.7 1.2 - 2.2   Bilirubin Total 0.4 0.0 - 1.2 mg/dL   Alkaline Phosphatase 66 44 - 121 IU/L   AST 27 0 - 40 IU/L   ALT 19 0 - 32 IU/L  Lipid Panel w/o Chol/HDL Ratio  Result Value Ref Range   Cholesterol, Total 147 100 - 199 mg/dL   Triglycerides 119 0 - 149 mg/dL   HDL 53 >14 mg/dL   VLDL Cholesterol Cal 18 5 - 40 mg/dL   LDL Chol Calc (NIH) 76 0 - 99 mg/dL      Assessment & Plan:   Problem List Items Addressed This Visit       Cardiovascular and Mediastinum   Hypertension associated with diabetes (HCC)    Chronic, stable.  BP well at goal in office today.  Does not tolerate ACE or ARB -- cough and HCTZ drops K+.  Recommend she continue to check BP at least 3 days at week at home and document.  Continue current medication regimen with Verapamil and adjust as needed, refills sent in.  DASH diet focus at home.  Labs:  CBC, CMP, TSH.  Urine ALB 150 March 2024.  Return in 6 months.      Relevant Medications   rosuvastatin (CRESTOR) 40 MG tablet   verapamil (VERELAN) 180 MG 24 hr capsule   Other Relevant Orders   CBC with Differential/Platelet   Comprehensive metabolic panel   TSH   HgB A1c     Respiratory    Allergic rhinitis    Chronic, stable.  Continue current medication regimen and adjust as needed.  Recommend avoiding triggers when possible.        Digestive   GERD (gastroesophageal reflux disease)    Chronic, stable.  Continue current medication regimen and adjust as needed.  Educated on risks of long term PPI use and wishes to continue.  Check Mag level annually. Risks of PPI use were discussed with patient including bone loss, C. Diff diarrhea, pneumonia, infections, CKD, electrolyte abnormalities. Verbalizes understanding and chooses to continue the medication.      Relevant Medications   omeprazole (PRILOSEC) 20 MG capsule   Other Relevant Orders   Magnesium   Hepatic steatosis    Noted on imaging 10/11/21 with some elevation in LFTs in past.  Will recheck labs today.  Recommend heavy focus on diet changes, which she reports she had been reading about online.  If worsening or ongoing trend up LFTs then will consider GI referral.  Diabetes currently well-controlled.      Relevant Orders   Comprehensive metabolic panel     Endocrine   Hyperlipidemia associated with type 2 diabetes mellitus (HCC)    Chronic, ongoing.  Check lipid panel.  Continue Rosuvastatin as ordered, refills sent in. Fasting labs.      Relevant Medications   rosuvastatin (CRESTOR) 40 MG tablet   verapamil (VERELAN) 180 MG 24 hr capsule   Other Relevant Orders   Comprehensive metabolic panel   Lipid Panel w/o Chol/HDL Ratio   HgB A1c   Type 2 diabetes mellitus with obesity (HCC) - Primary    Ongoing stable with diet only control.  A1c 6.2% last visit, well below goal, and urine ALB 150 (March 2024), would benefit ACE or ARB, but is allergic to both.  Recheck A1c today and adjust plan of care as needed.  Recommend she check BS every morning with goal <130.  Focus on diet control at home.  Return in 6 months. - Eye exam needed, she will schedule.  Foot exam up to date. - [facility],  Pneumococcal up to date. - Statin on board.      Relevant Medications   rosuvastatin (CRESTOR) 40 MG tablet   Other Relevant Orders   Comprehensive metabolic panel   HgB A1c   Type 2 diabetes mellitus with proteinuria (HCC)    Ongoing stable with diet only control.  A1c 6.2% last visit, well below goal, and urine ALB 150 (March 2024), would benefit ACE or ARB, but is allergic to both.  Recheck A1c today and adjust plan of care as needed.  Recommend she check BS every morning with goal <130.  Focus on diet control at home.  Return in 6 months. -  Eye exam needed, she will schedule.  Foot exam up to date. - [facility], Pneumococcal up to date. - Statin on board.      Relevant Medications   rosuvastatin (CRESTOR) 40 MG tablet   Other Relevant Orders   Comprehensive metabolic panel   HgB A1c     Other   BMI 28.0-28.9,adult    BMI 28.60.  Recommended eating smaller high protein, low fat meals more frequently and exercising 30 mins a day 5 times a week with a goal of 10-15lb weight loss in the next 3 months. Patient voiced their understanding and motivation to adhere to these recommendations.       Elevated LFTs    Refer to hepatic steatosis plan of care.      Relevant Orders   Comprehensive metabolic panel   Other Visit Diagnoses     Encounter for annual physical exam       Annual physical today with labs and health maintenance reviewed, discussed with patient.        Follow up plan: Return in about 6 months (around 10/07/2023) for T2DM, HTN/HLD, GERD.   LABORATORY TESTING:  - Pap smear: not applicable -- no cervix  IMMUNIZATIONS:   - Tdap: Tetanus vaccination status reviewed: last tetanus booster within 10 years. - Influenza: [facility], is [facility] with [facility] - Pneumovax: Up To Date in 2022 - Prevnar: Not applicable - HPV: Not applicable - Zostavax vaccine: Not Up To Date  SCREENING: -Mammogram: Up to date -- last 12/22/22 -  Colonoscopy: Up to date -- due 11/03/26 - Bone Density: Not applicable  -Hearing Test: Not applicable  -Spirometry: Not applicable   PATIENT COUNSELING:   Advised to take 1 mg of folate supplement per day if capable of pregnancy.   Sexuality: Discussed sexually transmitted diseases, partner selection, use of condoms, avoidance of unintended pregnancy  and contraceptive alternatives.   Advised to avoid cigarette smoking.  I discussed with the patient that most people either abstain from alcohol or drink within safe limits (<=14/week and <=4 drinks/occasion for males, <=7/weeks and <= 3 drinks/occasion for females) and that the risk for alcohol disorders and other health effects rises proportionally with the number of drinks per week and how often a drinker exceeds daily limits.  Discussed cessation/primary prevention of drug use and availability of treatment for abuse.   Diet: Encouraged to adjust caloric intake to maintain  or achieve ideal body weight, to reduce intake of dietary saturated fat and total fat, to limit sodium intake by avoiding high sodium foods and not adding table salt, and to maintain adequate dietary potassium and calcium preferably from fresh fruits, vegetables, and low-fat dairy products.    Stressed the importance of regular exercise  Injury prevention: Discussed safety belts, safety helmets, smoke detector, smoking near bedding or upholstery.   Dental health: Discussed importance of regular tooth brushing, flossing, and dental visits.    NEXT PREVENTATIVE PHYSICAL DUE IN 1 YEAR. Return in about 6 months (around 10/07/2023) for T2DM, HTN/HLD, GERD.

## 2023-04-09 NOTE — Assessment & Plan Note (Signed)
Chronic, ongoing.  Check lipid panel.  Continue Rosuvastatin as ordered, refills sent in. Fasting labs.

## 2023-04-09 NOTE — Assessment & Plan Note (Signed)
Refer to hepatic steatosis plan of care. 

## 2023-04-09 NOTE — Assessment & Plan Note (Signed)
Ongoing stable with diet only control.  A1c 6.2% last visit, well below goal, and urine ALB 150 (March 2024), would benefit ACE or ARB, but is allergic to both.  Recheck A1c today and adjust plan of care as needed.  Recommend she check BS every morning with goal <130.  Focus on diet control at home.  Return in 6 months. - Eye exam needed, she will schedule.  Foot exam up to date. - [facility], Pneumococcal up to date. - Statin on board.

## 2023-04-10 LAB — CBC WITH DIFFERENTIAL/PLATELET
Basophils Absolute: 0 10*3/uL (ref 0.0–0.2)
Basos: 1 %
EOS (ABSOLUTE): 0.1 10*3/uL (ref 0.0–0.4)
Eos: 3 %
Hematocrit: 44.1 % (ref 34.0–46.6)
Hemoglobin: 14.1 g/dL (ref 11.1–15.9)
Immature Grans (Abs): 0 10*3/uL (ref 0.0–0.1)
Immature Granulocytes: 0 %
Lymphocytes Absolute: 1.8 10*3/uL (ref 0.7–3.1)
Lymphs: 46 %
MCH: 29.8 pg (ref 26.6–33.0)
MCHC: 32 g/dL (ref 31.5–35.7)
MCV: 93 fL (ref 79–97)
Monocytes Absolute: 0.3 10*3/uL (ref 0.1–0.9)
Monocytes: 8 %
Neutrophils Absolute: 1.6 10*3/uL (ref 1.4–7.0)
Neutrophils: 42 %
Platelets: 266 10*3/uL (ref 150–450)
RBC: 4.73 x10E6/uL (ref 3.77–5.28)
RDW: 13.1 % (ref 11.7–15.4)
WBC: 3.8 10*3/uL (ref 3.4–10.8)

## 2023-04-10 LAB — COMPREHENSIVE METABOLIC PANEL
ALT: 15 [IU]/L (ref 0–32)
AST: 23 [IU]/L (ref 0–40)
Albumin: 4.3 g/dL (ref 3.9–4.9)
Alkaline Phosphatase: 65 [IU]/L (ref 44–121)
BUN/Creatinine Ratio: 13 (ref 12–28)
BUN: 11 mg/dL (ref 8–27)
Bilirubin Total: 0.4 mg/dL (ref 0.0–1.2)
CO2: 25 mmol/L (ref 20–29)
Calcium: 9.3 mg/dL (ref 8.7–10.3)
Chloride: 103 mmol/L (ref 96–106)
Creatinine, Ser: 0.84 mg/dL (ref 0.57–1.00)
Globulin, Total: 2.6 g/dL (ref 1.5–4.5)
Glucose: 96 mg/dL (ref 70–99)
Potassium: 3.7 mmol/L (ref 3.5–5.2)
Sodium: 141 mmol/L (ref 134–144)
Total Protein: 6.9 g/dL (ref 6.0–8.5)
eGFR: 78 mL/min/{1.73_m2} (ref >59)

## 2023-04-10 LAB — HEMOGLOBIN A1C
Est. average glucose Bld gHb Est-mCnc: 128 mg/dL
Hgb A1c MFr Bld: 6.1 % — ABNORMAL HIGH (ref 4.8–5.6)

## 2023-04-10 LAB — LIPID PANEL W/O CHOL/HDL RATIO
Cholesterol, Total: 163 mg/dL (ref 100–199)
HDL: 50 mg/dL (ref >39)
LDL Chol Calc (NIH): 89 mg/dL (ref 0–99)
Triglycerides: 139 mg/dL (ref 0–149)
VLDL Cholesterol Cal: 24 mg/dL (ref 5–40)

## 2023-04-10 LAB — MAGNESIUM: Magnesium: 2.3 mg/dL (ref 1.6–2.3)

## 2023-04-10 LAB — TSH: TSH: 1.92 u[IU]/mL (ref 0.450–4.500)

## 2023-04-10 NOTE — Telephone Encounter (Signed)
Request was refilled 04/09/23, duplicate request. E-Prescribing Status: Receipt confirmed by pharmacy (04/09/2023  9:29 AM EDT).  Requested Prescriptions  Pending Prescriptions Disp Refills   verapamil (VERELAN) 180 MG 24 hr capsule [Pharmacy Med Name: Verapamil HCl ER 180 MG Oral Capsule Extended Release 24 Hour] 30 capsule 0    Sig: Take 1 capsule by mouth at bedtime     Cardiovascular: Calcium Channel Blockers 3 Passed - 04/09/2023  6:51 AM      Passed - ALT in normal range and within 360 days    ALT  Date Value Ref Range Status  04/09/2023 15 0 - 32 IU/L Final         Passed - AST in normal range and within 360 days    AST  Date Value Ref Range Status  04/09/2023 23 0 - 40 IU/L Final         Passed - Cr in normal range and within 360 days    Creatinine, Ser  Date Value Ref Range Status  04/09/2023 0.84 0.57 - 1.00 mg/dL Final         Passed - Last BP in normal range    BP Readings from Last 1 Encounters:  04/09/23 122/77         Passed - Last Heart Rate in normal range    Pulse Readings from Last 1 Encounters:  04/09/23 76         Passed - Valid encounter within last 6 months    Recent Outpatient Visits           Yesterday Type 2 diabetes mellitus with obesity (HCC)   [place] [facility] [place], Pollard T, NP   3 months ago Bilateral hip pain   [place] [facility] [place], Colma T, NP   3 months ago Bilateral hip pain   [place] [facility] [place], Chumuckla T, NP   6 months ago Type 2 diabetes mellitus with obesity (HCC)   [place] [facility] [place], Corrie Dandy T, NP   9 months ago Right hip pain   [place] [facility] [place], Oswaldo Conroy, PA-C       Future Appointments             In 6 months [place], Dorie Rank, NP [facility]

## 2023-04-10 NOTE — Progress Notes (Signed)
Contacted via MyChart   Good afternoon Kadian, your labs have returned and overall remains stable.  A1c trended down just a smidgen to 6.1%, was 6.2%.  Continue your diet focus. No medication changes needed.  Any questions? Keep being awesome!!  Thank you for allowing me to participate in your care.  I appreciate you. Kindest regards, Yailin Biederman

## 2023-04-16 ENCOUNTER — Ambulatory Visit
Admission: EM | Admit: 2023-04-16 | Discharge: 2023-04-16 | Disposition: A | Discharge status: Home / Self Care | Payer: 59 | Attending: Emergency Medicine | Admitting: Emergency Medicine

## 2023-04-16 DIAGNOSIS — J069 Acute upper respiratory infection, unspecified: Secondary | ICD-10-CM | POA: Diagnosis not present

## 2023-04-16 MED ORDER — PROMETHAZINE-DM 6.25-15 MG/5ML PO SYRP: 5.0000 mL | ORAL_SOLUTION | Freq: Every evening | ORAL | 0 refills | Status: DC | PRN

## 2023-04-16 MED ORDER — BENZONATATE 100 MG PO CAPS: 100.0000 mg | ORAL_CAPSULE | Freq: Three times a day (TID) | ORAL | 0 refills | Status: DC

## 2023-04-16 NOTE — Discharge Instructions (Addendum)
Your symptoms today are most likely being caused by a virus and should steadily improve in time it can take up to 7 to 10 days before you truly start to see a turnaround however things will get better  May use Tessalon pill every 8 hours as needed to help calm your coughing  May use cough syrup as needed at bedtime for additional comfort, be mindful this will make you sleepy  Continue use of Mucinex to reduce congestion in addition to prescribed medicine, may attempt any of the following below as needed    You can take Tylenol and/or Ibuprofen as needed for fever reduction and pain relief.   For cough: honey 1/2 to 1 teaspoon (you can dilute the honey in water or another fluid).  You can also use guaifenesin and dextromethorphan for cough. You can use a humidifier for chest congestion and cough.  If you don't have a humidifier, you can sit in the bathroom with the hot shower running.      For sore throat: try warm salt water gargles, cepacol lozenges, throat spray, warm tea or water with lemon/honey, popsicles or ice, or OTC cold relief medicine for throat discomfort.   For congestion: take a daily anti-histamine like Zyrtec, Claritin, and a oral decongestant, such as pseudoephedrine.  You can also use Flonase 1-2 sprays in each nostril daily.   It is important to stay hydrated: drink plenty of fluids (water, gatorade/powerade/pedialyte, juices, or teas) to keep your throat moisturized and help further relieve irritation/discomfort.

## 2023-04-16 NOTE — ED Provider Notes (Signed)
[facility]    CSN: [id number] Arrival date & time: 04/16/23  1021      History   Chief Complaint No chief complaint on file.   HPI Linda Boyle is a 62 y.o. female.   Patient presents for evaluation of chills, nasal congestion, rhinorrhea, productive cough with clear sputum, sore throat, intermittent generalized headaches present for 4 days.  Headache as resolved.  No known sick contacts prior.  Tolerating food and liquids.  Has attempted use of Mucinex and Tylenol which has been somewhat helpful.  Denies presence of fever, shortness of breath, wheezing or abdominal symptoms.  Blood pressure 130/96, heart rate 67, O2 saturation 100% on room air, temperature 98.3, respirations 20  Past Medical History:  Diagnosis Date   Allergy    Diabetes mellitus without complication (HCC)    controlled with diet   GERD (gastroesophageal reflux disease)    Hepatitis    Hepatitis A   Hyperlipidemia    Hypertension    Mitral valve prolapse    Neck sprain    Vertigo     Patient Active Problem List   Diagnosis Date Noted   Bilateral hip pain 12/19/2022   Internal and external hemorrhoids without complication    Anal polyp    Hepatic steatosis 11/12/2021   History of colonic polyps    Elevated LFTs 09/29/2019   Type 2 diabetes mellitus with proteinuria (HCC) 09/26/2019   Type 2 diabetes mellitus with obesity (HCC) 09/13/2018   BMI 28.0-28.9,adult 09/07/2017   Atrophic vaginitis 01/26/2017   Allergic rhinitis 07/16/2015   GERD (gastroesophageal reflux disease) 07/16/2015   Hyperlipidemia associated with type 2 diabetes mellitus (HCC) 07/16/2015   Hypertension associated with diabetes (HCC) 07/16/2015    Past Surgical History:  Procedure Laterality Date   ABDOMINAL HYSTERECTOMY     partial   COLONOSCOPY WITH PROPOFOL N/A 11/02/2021   Procedure: COLONOSCOPY WITH PROPOFOL;  Surgeon: Toney Reil, MD;  Location: [facility];  Service: Gastroenterology;   Laterality: N/A;   DILATION AND CURETTAGE OF UTERUS  [id number],[id number]   EVALUATION UNDER ANESTHESIA WITH HEMORRHOIDECTOMY N/A 12/08/2021   Procedure: EXAM UNDER ANESTHESIA WITH HEMORRHOIDECTOMY;  Surgeon: Henrene Dodge, MD;  Location: [facility];  Service: General;  Laterality: N/A;  with excision of anal polyp   TOTAL VAGINAL HYSTERECTOMY  1994   Ovaries remain   TUBAL LIGATION      OB History     Gravida  3   Para      Term      Preterm      AB      Living         SAB      IAB      Ectopic      Multiple      Live Births               Home Medications    Prior to Admission medications   Medication Sig Start Date End Date Taking? Authorizing Provider  benzonatate (TESSALON) 100 MG capsule Take 1 capsule (100 mg total) by mouth every 8 (eight) hours. 04/16/23  Yes Tamarick Kovalcik R, NP  promethazine-dextromethorphan (PROMETHAZINE-DM) 6.25-15 MG/5ML syrup Take 5 mLs by mouth at bedtime as needed for cough. 04/16/23  Yes Ann Groeneveld R, NP  Cholecalciferol (VITAMIN D-3) 1000 UNITS CAPS Take 1,000 Units by mouth daily.    [provider]  cyclobenzaprine (FLEXERIL) 10 MG tablet Take 1 tablet (10 mg total) by mouth 3 (three) times  daily as needed for muscle spasms. 12/19/22   Aura Dials T, NP  fluticasone (FLONASE) 50 MCG/ACT nasal spray Use 2 spray(s) in each nostril once daily 06/07/22   Cannady, Jolene T, NP  Lancets (ONETOUCH DELICA PLUS LANCET30G) MISC USE TO CHECK BLOOD SUGAR UP TO 4 TIMES DAILY 10/07/21   Cannady, Jolene T, NP  loratadine (CLARITIN) 10 MG tablet Take 10 mg by mouth daily.    [provider]  meclizine (ANTIVERT) 25 MG tablet Take 1 tablet (25 mg total) by mouth 3 (three) times daily as needed for dizziness. 01/30/19   Aura Dials T, NP  Multiple Vitamins-Minerals (MULTIVITAMIN GUMMIES ADULT PO) Take by mouth daily.    [provider]  omeprazole (PRILOSEC) 20 MG capsule Take 1 capsule (20 mg total) by mouth daily. 04/09/23    Cannady, Corrie Dandy T, NP  ONETOUCH ULTRA test strip USE TO CHECK BLOOD SUGAR UP TO 4 TIMES DAILY AS DIRECTED 10/07/21   Cannady, Corrie Dandy T, NP  rosuvastatin (CRESTOR) 40 MG tablet Take 1 tablet (40 mg total) by mouth daily. 04/09/23   Cannady, Corrie Dandy T, NP  verapamil (VERELAN) 180 MG 24 hr capsule Take 1 capsule (180 mg total) by mouth at bedtime. 04/09/23   Marjie Skiff, NP    Family History Family History  Problem Relation Age of Onset   Diabetes Mother    Alzheimer's disease Father    Hypertension Father    Diabetes Sister    Hypertension Brother    Asthma Brother    Diabetes Brother    Hypertension Sister    Breast cancer Neg Hx     Social History Social History   Tobacco Use   Smoking status: Never    Passive exposure: Never   Smokeless tobacco: Never  Vaping Use   Vaping status: Never Used  Substance Use Topics   Alcohol use: No   Drug use: No     Allergies   Lisinopril and Losartan   Review of Systems Review of Systems   Physical Exam Triage Vital Signs ED Triage Vitals  Encounter Vitals Group     BP      Systolic BP Percentile      Diastolic BP Percentile      Pulse      Resp      Temp      Temp src      SpO2      Weight      Height      Head Circumference      Peak Flow      Pain Score      Pain Loc      Pain Education      Exclude from Growth Chart    No data found.  Updated Vital Signs LMP 10/14/1992 (Approximate)   Visual Acuity Right Eye Distance:   Left Eye Distance:   Bilateral Distance:    Right Eye Near:   Left Eye Near:    Bilateral Near:     Physical Exam Constitutional:      Appearance: Normal appearance.  HENT:     Right Ear: Tympanic membrane, ear canal and external ear normal.     Left Ear: Tympanic membrane, ear canal and external ear normal.     Nose: Congestion and rhinorrhea present.     Mouth/Throat:     Mouth: Mucous membranes are moist.     Pharynx: Oropharynx is clear. Posterior oropharyngeal  erythema present. No oropharyngeal exudate.  Eyes:  Extraocular Movements: Extraocular movements intact.  Cardiovascular:     Rate and Rhythm: Normal rate and regular rhythm.     Pulses: Normal pulses.     Heart sounds: Normal heart sounds.  Pulmonary:     Effort: Pulmonary effort is normal.     Breath sounds: Normal breath sounds.  Musculoskeletal:     Cervical back: Normal range of motion and neck supple.  Neurological:     Mental Status: She is alert and oriented to person, place, and time. Mental status is at baseline.      UC Treatments / Results  Labs (all labs ordered are listed, but only abnormal results are displayed) Labs Reviewed - No data to display  EKG   Radiology No results found.  Procedures Procedures (including critical care time)  Medications Ordered in UC Medications - No data to display  Initial Impression / Assessment and Plan / UC Course  I have reviewed the triage vital signs and the nursing notes.  Pertinent labs & imaging results that were available during my care of the patient were reviewed by me and considered in my medical decision making (see chart for details).  Viral with cough  Patient is in no signs of distress nor toxic appearing.  Vital signs are stable.  Low suspicion for pneumonia, pneumothorax or bronchitis and therefore will defer imaging.  Prescribed Tessalon and Promethazine DM.May use additional over-the-counter medications as needed for supportive care.  May follow-up with urgent care as needed if symptoms persist or worsen.   Final Clinical Impressions(s) / UC Diagnoses   Final diagnoses:  Viral URI with cough     Discharge Instructions      Your symptoms today are most likely being caused by a virus and should steadily improve in time it can take up to 7 to 10 days before you truly start to see a turnaround however things will get better  May use Tessalon pill every 8 hours as needed to help calm your  coughing  May use cough syrup as needed at bedtime for additional comfort, be mindful this will make you sleepy  Continue use of Mucinex to reduce congestion in addition to prescribed medicine, may attempt any of the following below as needed    You can take Tylenol and/or Ibuprofen as needed for fever reduction and pain relief.   For cough: honey 1/2 to 1 teaspoon (you can dilute the honey in water or another fluid).  You can also use guaifenesin and dextromethorphan for cough. You can use a humidifier for chest congestion and cough.  If you don't have a humidifier, you can sit in the bathroom with the hot shower running.      For sore throat: try warm salt water gargles, cepacol lozenges, throat spray, warm tea or water with lemon/honey, popsicles or ice, or OTC cold relief medicine for throat discomfort.   For congestion: take a daily anti-histamine like Zyrtec, Claritin, and a oral decongestant, such as pseudoephedrine.  You can also use Flonase 1-2 sprays in each nostril daily.   It is important to stay hydrated: drink plenty of fluids (water, gatorade/powerade/pedialyte, juices, or teas) to keep your throat moisturized and help further relieve irritation/discomfort.    ED Prescriptions     Medication Sig Dispense Auth. Provider   benzonatate (TESSALON) 100 MG capsule Take 1 capsule (100 mg total) by mouth every 8 (eight) hours. 21 capsule Adelyna Brockman R, NP   promethazine-dextromethorphan (PROMETHAZINE-DM) 6.25-15 MG/5ML syrup Take 5 mLs by  mouth at bedtime as needed for cough. 118 mL Florene Brill, Elita Boone, NP      PDMP not reviewed this encounter.   Valinda Hoar, NP 04/16/23 1109

## 2023-09-22 NOTE — Patient Instructions (Signed)

## 2023-09-24 ENCOUNTER — Telehealth (INDEPENDENT_AMBULATORY_CARE_PROVIDER_SITE_OTHER): Payer: Self-pay | Admitting: Nurse Practitioner

## 2023-09-24 ENCOUNTER — Encounter: Payer: Self-pay | Admitting: Nurse Practitioner

## 2023-09-24 DIAGNOSIS — R052 Subacute cough: Secondary | ICD-10-CM

## 2023-09-24 MED ORDER — ONETOUCH ULTRA VI STRP: ORAL_STRIP | 4 refills | Status: DC

## 2023-09-24 MED ORDER — BENZONATATE 100 MG PO CAPS: 100.0000 mg | ORAL_CAPSULE | Freq: Three times a day (TID) | ORAL | 0 refills | Status: DC

## 2023-09-24 MED ORDER — FLUTICASONE PROPIONATE 50 MCG/ACT NA SUSP: 2.0000 | Freq: Every day | NASAL | 5 refills | Status: AC

## 2023-09-24 MED ORDER — AMOXICILLIN-POT CLAVULANATE 875-125 MG PO TABS: 1.0000 | ORAL_TABLET | Freq: Two times a day (BID) | ORAL | 0 refills | Status: AC

## 2023-09-24 NOTE — Progress Notes (Signed)
 LMP 10/14/1992 (Approximate)    Subjective:    Patient ID: Linda Boyle, female    DOB: 1960-12-08, 63 y.o.   MRN: [id number]  HPI: Linda Boyle is a 63 y.o. female  Chief Complaint  Patient presents with   URI    Patient states she has been having issues with congestion and a cough for the last 2 weeks. States she started out with symptoms like chills, sore throat, and just not feeling well. The congestion and cough has lingered.    Virtual Visit via Video Note  I connected with Linda Boyle on 09/24/23 at  2:20 PM EDT by a video enabled telemedicine application and verified that I am speaking with the correct person using two identifiers.  Location: Patient: home Provider: work   I discussed the limitations of evaluation and management by telemedicine and the availability of in person appointments. The patient expressed understanding and agreed to proceed.  I discussed the assessment and treatment plan with the patient. The patient was provided an opportunity to ask questions and all were answered. The patient agreed with the plan and demonstrated an understanding of the instructions.   The patient was advised to call back or seek an in-person evaluation if the symptoms worsen or if the condition fails to improve as anticipated.  I provided 25 minutes of non-face-to-face time during this encounter.   Marjie Skiff, NP   UPPER RESPIRATORY TRACT INFECTION Has had a cough for 2 weeks.  Started with symptoms: chills, body aches, sore throat, lack of appetite.  Did not test for Covid or Flu at the time. Fever: chills only Cough: yes Shortness of breath: no Wheezing: no Chest pain: no Chest tightness: no Chest congestion: no Nasal congestion: no Runny nose: yes Post nasal drip: yes Sneezing: no Sore throat:  initially Swollen glands: no Sinus pressure: no Headache:  initially Face pain: no Toothache: no Ear pain: none Ear pressure: none Eyes  red/itching:no Eye drainage/crusting: no  Vomiting: no Rash: no Fatigue: yes Sick contacts: no Strep contacts: no  Context: fluctuating Recurrent sinusitis: no Relief with OTC cold/cough medications:  somewhat   Treatments attempted: cold/sinus and cough syrup    Relevant past medical, surgical, family and social history reviewed and updated as indicated. Interim medical history since our last visit reviewed. Allergies and medications reviewed and updated.  Review of Systems  Constitutional:  Positive for chills and fatigue. Negative for activity change, appetite change and fever.  HENT:  Positive for postnasal drip, rhinorrhea and sore throat. Negative for congestion, ear discharge, ear pain, facial swelling, sinus pressure, sinus pain, sneezing and voice change.   Respiratory:  Positive for cough. Negative for chest tightness, shortness of breath and wheezing.   Cardiovascular:  Negative for chest pain, palpitations and leg swelling.  Gastrointestinal: Negative.   Endocrine: Negative.   Neurological:  Positive for headaches. Negative for dizziness and numbness.  Psychiatric/Behavioral: Negative.      Per HPI unless specifically indicated above     Objective:    LMP 10/14/1992 (Approximate)   Wt Readings from Last 3 Encounters:  04/09/23 152 lb 9.6 oz (69.2 kg)  01/10/23 146 lb 6.4 oz (66.4 kg)  12/19/22 147 lb 9.6 oz (67 kg)    Physical Exam Vitals and nursing note reviewed.  Constitutional:      General: She is awake. She is not in acute distress.    Appearance: She is well-developed and well-groomed. She is not ill-appearing or toxic-appearing.  HENT:     Head: Normocephalic.     Right Ear: Hearing normal.     Left Ear: Hearing normal.  Eyes:     General: Lids are normal.        Right eye: No discharge.        Left eye: No discharge.     Conjunctiva/sclera: Conjunctivae normal.  Pulmonary:     Effort: Pulmonary effort is normal. No accessory muscle usage or  respiratory distress.  Musculoskeletal:     Cervical back: Normal range of motion.  Neurological:     Mental Status: She is alert and oriented to person, place, and time.  Psychiatric:        Attention and Perception: Attention normal.        Mood and Affect: Mood normal.        Behavior: Behavior normal. Behavior is cooperative.        Thought Content: Thought content normal.        Judgment: Judgment normal.     Results for orders placed or performed in visit on 04/09/23  CBC with Differential/Platelet   Collection Time: 04/09/23  9:35 AM  Result Value Ref Range   WBC 3.8 3.4 - 10.8 x10E3/uL   RBC 4.73 3.77 - 5.28 x10E6/uL   Hemoglobin 14.1 11.1 - 15.9 g/dL   Hematocrit 40.9 81.1 - 46.6 %   MCV 93 79 - 97 fL   MCH 29.8 26.6 - 33.0 pg   MCHC 32.0 31.5 - 35.7 g/dL   RDW 91.4 78.2 - 95.6 %   Platelets 266 150 - 450 x10E3/uL   Neutrophils 42 Not Estab. %   Lymphs 46 Not Estab. %   Monocytes 8 Not Estab. %   Eos 3 Not Estab. %   Basos 1 Not Estab. %   Neutrophils Absolute 1.6 1.4 - 7.0 x10E3/uL   Lymphocytes Absolute 1.8 0.7 - 3.1 x10E3/uL   Monocytes Absolute 0.3 0.1 - 0.9 x10E3/uL   EOS (ABSOLUTE) 0.1 0.0 - 0.4 x10E3/uL   Basophils Absolute 0.0 0.0 - 0.2 x10E3/uL   Immature Granulocytes 0 Not Estab. %   Immature Grans (Abs) 0.0 0.0 - 0.1 x10E3/uL  Comprehensive metabolic panel   Collection Time: 04/09/23  9:35 AM  Result Value Ref Range   Glucose 96 70 - 99 mg/dL   BUN 11 8 - 27 mg/dL   Creatinine, Ser 2.13 0.57 - 1.00 mg/dL   eGFR 78 >08 MV/HQI/6.96   BUN/Creatinine Ratio 13 12 - 28   Sodium 141 134 - 144 mmol/L   Potassium 3.7 3.5 - 5.2 mmol/L   Chloride 103 96 - 106 mmol/L   CO2 25 20 - 29 mmol/L   Calcium 9.3 8.7 - 10.3 mg/dL   Total Protein 6.9 6.0 - 8.5 g/dL   Albumin 4.3 3.9 - 4.9 g/dL   Globulin, Total 2.6 1.5 - 4.5 g/dL   Bilirubin Total 0.4 0.0 - 1.2 mg/dL   Alkaline Phosphatase 65 44 - 121 IU/L   AST 23 0 - 40 IU/L   ALT 15 0 - 32 IU/L  TSH    Collection Time: 04/09/23  9:35 AM  Result Value Ref Range   TSH 1.920 0.450 - 4.500 uIU/mL  Magnesium   Collection Time: 04/09/23  9:35 AM  Result Value Ref Range   Magnesium 2.3 1.6 - 2.3 mg/dL  Lipid Panel w/o Chol/HDL Ratio   Collection Time: 04/09/23  9:35 AM  Result Value Ref Range   Cholesterol, Total 163  100 - 199 mg/dL   Triglycerides 161 0 - 149 mg/dL   HDL 50 >09 mg/dL   VLDL Cholesterol Cal 24 5 - 40 mg/dL   LDL Chol Calc (NIH) 89 0 - 99 mg/dL  HgB U0A   Collection Time: 04/09/23  9:35 AM  Result Value Ref Range   Hgb A1c MFr Bld 6.1 (H) 4.8 - 5.6 %   Est. average glucose Bld gHb Est-mCnc 128 mg/dL      Assessment & Plan:   Problem List Items Addressed This Visit       Other   Subacute cough - Primary   Present for 2 weeks since initial acute illness.  Is diabetic.  Will start Augmentin BID for 7 days and send in refills on Flonase and Tessalon for her.  Recommend: - Increased rest - Increasing Fluids - Acetaminophen as needed for fever/pain.  - Salt water gargling, chloraseptic spray and throat lozenges - Mucinex.  - Humidifying the air.         Follow up plan: Return if symptoms worsen or fail to improve.

## 2023-09-24 NOTE — Assessment & Plan Note (Signed)
 Present for 2 weeks since initial acute illness.  Is diabetic.  Will start Augmentin BID for 7 days and send in refills on Flonase and Tessalon for her.  Recommend: - Increased rest - Increasing Fluids - Acetaminophen as needed for fever/pain.  - Salt water gargling, chloraseptic spray and throat lozenges - Mucinex.  - Humidifying the air.

## 2023-09-30 NOTE — Patient Instructions (Signed)
 Be Involved in Caring For Your Health:  Taking Medications When medications are taken as directed, they can greatly improve your health. But if they are not taken as prescribed, they may not work. In some cases, not taking them correctly can be harmful. To help ensure your treatment remains effective and safe, understand your medications and how to take them. Bring your medications to each visit for review by your provider.  Your lab results, notes, and after visit summary will be available on My Chart. We strongly encourage you to use this feature. If lab results are abnormal the clinic will contact you with the appropriate steps. If the clinic does not contact you assume the results are satisfactory. You can always view your results on My Chart. If you have questions regarding your health or results, please contact the clinic during office hours. You can also ask questions on My Chart.  We at [facility] are grateful that you chose [place] to provide your care. We strive to provide evidence-based and compassionate care and are always looking for feedback. If you get a survey from the clinic please complete this so we can hear your opinions.  Diabetes Mellitus and Foot Care Diabetes, also called diabetes mellitus, may cause problems with your feet and legs because of poor blood flow (circulation). Poor circulation may make your skin: Become thinner and drier. Break more easily. Heal more slowly. Peel and crack. You may also have nerve damage (neuropathy). This can cause decreased feeling in your legs and feet. This means that you may not notice minor injuries to your feet that could lead to more serious problems. Finding and treating problems early is the best way to prevent future foot problems. How to care for your feet Foot hygiene  Wash your feet daily with warm water and mild soap. Do not use hot water. Then, pat your feet and the areas between your toes until they are fully dry. Do  not soak your feet. This can dry your skin. Trim your toenails straight across. Do not dig under them or around the cuticle. File the edges of your nails with an emery board or nail file. Apply a moisturizing lotion or petroleum jelly to the skin on your feet and to dry, brittle toenails. Use lotion that does not contain alcohol and is unscented. Do not apply lotion between your toes. Shoes and socks Wear clean socks or stockings every day. Make sure they are not too tight. Do not wear knee-high stockings. These may decrease blood flow to your legs. Wear shoes that fit well and have enough cushioning. Always look in your shoes before you put them on to be sure there are no objects inside. To break in new shoes, wear them for just a few hours a day. This prevents injuries on your feet. Wounds, scrapes, corns, and calluses  Check your feet daily for blisters, cuts, bruises, sores, and redness. If you cannot see the bottom of your feet, use a mirror or ask someone for help. Do not cut off corns or calluses or try to remove them with medicine. If you find a minor scrape, cut, or break in the skin on your feet, keep it and the skin around it clean and dry. You may clean these areas with mild soap and water. Do not clean the area with peroxide, alcohol, or iodine. If you have a wound, scrape, corn, or callus on your foot, look at it several times a day to make sure it  is healing and not infected. Check for: Redness, swelling, or pain. Fluid or blood. Warmth. Pus or a bad smell. General tips Do not cross your legs. This may decrease blood flow to your feet. Do not use heating pads or hot water bottles on your feet. They may burn your skin. If you have lost feeling in your feet or legs, you may not know this is happening until it is too late. Protect your feet from hot and cold by wearing shoes, such as at the beach or on hot pavement. Schedule a complete foot exam at least once a year or more often if  you have foot problems. Report any cuts, sores, or bruises to your health care provider right away. Where to find more information American Diabetes Association: diabetes.org Association of Diabetes Care & Education Specialists: diabeteseducator.org Contact a health care provider if: You have a condition that increases your risk of infection, and you have any cuts, sores, or bruises on your feet. You have an injury that is not healing. You have redness on your legs or feet. You feel burning or tingling in your legs or feet. You have pain or cramps in your legs and feet. Your legs or feet are numb. Your feet always feel cold. You have pain around any toenails. Get help right away if: You have a wound, scrape, corn, or callus on your foot and: You have signs of infection. You have a fever. You have a red line going up your leg. This information is not intended to replace advice given to you by your health care provider. Make sure you discuss any questions you have with your health care provider. Document Revised: 12/28/2021 Document Reviewed: 12/28/2021 Elsevier Patient Education  2024 [employer].

## 2023-10-03 ENCOUNTER — Ambulatory Visit (INDEPENDENT_AMBULATORY_CARE_PROVIDER_SITE_OTHER): Admitting: Nurse Practitioner

## 2023-10-03 ENCOUNTER — Encounter: Payer: Self-pay | Admitting: Nurse Practitioner

## 2023-10-03 VITALS — BP 121/74 | HR 73 | Temp 97.7°F | Ht 61.2 in | Wt 154.0 lb

## 2023-10-03 DIAGNOSIS — E1129 Type 2 diabetes mellitus with other diabetic kidney complication: Secondary | ICD-10-CM | POA: Diagnosis not present

## 2023-10-03 DIAGNOSIS — E1169 Type 2 diabetes mellitus with other specified complication: Secondary | ICD-10-CM | POA: Diagnosis not present

## 2023-10-03 DIAGNOSIS — K760 Fatty (change of) liver, not elsewhere classified: Secondary | ICD-10-CM

## 2023-10-03 DIAGNOSIS — I152 Hypertension secondary to endocrine disorders: Secondary | ICD-10-CM

## 2023-10-03 DIAGNOSIS — Z6828 Body mass index (BMI) 28.0-28.9, adult: Secondary | ICD-10-CM

## 2023-10-03 DIAGNOSIS — E1159 Type 2 diabetes mellitus with other circulatory complications: Secondary | ICD-10-CM | POA: Diagnosis not present

## 2023-10-03 DIAGNOSIS — R809 Proteinuria, unspecified: Secondary | ICD-10-CM

## 2023-10-03 DIAGNOSIS — E669 Obesity, unspecified: Secondary | ICD-10-CM

## 2023-10-03 DIAGNOSIS — E785 Hyperlipidemia, unspecified: Secondary | ICD-10-CM

## 2023-10-03 LAB — MICROALBUMIN, URINE WAIVED
Creatinine, Urine Waived: 2000 mg/dL — ABNORMAL HIGH (ref 10–300)
Microalb, Ur Waived: 80 mg/L — ABNORMAL HIGH (ref 0–19)

## 2023-10-03 LAB — BAYER DCA HB A1C WAIVED: HB A1C (BAYER DCA - WAIVED): 6.2 % — ABNORMAL HIGH (ref 4.8–5.6)

## 2023-10-03 NOTE — Assessment & Plan Note (Signed)
 BMI 28.91.  Recommended to eat smaller high protein, low fat meals more frequently and exercising 30 mins a day 5 times a week. Patient voiced their understanding and motivation to adhere to these recommendations. Return in 6 months.

## 2023-10-03 NOTE — Assessment & Plan Note (Addendum)
 Ongoing, stable with diet only control.  A1c 6.1% last visit, well below goal, would have benefit with an ACE or ARB, but is allergic to both. A1c today is 6.2%, continue current plan of care.  Recommend she check BS every morning with goal <130.  Focus on diet control at home.  Return in 6 months. - Eye exam needed, she will reschedule.   - Pneumococcal up to date. Recommend Flu vaccine in the Fall. - Statin on board.

## 2023-10-03 NOTE — Assessment & Plan Note (Signed)
 Chronic, stable. Has not been checking blood pressure at home, recommend she check it at least 3 times a week at home and document. Dash diet at home. Continue current medication regimen of Verapamil and will adjust when needed. Labs today: CMP, Lipid panel, Microalbumin. Return in 6 months.

## 2023-10-03 NOTE — Assessment & Plan Note (Addendum)
 Diabetes currently well controlled. Will recheck labs today. Continue to focus on diet changes. Return in 6 months.

## 2023-10-03 NOTE — Progress Notes (Deleted)
 BP 121/74   Pulse 73   Temp 97.7 F (36.5 C) (Oral)   Ht 5' 1.2" (1.554 m)   Wt 154 lb (69.9 kg)   LMP 10/14/1992 (Approximate)   SpO2 98%   BMI 28.91 kg/m    Subjective:    Patient ID: Linda Boyle, female    DOB: Sep 07, 1960, 63 y.o.   MRN: [id number]  HPI: [name] is a 63 y.o. female  Chief Complaint  Patient presents with   Diabetes    No recent eye exam per patient   Gastroesophageal Reflux   Hyperlipidemia   Hypertension   DIABETES Last A1c was 6.1%.  Continues to be diet controlled. Hypoglycemic episodes:{Blank single:19197::"yes","no"} Polydipsia/polyuria: {Blank single:19197::"yes","no"} Visual disturbance: {Blank single:19197::"yes","no"} Chest pain: {Blank single:19197::"yes","no"} Paresthesias: {Blank single:19197::"yes","no"} Glucose Monitoring: {Blank single:19197::"yes","no"}  Accucheck frequency: {Blank single:19197::"Not Checking","Daily","BID","TID"}  Fasting glucose:  Post prandial:  Evening:  Before meals: Taking Insulin?: {Blank single:19197::"yes","no"}  Long acting insulin:  Short acting insulin: Blood Pressure Monitoring: {Blank single:19197::"not checking","rarely","daily","weekly","monthly","a few times a day","a few times a week","a few times a month"} Retinal Examination: {Blank single:19197::"Up to Date","Not up to Date"} Foot Exam: {Blank single:19197::"Up to Date","Not up to Date"} Diabetic Education: {Blank single:19197::"Completed","Not Completed"} Pneumovax: {Blank single:19197::"Up to Date","Not up to Date","unknown"} Influenza: {Blank single:19197::"Up to Date","Not up to Date","unknown"} Aspirin: {Blank single:19197::"yes","no"}   HYPERTENSION / HYPERLIPIDEMIA Continues to take Verapamil and Crestor. Satisfied with current treatment? {Blank single:19197::"yes","no"} Duration of hypertension: {Blank single:19197::"chronic","months","years"} BP monitoring frequency: {Blank single:19197::"not  checking","rarely","daily","weekly","monthly","a few times a day","a few times a week","a few times a month"} BP range:  BP medication side effects: {Blank single:19197::"yes","no"} Duration of hyperlipidemia: {Blank single:19197::"chronic","months","years"} Cholesterol medication side effects: {Blank single:19197::"yes","no"} Cholesterol supplements: {Blank multiple:19196::"none","fish oil","niacin","red yeast rice"} Medication compliance: {Blank single:19197::"excellent compliance","good compliance","fair compliance","poor compliance"} Aspirin: {Blank single:19197::"yes","no"} Recent stressors: {Blank single:19197::"yes","no"} Recurrent headaches: {Blank single:19197::"yes","no"} Visual changes: {Blank single:19197::"yes","no"} Palpitations: {Blank single:19197::"yes","no"} Dyspnea: {Blank single:19197::"yes","no"} Chest pain: {Blank single:19197::"yes","no"} Lower extremity edema: {Blank single:19197::"yes","no"} Dizzy/lightheaded: {Blank single:19197::"yes","no"}   GERD Continue to take Omeprazole. GERD control status: {Blank single:19197::"controlled","uncontrolled","better","worse","exacerbated","stable"} Satisfied with current treatment? {Blank single:19197::"yes","no"} Heartburn frequency:  Medication side effects: {Blank single:19197::"yes","no"}  Medication compliance: {Blank multiple:19196::"better","worse","stable","fluctuating"} Previous GERD medications: Antacid use frequency:   Duration:  Nature:  Location:  Heartburn duration:  Alleviatiating factors:   Aggravating factors:  Dysphagia: {Blank single:19197::"yes","no"} Odynophagia:  {Blank single:19197::"yes","no"} Hematemesis: {Blank single:19197::"yes","no"} Blood in stool: {Blank single:19197::"yes","no"} EGD: {Blank single:19197::"yes","no"}   Relevant past medical, surgical, family and social history reviewed and updated as indicated. Interim medical history since our last visit reviewed. Allergies and  medications reviewed and updated.  Review of Systems  Per HPI unless specifically indicated above     Objective:    BP 121/74   Pulse 73   Temp 97.7 F (36.5 C) (Oral)   Ht 5' 1.2" (1.554 m)   Wt 154 lb (69.9 kg)   LMP 10/14/1992 (Approximate)   SpO2 98%   BMI 28.91 kg/m   Wt Readings from Last 3 Encounters:  10/03/23 154 lb (69.9 kg)  04/09/23 152 lb 9.6 oz (69.2 kg)  01/10/23 146 lb 6.4 oz (66.4 kg)    Physical Exam  Results for orders placed or performed in visit on 04/09/23  CBC with Differential/Platelet   Collection Time: 04/09/23  9:35 AM  Result Value Ref Range   WBC 3.8 3.4 - 10.8 x10E3/uL   RBC 4.73 3.77 - 5.28 x10E6/uL   Hemoglobin 14.1 11.1 - 15.9 g/dL   Hematocrit 40.9 81.1 - 46.6 %   MCV  93 79 - 97 fL   MCH 29.8 26.6 - 33.0 pg   MCHC 32.0 31.5 - 35.7 g/dL   RDW 62.9 52.8 - 41.3 %   Platelets 266 150 - 450 x10E3/uL   Neutrophils 42 Not Estab. %   Lymphs 46 Not Estab. %   Monocytes 8 Not Estab. %   Eos 3 Not Estab. %   Basos 1 Not Estab. %   Neutrophils Absolute 1.6 1.4 - 7.0 x10E3/uL   Lymphocytes Absolute 1.8 0.7 - 3.1 x10E3/uL   Monocytes Absolute 0.3 0.1 - 0.9 x10E3/uL   EOS (ABSOLUTE) 0.1 0.0 - 0.4 x10E3/uL   Basophils Absolute 0.0 0.0 - 0.2 x10E3/uL   Immature Granulocytes 0 Not Estab. %   Immature Grans (Abs) 0.0 0.0 - 0.1 x10E3/uL  Comprehensive metabolic panel   Collection Time: 04/09/23  9:35 AM  Result Value Ref Range   Glucose 96 70 - 99 mg/dL   BUN 11 8 - 27 mg/dL   Creatinine, Ser 2.44 0.57 - 1.00 mg/dL   eGFR 78 >01 UU/VOZ/3.66   BUN/Creatinine Ratio 13 12 - 28   Sodium 141 134 - 144 mmol/L   Potassium 3.7 3.5 - 5.2 mmol/L   Chloride 103 96 - 106 mmol/L   CO2 25 20 - 29 mmol/L   Calcium 9.3 8.7 - 10.3 mg/dL   Total Protein 6.9 6.0 - 8.5 g/dL   Albumin 4.3 3.9 - 4.9 g/dL   Globulin, Total 2.6 1.5 - 4.5 g/dL   Bilirubin Total 0.4 0.0 - 1.2 mg/dL   Alkaline Phosphatase 65 44 - 121 IU/L   AST 23 0 - 40 IU/L   ALT 15 0 - 32  IU/L  TSH   Collection Time: 04/09/23  9:35 AM  Result Value Ref Range   TSH 1.920 0.450 - 4.500 uIU/mL  Magnesium   Collection Time: 04/09/23  9:35 AM  Result Value Ref Range   Magnesium 2.3 1.6 - 2.3 mg/dL  Lipid Panel w/o Chol/HDL Ratio   Collection Time: 04/09/23  9:35 AM  Result Value Ref Range   Cholesterol, Total 163 100 - 199 mg/dL   Triglycerides 440 0 - 149 mg/dL   HDL 50 >34 mg/dL   VLDL Cholesterol Cal 24 5 - 40 mg/dL   LDL Chol Calc (NIH) 89 0 - 99 mg/dL  HgB V4Q   Collection Time: 04/09/23  9:35 AM  Result Value Ref Range   Hgb A1c MFr Bld 6.1 (H) 4.8 - 5.6 %   Est. average glucose Bld gHb Est-mCnc 128 mg/dL      Assessment & Plan:   Problem List Items Addressed This Visit       Cardiovascular and Mediastinum   Hypertension associated with diabetes (HCC)   Relevant Orders   Bayer DCA Hb A1c Waived   Microalbumin, Urine Waived   Comprehensive metabolic panel     Digestive   Hepatic steatosis   Relevant Orders   Comprehensive metabolic panel     Endocrine   Hyperlipidemia associated with type 2 diabetes mellitus (HCC)   Relevant Orders   Bayer DCA Hb A1c Waived   Comprehensive metabolic panel   Lipid Panel w/o Chol/HDL Ratio   Type 2 diabetes mellitus with obesity (HCC) - Primary   Relevant Orders   Bayer DCA Hb A1c Waived   Microalbumin, Urine Waived   Type 2 diabetes mellitus with proteinuria (HCC)   Relevant Orders   Bayer DCA Hb A1c Waived     Other  BMI 28.0-28.9,adult     Follow up plan: No follow-ups on file.

## 2023-10-03 NOTE — Assessment & Plan Note (Signed)
 Chronic, ongoing. Labs: Lipid panel today. Continue Rosuvastatin as prescribed. Return in 6 months.

## 2023-10-03 NOTE — Progress Notes (Signed)
 BP 121/74   Pulse 73   Temp 97.7 F (36.5 C) (Oral)   Ht 5' 1.2" (1.554 m)   Wt 154 lb (69.9 kg)   LMP 10/14/1992 (Approximate)   SpO2 98%   BMI 28.91 kg/m    Subjective:    Patient ID: Linda Boyle, female    DOB: [birth year], 63 y.o.   MRN: [id number]  HPI: Linda Boyle is a 63 y.o. female reports cough still present but likely due to seasonal allergies.  Chief Complaint  Patient presents with   Diabetes    No recent eye exam per patient   Gastroesophageal Reflux   Hyperlipidemia   Hypertension   NOTE WRITTEN BY DNP STUDENT.  ASSESSMENT AND PLAN OF CARE REVIEWED WITH STUDENT, AGREE WITH ABOVE FINDINGS AND PLAN.   DIABETES Last A1c was 6.1%.  Continues to be diet controlled. Hypoglycemic episodes:no Polydipsia/polyuria: no Visual disturbance: no Chest pain: no Paresthesias: no Glucose Monitoring: no  Accucheck frequency: Not Checking  Taking Insulin?: no  Blood Pressure Monitoring: not checking Retinal Examination:  Missed appointment, will reschedule Foot Exam: Not up to Date Diabetic Education: Completed Pneumovax: Up to Date Influenza: Not up to Date Aspirin: no   HYPERTENSION / HYPERLIPIDEMIA Continues to take Verapamil and Crestor. Satisfied with current treatment? yes Duration of hypertension: chronic BP monitoring frequency: not checking BP medication side effects: no Duration of hyperlipidemia: chronic Cholesterol medication side effects: no Cholesterol supplements: none Medication compliance: good compliance Aspirin: no Recent stressors: no Recurrent headaches: no Visual changes: no Palpitations: no Dyspnea: no Chest pain: no Lower extremity edema: no Dizzy/lightheaded: no   GERD Continue to take Omeprazole. GERD control status: stable Satisfied with current treatment? yes Medication side effects: no  Medication compliance: stable Previous GERD medications: Antacid use frequency:   Duration:  Nature:  Location:   Heartburn duration:  Alleviatiating factors:   Aggravating factors:  Dysphagia: no Odynophagia:  no Hematemesis: no Blood in stool: no EGD: no   Relevant past medical, surgical, family and social history reviewed and updated as indicated. Interim medical history since our last visit reviewed. Allergies and medications reviewed and updated.  Review of Systems  Constitutional:  Negative for appetite change and fatigue.  HENT:  Negative for congestion, sinus pressure and sinus pain.   Eyes:  Negative for itching.  Respiratory:  Positive for cough. Negative for shortness of breath and wheezing.   Cardiovascular:  Negative for chest pain and leg swelling.  Gastrointestinal:  Negative for blood in stool, constipation, diarrhea, nausea and vomiting.  Endocrine: Negative for polydipsia, polyphagia and polyuria.  Genitourinary:  Negative for difficulty urinating, frequency and urgency.  Musculoskeletal:  Negative for back pain and joint swelling.  Skin:  Negative for rash and wound.  Allergic/Immunologic: Positive for environmental allergies.  Neurological:  Negative for dizziness and headaches.  Hematological:  Does not bruise/bleed easily.  Psychiatric/Behavioral:  Negative for agitation. The patient is not nervous/anxious.     Per HPI unless specifically indicated above     Objective:    BP 121/74   Pulse 73   Temp 97.7 F (36.5 C) (Oral)   Ht 5' 1.2" (1.554 m)   Wt 154 lb (69.9 kg)   LMP 10/14/1992 (Approximate)   SpO2 98%   BMI 28.91 kg/m   Wt Readings from Last 3 Encounters:  10/03/23 154 lb (69.9 kg)  04/09/23 152 lb 9.6 oz (69.2 kg)  01/10/23 146 lb 6.4 oz (66.4 kg)    Physical Exam  Vitals and nursing note reviewed.  Constitutional:      General: She is not in acute distress.    Appearance: Normal appearance. She is well-groomed and normal weight. She is not ill-appearing.  Neck:     Thyroid: No thyroid mass or thyromegaly.     Vascular: No carotid bruit.      Trachea: Trachea normal.  Cardiovascular:     Rate and Rhythm: Normal rate and regular rhythm.     Heart sounds: Normal heart sounds. No murmur heard. Pulmonary:     Effort: Pulmonary effort is normal. No respiratory distress.     Breath sounds: Normal breath sounds. No wheezing.  Abdominal:     General: Bowel sounds are normal. There is no distension.     Tenderness: There is no abdominal tenderness.  Musculoskeletal:        General: Normal range of motion.     Cervical back: Normal range of motion and neck supple. No tenderness.     Right lower leg: No edema.     Left lower leg: No edema.  Lymphadenopathy:     Cervical:     Right cervical: No superficial cervical adenopathy.    Left cervical: No superficial cervical adenopathy.  Skin:    General: Skin is warm and dry.  Neurological:     General: No focal deficit present.     Mental Status: She is alert and oriented to person, place, and time. Mental status is at baseline.     Deep Tendon Reflexes: Reflexes are normal and symmetric.  Psychiatric:        Attention and Perception: Attention and perception normal.        Mood and Affect: Mood and affect normal.        Speech: Speech normal.        Behavior: Behavior normal. Behavior is cooperative.        Thought Content: Thought content normal.        Cognition and Memory: Cognition and memory normal.        Judgment: Judgment normal.     Results for orders placed or performed in visit on 04/09/23  CBC with Differential/Platelet   Collection Time: 04/09/23  9:35 AM  Result Value Ref Range   WBC 3.8 3.4 - 10.8 x10E3/uL   RBC 4.73 3.77 - 5.28 x10E6/uL   Hemoglobin 14.1 11.1 - 15.9 g/dL   Hematocrit 19.1 47.8 - 46.6 %   MCV 93 79 - 97 fL   MCH 29.8 26.6 - 33.0 pg   MCHC 32.0 31.5 - 35.7 g/dL   RDW 29.5 62.1 - 30.8 %   Platelets 266 150 - 450 x10E3/uL   Neutrophils 42 Not Estab. %   Lymphs 46 Not Estab. %   Monocytes 8 Not Estab. %   Eos 3 Not Estab. %   Basos 1 Not  Estab. %   Neutrophils Absolute 1.6 1.4 - 7.0 x10E3/uL   Lymphocytes Absolute 1.8 0.7 - 3.1 x10E3/uL   Monocytes Absolute 0.3 0.1 - 0.9 x10E3/uL   EOS (ABSOLUTE) 0.1 0.0 - 0.4 x10E3/uL   Basophils Absolute 0.0 0.0 - 0.2 x10E3/uL   Immature Granulocytes 0 Not Estab. %   Immature Grans (Abs) 0.0 0.0 - 0.1 x10E3/uL  Comprehensive metabolic panel   Collection Time: 04/09/23  9:35 AM  Result Value Ref Range   Glucose 96 70 - 99 mg/dL   BUN 11 8 - 27 mg/dL   Creatinine, Ser 6.57 0.57 - 1.00 mg/dL  eGFR 78 >59 mL/min/1.73   BUN/Creatinine Ratio 13 12 - 28   Sodium 141 134 - 144 mmol/L   Potassium 3.7 3.5 - 5.2 mmol/L   Chloride 103 96 - 106 mmol/L   CO2 25 20 - 29 mmol/L   Calcium 9.3 8.7 - 10.3 mg/dL   Total Protein 6.9 6.0 - 8.5 g/dL   Albumin 4.3 3.9 - 4.9 g/dL   Globulin, Total 2.6 1.5 - 4.5 g/dL   Bilirubin Total 0.4 0.0 - 1.2 mg/dL   Alkaline Phosphatase 65 44 - 121 IU/L   AST 23 0 - 40 IU/L   ALT 15 0 - 32 IU/L  TSH   Collection Time: 04/09/23  9:35 AM  Result Value Ref Range   TSH 1.920 0.450 - 4.500 uIU/mL  Magnesium   Collection Time: 04/09/23  9:35 AM  Result Value Ref Range   Magnesium 2.3 1.6 - 2.3 mg/dL  Lipid Panel w/o Chol/HDL Ratio   Collection Time: 04/09/23  9:35 AM  Result Value Ref Range   Cholesterol, Total 163 100 - 199 mg/dL   Triglycerides 132 0 - 149 mg/dL   HDL 50 >44 mg/dL   VLDL Cholesterol Cal 24 5 - 40 mg/dL   LDL Chol Calc (NIH) 89 0 - 99 mg/dL  HgB W1U   Collection Time: 04/09/23  9:35 AM  Result Value Ref Range   Hgb A1c MFr Bld 6.1 (H) 4.8 - 5.6 %   Est. average glucose Bld gHb Est-mCnc 128 mg/dL      Assessment & Plan:   Problem List Items Addressed This Visit       Cardiovascular and Mediastinum   Hypertension associated with diabetes (HCC)   Chronic, stable. Has not been checking blood pressure at home, recommend she check it at least 3 times a week at home and document. Dash diet at home. Continue current medication regimen of  Verapamil and will adjust when needed. Labs today: CMP, Lipid panel, Microalbumin. Return in 6 months.      Relevant Orders   Bayer DCA Hb A1c Waived   Microalbumin, Urine Waived   Comprehensive metabolic panel     Digestive   Hepatic steatosis   Diabetes currently well controlled. Will recheck labs today. Continue to focus on diet changes. Return in 6 months.      Relevant Orders   Comprehensive metabolic panel     Endocrine   Hyperlipidemia associated with type 2 diabetes mellitus (HCC)   Chronic, ongoing. Labs: Lipid panel today. Continue Rosuvastatin as prescribed. Return in 6 months.      Relevant Orders   Bayer DCA Hb A1c Waived   Comprehensive metabolic panel   Lipid Panel w/o Chol/HDL Ratio   Type 2 diabetes mellitus with obesity (HCC) - Primary   Ongoing, stable with diet only control.  A1c 6.1% last visit, well below goal, would have benefit with an ACE or ARB, but is allergic to both. A1c today is 6.2%, continue current plan of care.  Recommend she check BS every morning with goal <130.  Focus on diet control at home.  Return in 6 months. - Eye exam needed, she will reschedule.   - Pneumococcal up to date. Recommend Flu vaccine in the Fall. - Statin on board.      Relevant Orders   Bayer DCA Hb A1c Waived   Microalbumin, Urine Waived   Type 2 diabetes mellitus with proteinuria (HCC)   Ongoing, stable with diet only control.  A1c 6.1% last  visit, well below goal, would have benefit with an ACE or ARB, but is allergic to both. A1c today is 6.2%, continue current plan of care.  Recommend she check BS every morning with goal <130.  Focus on diet control at home.  Return in 6 months. - Eye exam needed, she will reschedule.   - Pneumococcal up to date. Recommend Flu vaccine in the Fall. - Statin on board.      Relevant Orders   Bayer DCA Hb A1c Waived     Other   BMI 28.0-28.9,adult   BMI 28.91.  Recommended to eat smaller high protein, low fat meals more  frequently and exercising 30 mins a day 5 times a week. Patient voiced their understanding and motivation to adhere to these recommendations. Return in 6 months.        Follow up plan: Return in about 6 months (around 04/04/2024) for Annual Physical -- after 04/08/24.

## 2023-10-03 NOTE — Assessment & Plan Note (Signed)
 Ongoing, stable with diet only control.  A1c 6.1% last visit, well below goal, would have benefit with an ACE or ARB, but is allergic to both. A1c today is 6.2%, continue current plan of care.  Recommend she check BS every morning with goal <130.  Focus on diet control at home.  Return in 6 months. - Eye exam needed, she will reschedule.   - Pneumococcal up to date. Recommend Flu vaccine in the Fall. - Statin on board.

## 2023-10-04 ENCOUNTER — Other Ambulatory Visit: Payer: Self-pay | Admitting: Nurse Practitioner

## 2023-10-04 ENCOUNTER — Encounter: Payer: Self-pay | Admitting: Nurse Practitioner

## 2023-10-04 DIAGNOSIS — E876 Hypokalemia: Secondary | ICD-10-CM

## 2023-10-04 LAB — COMPREHENSIVE METABOLIC PANEL WITH GFR
ALT: 17 IU/L (ref 0–32)
AST: 23 IU/L (ref 0–40)
Albumin: 4.1 g/dL (ref 3.9–4.9)
Alkaline Phosphatase: 66 IU/L (ref 44–121)
BUN/Creatinine Ratio: 12 (ref 12–28)
BUN: 10 mg/dL (ref 8–27)
Bilirubin Total: 0.4 mg/dL (ref 0.0–1.2)
CO2: 24 mmol/L (ref 20–29)
Calcium: 9.2 mg/dL (ref 8.7–10.3)
Chloride: 103 mmol/L (ref 96–106)
Creatinine, Ser: 0.85 mg/dL (ref 0.57–1.00)
Globulin, Total: 2.5 g/dL (ref 1.5–4.5)
Glucose: 92 mg/dL (ref 70–99)
Potassium: 3.4 mmol/L — ABNORMAL LOW (ref 3.5–5.2)
Sodium: 143 mmol/L (ref 134–144)
Total Protein: 6.6 g/dL (ref 6.0–8.5)
eGFR: 77 mL/min/{1.73_m2} (ref >59)

## 2023-10-04 LAB — LIPID PANEL W/O CHOL/HDL RATIO
Cholesterol, Total: 145 mg/dL (ref 100–199)
HDL: 41 mg/dL (ref >39)
LDL Chol Calc (NIH): 83 mg/dL (ref 0–99)
Triglycerides: 114 mg/dL (ref 0–149)
VLDL Cholesterol Cal: 21 mg/dL (ref 5–40)

## 2023-10-04 NOTE — Progress Notes (Signed)
 Needs lab only visit in 2 weeks please Good evening Linda Boyle, your labs have returned and overall are stable with exception of potassium level being mildly low. I recommend increasing foods rich in potassium such as mangoes, bananas, dried fruit, raisin brain, potatoes, orange juice.  I would like to recheck level via outpatient labs in 2 weeks.  Staff will call to schedule.  Any questions? Keep being stellar!!  Thank you for allowing me to participate in your care.  I appreciate you. Kindest regards, Betsy Rosello

## 2023-10-05 NOTE — Progress Notes (Signed)
 Lab appt scheduled.

## 2023-10-08 ENCOUNTER — Ambulatory Visit: Payer: Self-pay | Admitting: Nurse Practitioner

## 2023-10-19 ENCOUNTER — Other Ambulatory Visit

## 2023-10-19 DIAGNOSIS — E876 Hypokalemia: Secondary | ICD-10-CM

## 2023-10-20 ENCOUNTER — Encounter: Payer: Self-pay | Admitting: Nurse Practitioner

## 2023-10-20 ENCOUNTER — Other Ambulatory Visit: Payer: Self-pay | Admitting: Nurse Practitioner

## 2023-10-20 DIAGNOSIS — E876 Hypokalemia: Secondary | ICD-10-CM | POA: Insufficient documentation

## 2023-10-20 LAB — POTASSIUM: Potassium: 3.4 mmol/L — ABNORMAL LOW (ref 3.5–5.2)

## 2023-10-20 MED ORDER — POTASSIUM CHLORIDE CRYS ER 10 MEQ PO TBCR: 10.0000 meq | EXTENDED_RELEASE_TABLET | Freq: Every day | ORAL | 0 refills | Status: DC

## 2023-10-20 NOTE — Addendum Note (Signed)
 Addended by: Jermani Eberlein T on: 10/20/2023 07:23 AM   Modules accepted: Orders

## 2023-10-20 NOTE — Progress Notes (Signed)
 Contacted via MyChart -- needs lab only visit in 7 days please.   Good morning Linda Boyle, your potassium level remains mildly low.  I am going to send in 7 days of potassium to take and help bring level up.  We will recheck outpatient again in 7 days.  Try to ensure good potassium in in diet too: orange juice, bananas, mangoes, avocado, dried fruit, etc..  Any questions? Keep being amazing!!  Thank you for allowing me to participate in your care.  I appreciate you. Kindest regards, Jadee Golebiewski

## 2023-10-22 NOTE — Progress Notes (Signed)
 Lab appt scheduled.

## 2023-10-29 ENCOUNTER — Other Ambulatory Visit

## 2023-10-29 DIAGNOSIS — E876 Hypokalemia: Secondary | ICD-10-CM

## 2023-10-30 ENCOUNTER — Encounter: Payer: Self-pay | Admitting: Nurse Practitioner

## 2023-10-30 LAB — POTASSIUM: Potassium: 3.8 mmol/L (ref 3.5–5.2)

## 2023-10-30 NOTE — Progress Notes (Signed)
 Contacted via MyChart   Potassium level has improved, continue diet focus:)

## 2023-11-30 ENCOUNTER — Other Ambulatory Visit: Payer: Self-pay | Admitting: Nurse Practitioner

## 2023-11-30 DIAGNOSIS — Z1231 Encounter for screening mammogram for malignant neoplasm of breast: Secondary | ICD-10-CM

## 2023-12-24 ENCOUNTER — Ambulatory Visit
Admission: RE | Admit: 2023-12-24 | Discharge: 2023-12-24 | Disposition: A | Discharge status: Home / Self Care | Source: Ambulatory Visit | Attending: Nurse Practitioner | Admitting: Nurse Practitioner

## 2023-12-24 DIAGNOSIS — Z1231 Encounter for screening mammogram for malignant neoplasm of breast: Secondary | ICD-10-CM | POA: Diagnosis present

## 2023-12-26 ENCOUNTER — Ambulatory Visit: Payer: Self-pay | Admitting: Nurse Practitioner

## 2023-12-26 NOTE — Progress Notes (Signed)
 Contacted via MyChart   Normal mammogram, may repeat in one year:)

## 2024-02-11 ENCOUNTER — Ambulatory Visit: Admitting: Nurse Practitioner

## 2024-02-11 ENCOUNTER — Encounter: Payer: Self-pay | Admitting: Nurse Practitioner

## 2024-02-11 VITALS — BP 128/70 | HR 82 | Temp 98.1°F | Ht 61.2 in | Wt 156.2 lb

## 2024-02-11 DIAGNOSIS — B351 Tinea unguium: Secondary | ICD-10-CM

## 2024-02-11 DIAGNOSIS — E1129 Type 2 diabetes mellitus with other diabetic kidney complication: Secondary | ICD-10-CM | POA: Diagnosis not present

## 2024-02-11 DIAGNOSIS — R809 Proteinuria, unspecified: Secondary | ICD-10-CM

## 2024-02-11 DIAGNOSIS — L989 Disorder of the skin and subcutaneous tissue, unspecified: Secondary | ICD-10-CM

## 2024-02-11 NOTE — Assessment & Plan Note (Signed)
 Ongoing stable with diet only control.  A1c 6.2% last visit, well below goal, and urine ALB 80 (March 2025), would benefit ACE or ARB, but is allergic to both.  Recheck in October and adjust plan of care as needed.  Recommend she check BS every morning with goal <130.  Focus on diet control at home.   - Eye exam needed, she will schedule.  Foot exam up to date. - Will get flu vaccine at [facility], Pneumococcal up to date. - Statin on board.

## 2024-02-11 NOTE — Patient Instructions (Signed)

## 2024-02-11 NOTE — Assessment & Plan Note (Signed)
 To right great toenail, will place referral to podiatry for further assessment.

## 2024-02-11 NOTE — Assessment & Plan Note (Signed)
 Saw dermatology and they were to remove, but did not.  Will see podiatry can assess and remove while looking at her great toenail.  Referral placed.

## 2024-02-11 NOTE — Progress Notes (Signed)
 BP 128/70 (BP Location: Left Arm, Patient Position: Sitting)   Pulse 82   Temp 98.1 F (36.7 C) (Oral)   Ht 5' 1.2 (1.554 m)   Wt 156 lb 3.2 oz (70.9 kg)   LMP 10/14/1992 (Approximate)   SpO2 98%   BMI 29.32 kg/m    Subjective:    Patient ID: Linda Boyle, female    DOB: 09/11/1960, 63 y.o.   MRN: [id number]  HPI: Linda Boyle is a 63 y.o. female  Chief Complaint  Patient presents with   Nail Problem    Patient states she has noticed discoloration and a strange shape to the nail on her big toe on her R foot. Requesting to go to podiatry.    DIABETES A1c 6.2% in March.  Continues to be diet controlled. Presents today for request to see podiatry for exam and recommendations. Ongoing discoloration to right great toe and misshapen. Has a small lesion to lateral dorsal left foot, saw dermatology and they were to remove but have not. Hypoglycemic episodes:no Polydipsia/polyuria: no Visual disturbance: no Chest pain: no Paresthesias: no Glucose Monitoring: no             Accucheck frequency: Not Checking             Taking Insulin?: no             Blood Pressure Monitoring: not checking Retinal Examination: Not Up To Date Foot Exam: Up To Date Diabetic Education: Completed Pneumovax: Not Up to Date - needs PCV20 Influenza: Not up to Date Aspirin: no   Relevant past medical, surgical, family and social history reviewed and updated as indicated. Interim medical history since our last visit reviewed. Allergies and medications reviewed and updated.  Review of Systems  Constitutional:  Negative for activity change, appetite change, diaphoresis, fatigue and fever.  Respiratory:  Negative for cough, chest tightness and shortness of breath.   Cardiovascular:  Negative for chest pain, palpitations and leg swelling.  Gastrointestinal: Negative.   Endocrine: Negative.   Neurological: Negative.   Psychiatric/Behavioral: Negative.      Per HPI unless specifically  indicated above     Objective:    BP 128/70 (BP Location: Left Arm, Patient Position: Sitting)   Pulse 82   Temp 98.1 F (36.7 C) (Oral)   Ht 5' 1.2 (1.554 m)   Wt 156 lb 3.2 oz (70.9 kg)   LMP 10/14/1992 (Approximate)   SpO2 98%   BMI 29.32 kg/m   Wt Readings from Last 3 Encounters:  02/11/24 156 lb 3.2 oz (70.9 kg)  10/03/23 154 lb (69.9 kg)  04/09/23 152 lb 9.6 oz (69.2 kg)    Physical Exam Vitals and nursing note reviewed.  Constitutional:      General: She is awake. She is not in acute distress.    Appearance: She is well-developed and well-groomed. She is not ill-appearing or toxic-appearing.  HENT:     Head: Normocephalic.     Right Ear: Hearing and external ear normal.     Left Ear: Hearing and external ear normal.  Eyes:     General: Lids are normal.        Right eye: No discharge.        Left eye: No discharge.     Conjunctiva/sclera: Conjunctivae normal.     Pupils: Pupils are equal, round, and reactive to light.  Neck:     Thyroid: No thyromegaly.     Vascular: No carotid bruit.  Cardiovascular:  Rate and Rhythm: Normal rate and regular rhythm.     Pulses:          Dorsalis pedis pulses are 2+ on the right side and 2+ on the left side.       Posterior tibial pulses are 2+ on the right side and 2+ on the left side.     Heart sounds: Normal heart sounds. No murmur heard.    No gallop.  Pulmonary:     Effort: Pulmonary effort is normal. No accessory muscle usage or respiratory distress.     Breath sounds: Normal breath sounds.  Abdominal:     General: Bowel sounds are normal. There is no distension.     Palpations: Abdomen is soft.     Tenderness: There is no abdominal tenderness.  Musculoskeletal:     Cervical back: Normal range of motion and neck supple.     Right lower leg: No edema.     Left lower leg: No edema.     Right foot: Normal range of motion.     Left foot: Normal range of motion.       Feet:  Feet:     Right foot:     Protective  Sensation: 10 sites tested.  10 sites sensed.     Skin integrity: Skin integrity normal.     Toenail Condition: Right toenails are abnormally thick. Fungal disease present.    Left foot:     Protective Sensation: 10 sites tested.  10 sites sensed.     Toenail Condition: Left toenails are normal.  Lymphadenopathy:     Cervical: No cervical adenopathy.  Skin:    General: Skin is warm and dry.  Neurological:     Mental Status: She is alert and oriented to person, place, and time.     Deep Tendon Reflexes: Reflexes are normal and symmetric.     Reflex Scores:      Brachioradialis reflexes are 2+ on the right side and 2+ on the left side.      Patellar reflexes are 2+ on the right side and 2+ on the left side. Psychiatric:        Attention and Perception: Attention normal.        Mood and Affect: Mood normal.        Speech: Speech normal.        Behavior: Behavior normal. Behavior is cooperative.        Thought Content: Thought content normal.     Results for orders placed or performed in visit on 10/29/23  Potassium   Collection Time: 10/29/23  8:53 AM  Result Value Ref Range   Potassium 3.8 3.5 - 5.2 mmol/L      Assessment & Plan:   Problem List Items Addressed This Visit       Endocrine   Type 2 diabetes mellitus with proteinuria (HCC) - Primary   Ongoing stable with diet only control.  A1c 6.2% last visit, well below goal, and urine ALB 80 (March 2025), would benefit ACE or ARB, but is allergic to both.  Recheck in October and adjust plan of care as needed.  Recommend she check BS every morning with goal <130.  Focus on diet control at home.   - Eye exam needed, she will schedule.  Foot exam up to date. - Will get flu vaccine at [facility], Pneumococcal up to date. - Statin on board.      Relevant Orders   Ambulatory referral to Podiatry  Musculoskeletal and Integument   Skin lesion of foot   Saw dermatology and they were to remove, but did not.  Will see podiatry can  assess and remove while looking at her great toenail.  Referral placed.      Onychomycosis   To right great toenail, will place referral to podiatry for further assessment.      Relevant Orders   Ambulatory referral to Podiatry     Follow up plan: Return for as scheduled October 1st for physical.

## 2024-03-04 ENCOUNTER — Ambulatory Visit (INDEPENDENT_AMBULATORY_CARE_PROVIDER_SITE_OTHER): Admitting: Podiatry

## 2024-03-04 VITALS — Ht 61.2 in | Wt 156.2 lb

## 2024-03-04 DIAGNOSIS — B351 Tinea unguium: Secondary | ICD-10-CM

## 2024-03-04 DIAGNOSIS — M79674 Pain in right toe(s): Secondary | ICD-10-CM

## 2024-03-04 MED ORDER — CICLOPIROX 8 % EX SOLN: Freq: Every day | CUTANEOUS | 1 refills | Status: AC

## 2024-03-04 NOTE — Progress Notes (Signed)
 Chief Complaint  Patient presents with   Nail Problem    Pt is here due to right great toenail, toenail is thick, curved and discolored, states it been like that since last year, she thought it would fall off.    HPI: 63 y.o. female presenting today for concern of thickening with discoloration and sensitivity to the right hallux nail plate onset for about 1 year.  He also has noticed a skin tag to the plantar arch of the left foot that she would like to have evaluated.  Past Medical History:  Diagnosis Date   Allergy not sure   Diabetes mellitus without complication (HCC) Not sure   controlled with diet   GERD (gastroesophageal reflux disease) Not sure   Hepatitis    Hepatitis A   Hyperlipidemia Not sure   Hypertension Not sure   Mitral valve prolapse    Neck sprain    Vertigo     Past Surgical History:  Procedure Laterality Date   ABDOMINAL HYSTERECTOMY  10/1992   partial   COLONOSCOPY WITH PROPOFOL  N/A 11/02/2021   Procedure: COLONOSCOPY WITH PROPOFOL ;  Surgeon: Unk Corinn Skiff, MD;  Location: [facility] ENDOSCOPY;  Service: Gastroenterology;  Laterality: N/A;   DILATION AND CURETTAGE OF UTERUS  8021,8015   EVALUATION UNDER ANESTHESIA WITH HEMORRHOIDECTOMY N/A 12/08/2021   Procedure: EXAM UNDER ANESTHESIA WITH HEMORRHOIDECTOMY;  Surgeon: Desiderio Schanz, MD;  Location: [facility] ORS;  Service: General;  Laterality: N/A;  with excision of anal polyp   TOTAL VAGINAL HYSTERECTOMY  1994   Ovaries remain   TUBAL LIGATION  05/1992    Allergies  Allergen Reactions   Lisinopril     cough   Losartan  Cough     Physical Exam: General: The patient is alert and oriented x3 in no acute distress.  Dermatology: Skin is warm, dry and supple bilateral lower extremities.  Skin tag noted to the plantar arch of the left foot without any concern or malignancy appearance.  Hyperkeratotic dystrophic symptomatic nail noted to the right hallux nail plate  Vascular: Palpable pedal pulses  bilaterally. Capillary refill within normal limits.  No appreciable edema.  No erythema.  Neurological: Grossly intact via light touch  Musculoskeletal Exam: No pedal deformities noted   Assessment/Plan of Care: 1.  Symptomatic painful onychomycosis of toenail right great toe 2.  Benign skin tag left plantar foot  -Patient evaluated -Mechanical debridement of the right hallux nail plate was performed today using a nail nipper and smoothed with a rotary bur.  -Prescription for topical Penlac  8% topical solution apply daily as the nail grows out -for now we will simply allow the Nail to grow out while she applies the topical -The skin tag to the plantar arch of the left foot does not appear to be concerning and has stayed the same size of the last several years.  Will simply observe for now.  He is essentially asymptomatic -Return to clinic PRN       Thresa EMERSON Sar, DPM [facility]  Dr. Thresa EMERSON Sar, DPM    [address] [place], [place] [address]                Office [phone]  Fax [phone]

## 2024-03-20 ENCOUNTER — Telehealth: Payer: Self-pay

## 2024-03-20 DIAGNOSIS — E1159 Type 2 diabetes mellitus with other circulatory complications: Secondary | ICD-10-CM

## 2024-03-27 ENCOUNTER — Telehealth: Payer: Self-pay | Admitting: *Deleted

## 2024-03-27 NOTE — Progress Notes (Signed)
 Complex Care Management Note Care Guide Note  03/27/2024 Name: Linda Boyle MRN: [id number] DOB: 04-27-1961   Complex Care Management Outreach Attempts: An unsuccessful telephone outreach was attempted today to offer the patient information about available complex care management services.  Follow Up Plan:  Additional outreach attempts will be made to offer the patient complex care management information and services.   Encounter Outcome:  No Answer  Thedford Franks, CMA [place]  [facility], [facility] Guide Direct Dial: [phone]  Fax: [phone] Website: [email or web address]

## 2024-03-28 NOTE — Progress Notes (Signed)
 Complex Care Management Note Care Guide Note  03/28/2024 Name: Linda Boyle MRN: [id number] DOB: [birth year]   Complex Care Management Outreach Attempts: A second unsuccessful outreach was attempted today to offer the patient with information about available complex care management services.  Follow Up Plan:  Additional outreach attempts will be made to offer the patient complex care management information and services.   Encounter Outcome:  No Answer  Thedford Franks, CMA [place]  [facility], [facility] Guide Direct Dial: [phone]  Fax: [phone] Website: [email or web address]

## 2024-03-31 NOTE — Progress Notes (Signed)
 Complex Care Management Note Care Guide Note  03/31/2024 Name: Linda Boyle MRN: [id number] DOB: [birth year]   Complex Care Management Outreach Attempts: A third unsuccessful outreach was attempted today to offer the patient with information about available complex care management services.  Follow Up Plan:  No further outreach attempts will be made at this time. We have been unable to contact the patient to offer or enroll patient in complex care management services.  Encounter Outcome:  No Answer  Thedford Franks, CMA [place]  [facility], [facility] Guide Direct Dial: [phone]  Fax: [phone] Website: [email or web address]

## 2024-04-07 NOTE — Patient Instructions (Signed)

## 2024-04-09 ENCOUNTER — Encounter: Payer: Self-pay | Admitting: Nurse Practitioner

## 2024-04-09 ENCOUNTER — Telehealth: Payer: Self-pay

## 2024-04-09 ENCOUNTER — Ambulatory Visit (INDEPENDENT_AMBULATORY_CARE_PROVIDER_SITE_OTHER): Admitting: Nurse Practitioner

## 2024-04-09 ENCOUNTER — Other Ambulatory Visit (HOSPITAL_COMMUNITY): Payer: Self-pay

## 2024-04-09 VITALS — BP 124/78 | HR 66 | Temp 98.5°F | Resp 15 | Ht 61.18 in | Wt 157.4 lb

## 2024-04-09 DIAGNOSIS — Z0000 Encounter for general adult medical examination without abnormal findings: Secondary | ICD-10-CM | POA: Diagnosis not present

## 2024-04-09 DIAGNOSIS — E1159 Type 2 diabetes mellitus with other circulatory complications: Secondary | ICD-10-CM

## 2024-04-09 DIAGNOSIS — Z6828 Body mass index (BMI) 28.0-28.9, adult: Secondary | ICD-10-CM

## 2024-04-09 DIAGNOSIS — E1129 Type 2 diabetes mellitus with other diabetic kidney complication: Secondary | ICD-10-CM

## 2024-04-09 DIAGNOSIS — E1169 Type 2 diabetes mellitus with other specified complication: Secondary | ICD-10-CM | POA: Diagnosis not present

## 2024-04-09 DIAGNOSIS — I152 Hypertension secondary to endocrine disorders: Secondary | ICD-10-CM

## 2024-04-09 DIAGNOSIS — J301 Allergic rhinitis due to pollen: Secondary | ICD-10-CM

## 2024-04-09 DIAGNOSIS — Z23 Encounter for immunization: Secondary | ICD-10-CM | POA: Diagnosis not present

## 2024-04-09 DIAGNOSIS — K760 Fatty (change of) liver, not elsewhere classified: Secondary | ICD-10-CM

## 2024-04-09 DIAGNOSIS — R809 Proteinuria, unspecified: Secondary | ICD-10-CM

## 2024-04-09 DIAGNOSIS — K219 Gastro-esophageal reflux disease without esophagitis: Secondary | ICD-10-CM

## 2024-04-09 DIAGNOSIS — E876 Hypokalemia: Secondary | ICD-10-CM

## 2024-04-09 LAB — BAYER DCA HB A1C WAIVED: HB A1C (BAYER DCA - WAIVED): 6.2 % — ABNORMAL HIGH (ref 4.8–5.6)

## 2024-04-09 MED ORDER — VERAPAMIL HCL ER 180 MG PO CP24: 180.0000 mg | ORAL_CAPSULE | Freq: Every day | ORAL | 4 refills | Status: AC

## 2024-04-09 MED ORDER — OMEPRAZOLE 20 MG PO CPDR: 20.0000 mg | DELAYED_RELEASE_CAPSULE | Freq: Every day | ORAL | 4 refills | Status: AC

## 2024-04-09 MED ORDER — ROSUVASTATIN CALCIUM 40 MG PO TABS: 40.0000 mg | ORAL_TABLET | Freq: Every day | ORAL | 4 refills | Status: AC

## 2024-04-09 NOTE — Assessment & Plan Note (Signed)
Chronic, stable.  Continue current medication regimen and adjust as needed.  Recommend avoiding triggers when possible.

## 2024-04-09 NOTE — Assessment & Plan Note (Signed)
Chronic, stable.  BP well at goal in office today.  Does not tolerate ACE or ARB -- cough and HCTZ drops K+.  Recommend she continue to check BP at least 3 days at week at home and document.  Continue current medication regimen with Verapamil and adjust as needed, refills sent in.  DASH diet focus at home.  Labs:  CBC, CMP, TSH.  Urine ALB 150 March 2024.  Return in 6 months.

## 2024-04-09 NOTE — Assessment & Plan Note (Signed)
 Recheck levels today

## 2024-04-09 NOTE — Progress Notes (Addendum)
 BP 124/78 (BP Location: Left Arm, Patient Position: Sitting, Cuff Size: Large)   Pulse 66   Temp 98.5 F (36.9 C) (Oral)   Resp 15   Ht 5' 1.18 (1.554 m)   Wt 157 lb 6.4 oz (71.4 kg)   LMP 10/14/1992 (Approximate)   SpO2 98%   BMI 29.56 kg/m    Subjective:    Patient ID: Linda Boyle, female    DOB: Dec 05, 1960, 64 y.o.   MRN: [id number]  HPI: Linda Boyle is a 63 y.o. female presenting on 04/09/2024 for comprehensive medical examination. Current medical complaints include:none  She currently lives with: daughter Menopausal Symptoms: no   Takes her Flonase  PRN and Claritin daily consistently for allergic rhinitis.  DIABETES A1c March was 6.2%, continues to be diet controlled. Continues Vitamin D  supplement daily for low levels.  Hepatic steatosis on imaging in past, LFTs stable since 2023. Hypoglycemic episodes:no Polydipsia/polyuria: no Visual disturbance: no Chest pain: no Paresthesias: no Glucose Monitoring: yes  Accucheck frequency: weekly  Fasting glucose: always <130  Post prandial:  Evening:  Before meals: Taking Insulin?: no  Long acting insulin:  Short acting insulin: Blood Pressure Monitoring: a few times a week Retinal Examination: Not Up To Date -- [employer] Foot Exam: Up to Date Pneumovax: Up To Date Influenza: Up To Date Aspirin: no   HTN/HLD Continues to take Verapamil  and Rosuvastatin .    History of Lisinopril and Losartan  = cough presented.  HCTZ caused low K+.  Hypertension status: controlled  Satisfied with current treatment? yes Duration of hypertension: chronic BP monitoring frequency:  not checking BP range:  BP medication side effects:  no Medication compliance: good compliance Aspirin: no Recurrent headaches: no Visual changes: no Palpitations: no Dyspnea: no Chest pain: no Lower extremity edema: no Dizzy/lightheaded: no The 10-year ASCVD risk score (Arnett DK, et al., 2019) is: 14.9%   Values used to  calculate the score:     Age: 50 years     Clincally relevant sex: Female     Is Non-Hispanic African American: Yes     Diabetic: Yes     Tobacco smoker: No     Systolic Blood Pressure: 124 mmHg     Is BP treated: Yes     HDL Cholesterol: 41 mg/dL     Total Cholesterol: 145 mg/dL   GERD Continues Prilosec 20 MG daily. If does not take will get swallowing issues. GERD control status: stable  Satisfied with current treatment? yes Heartburn frequency: none Medication side effects: no  Medication compliance: stable Dysphagia: no Odynophagia:  no Hematemesis: no Blood in stool: no EGD: yes  Depression Screen done today and results listed below:     04/09/2024    9:07 AM 10/03/2023    9:22 AM 04/09/2023    9:04 AM 01/10/2023    2:05 PM 12/19/2022    3:45 PM  Depression screen PHQ 2/9  Decreased Interest 0 0 0 1 1  Down, Depressed, Hopeless 0 0 0 1 1  PHQ - 2 Score 0 0 0 2 2  Altered sleeping 0 0 0 0 0  Tired, decreased energy 0 1 0 1 0  Change in appetite 0 1 1 0 1  Feeling bad or failure about yourself  0 0 1 1 1   Trouble concentrating 0 0 0 0 0  Moving slowly or fidgety/restless 0 0 0 0 0  Suicidal thoughts 0 0 0 0 0  PHQ-9 Score 0 2 2 4  4  Difficult doing work/chores  Not difficult at all Not difficult at all Not difficult at all Not difficult at all      04/09/2024    9:07 AM 10/03/2023    9:22 AM 04/09/2023    9:05 AM 01/10/2023    2:05 PM  GAD 7 : Generalized Anxiety Score  Nervous, Anxious, on Edge 0 0 0 0  Control/stop worrying 0 0 0 0  Worry too much - different things 0 0 0 0  Trouble relaxing 0 0 0 0  Restless 0 0 0 0  Easily annoyed or irritable 0 0 0 1  Afraid - awful might happen 0 0 0 0  Total GAD 7 Score 0 0 0 1  Anxiety Difficulty  Not difficult at all Not difficult at all Not difficult at all      04/09/2024    9:07 AM 10/03/2023    9:22 AM 04/09/2023    9:05 AM 01/10/2023    2:04 PM 12/19/2022    3:45 PM  Fall Risk   Falls in the past year? 0 0 0 0 0   Number falls in past yr: 0 0 0 0 0  Injury with Fall? 0 0 0 0 0  Risk for fall due to : No Fall Risks No Fall Risks No Fall Risks No Fall Risks No Fall Risks  Follow up Falls evaluation completed Falls evaluation completed Falls evaluation completed Falls evaluation completed Falls evaluation completed    Functional Status Survey: Is the patient deaf or have difficulty hearing?: No Does the patient have difficulty seeing, even when wearing glasses/contacts?: No Does the patient have difficulty concentrating, remembering, or making decisions?: No Does the patient have difficulty walking or climbing stairs?: No Does the patient have difficulty dressing or bathing?: No Does the patient have difficulty doing errands alone such as visiting a doctor's office or shopping?: No   Past Medical History:  Past Medical History:  Diagnosis Date  . Allergy not sure  . Diabetes mellitus without complication (HCC) Not sure   controlled with diet  . GERD (gastroesophageal reflux disease) Not sure  . Hepatitis    Hepatitis A  . Hyperlipidemia Not sure  . Hypertension Not sure  . Mitral valve prolapse   . Neck sprain   . Vertigo     Surgical History:  Past Surgical History:  Procedure Laterality Date  . ABDOMINAL HYSTERECTOMY  10/1992   partial  . COLONOSCOPY WITH PROPOFOL  N/A 11/02/2021   Procedure: COLONOSCOPY WITH PROPOFOL ;  Surgeon: Unk Corinn Skiff, MD;  Location: [facility] ENDOSCOPY;  Service: Gastroenterology;  Laterality: N/A;  . DILATION AND CURETTAGE OF UTERUS  8021,8015  . EVALUATION UNDER ANESTHESIA WITH HEMORRHOIDECTOMY N/A 12/08/2021   Procedure: EXAM UNDER ANESTHESIA WITH HEMORRHOIDECTOMY;  Surgeon: Desiderio Schanz, MD;  Location: [facility] ORS;  Service: General;  Laterality: N/A;  with excision of anal polyp  . TOTAL VAGINAL HYSTERECTOMY  1994   Ovaries remain  . TUBAL LIGATION  05/1992    Medications:  Current Outpatient Medications on File Prior to Visit  Medication Sig  .  Cholecalciferol (VITAMIN D -3) 1000 UNITS CAPS Take 1,000 Units by mouth daily.  . ciclopirox  (PENLAC ) 8 % solution Apply topically at bedtime. Apply over nail and surrounding skin. Apply daily over previous coat. After seven (7) days, may remove with alcohol and continue cycle.  . fluticasone  (FLONASE ) 50 MCG/ACT nasal spray Place 2 sprays into both nostrils daily.  SABRA glucose blood (ONETOUCH ULTRA) test strip Use as  instructed  . Lancets (ONETOUCH DELICA PLUS LANCET30G) MISC USE TO CHECK BLOOD SUGAR UP TO 4 TIMES DAILY  . loratadine (CLARITIN) 10 MG tablet Take 10 mg by mouth daily.  . meclizine  (ANTIVERT ) 25 MG tablet Take 1 tablet (25 mg total) by mouth 3 (three) times daily as needed for dizziness.  . Multiple Vitamins-Minerals (MULTIVITAMIN GUMMIES ADULT PO) Take by mouth daily.   No current facility-administered medications on file prior to visit.   Allergies:  Allergies  Allergen Reactions  . Lisinopril     cough  . Losartan  Cough    Social History:  Social History   Socioeconomic History  . Marital status: Widowed    Spouse name: Not on file  . Number of children: 3  . Years of education: Not on file  . Highest education level: Associate degree: occupational, Scientist, product/process development, or vocational program  Occupational History  . Not on file  Tobacco Use  . Smoking status: Never    Passive exposure: Never  . Smokeless tobacco: Never  Vaping Use  . Vaping status: Never Used  Substance and Sexual Activity  . Alcohol use: No  . Drug use: No  . Sexual activity: Not Currently  Other Topics Concern  . Not on file  Social History Narrative  . Not on file   Social Drivers of Health   Financial Resource Strain: Medium Risk (10/03/2022)   Overall Financial Resource Strain (CARDIA)   . Difficulty of Paying Living Expenses: Somewhat hard  Food Insecurity: No Food Insecurity (10/03/2022)   Hunger Vital Sign   . Worried About Programme researcher, broadcasting/film/video in the Last Year: Never true   . Ran Out  of Food in the Last Year: Never true  Transportation Needs: No Transportation Needs (10/03/2022)   PRAPARE - Transportation   . Lack of Transportation (Medical): No   . Lack of Transportation (Non-Medical): No  Physical Activity: Sufficiently Active (10/03/2022)   Exercise Vital Sign   . Days of Exercise per Week: 3 days   . Minutes of Exercise per Session: 60 min  Stress: No Stress Concern Present (10/03/2022)   [employer] of Occupational Health - Occupational Stress Questionnaire   . Feeling of Stress : Only a little  Social Connections: Moderately Integrated (10/03/2022)   Social Connection and Isolation Panel   . Frequency of Communication with Friends and Family: Twice a week   . Frequency of Social Gatherings with Friends and Family: Once a week   . Attends Religious Services: More than 4 times per year   . Active Member of Clubs or Organizations: Yes   . Attends Banker Meetings: 1 to 4 times per year   . Marital Status: Widowed  Intimate Partner Violence: Not At Risk (04/04/2021)   Humiliation, Afraid, Rape, and Kick questionnaire   . Fear of Current or Ex-Partner: No   . Emotionally Abused: No   . Physically Abused: No   . Sexually Abused: No   Social History   Tobacco Use  Smoking Status Never  . Passive exposure: Never  Smokeless Tobacco Never   Social History   Substance and Sexual Activity  Alcohol Use No   Family History:  Family History  Problem Relation Age of Onset  . Diabetes Mother   . Alzheimer's disease Father   . Hypertension Father   . Diabetes Sister   . Hypertension Brother   . Asthma Brother   . Diabetes Brother   . Hypertension Sister   . Breast  cancer Neg Hx    Past medical history, surgical history, medications, allergies, family history and social history reviewed with patient today and changes made to appropriate areas of the chart.   Review of Systems - negative All other ROS negative except what is listed above and  in the HPI.      Objective:    BP 124/78 (BP Location: Left Arm, Patient Position: Sitting, Cuff Size: Large)   Pulse 66   Temp 98.5 F (36.9 C) (Oral)   Resp 15   Ht 5' 1.18 (1.554 m)   Wt 157 lb 6.4 oz (71.4 kg)   LMP 10/14/1992 (Approximate)   SpO2 98%   BMI 29.56 kg/m   Wt Readings from Last 3 Encounters:  04/09/24 157 lb 6.4 oz (71.4 kg)  03/04/24 156 lb 3.2 oz (70.9 kg)  02/11/24 156 lb 3.2 oz (70.9 kg)    Physical Exam Vitals and nursing note reviewed. Exam conducted with a chaperone present.  Constitutional:      General: She is awake. She is not in acute distress.    Appearance: She is well-developed and well-groomed. She is not ill-appearing or toxic-appearing.  HENT:     Head: Normocephalic and atraumatic.     Right Ear: Hearing, tympanic membrane, ear canal and external ear normal. No drainage.     Left Ear: Hearing, tympanic membrane, ear canal and external ear normal. No drainage.     Nose: Nose normal.     Right Sinus: No maxillary sinus tenderness or frontal sinus tenderness.     Left Sinus: No maxillary sinus tenderness or frontal sinus tenderness.     Mouth/Throat:     Mouth: Mucous membranes are moist.     Pharynx: Oropharynx is clear. Uvula midline. No pharyngeal swelling, oropharyngeal exudate or posterior oropharyngeal erythema.  Eyes:     General: Lids are normal.        Right eye: No discharge.        Left eye: No discharge.     Extraocular Movements: Extraocular movements intact.     Conjunctiva/sclera: Conjunctivae normal.     Pupils: Pupils are equal, round, and reactive to light.     Visual Fields: Right eye visual fields normal and left eye visual fields normal.  Neck:     Thyroid: No thyromegaly.     Vascular: No carotid bruit.     Trachea: Trachea normal.  Cardiovascular:     Rate and Rhythm: Normal rate and regular rhythm.     Heart sounds: Normal heart sounds. No murmur heard.    No gallop.  Pulmonary:     Effort: Pulmonary effort  is normal. No accessory muscle usage or respiratory distress.     Breath sounds: Normal breath sounds.  Chest:  Breasts:    Right: Normal.     Left: Normal.  Abdominal:     General: Bowel sounds are normal.     Palpations: Abdomen is soft. There is no hepatomegaly or splenomegaly.     Tenderness: There is no abdominal tenderness.  Musculoskeletal:        General: Normal range of motion.     Cervical back: Normal range of motion and neck supple.     Right lower leg: No edema.     Left lower leg: No edema.  Lymphadenopathy:     Head:     Right side of head: No submental, submandibular, tonsillar, preauricular or posterior auricular adenopathy.     Left side of head: No  submental, submandibular, tonsillar, preauricular or posterior auricular adenopathy.     Cervical: No cervical adenopathy.     Upper Body:     Right upper body: No supraclavicular, axillary or pectoral adenopathy.     Left upper body: No supraclavicular, axillary or pectoral adenopathy.  Skin:    General: Skin is warm and dry.     Capillary Refill: Capillary refill takes less than 2 seconds.     Findings: No rash.  Neurological:     Mental Status: She is alert and oriented to person, place, and time.     Gait: Gait is intact.     Deep Tendon Reflexes: Reflexes are normal and symmetric.     Reflex Scores:      Brachioradialis reflexes are 2+ on the right side and 2+ on the left side.      Patellar reflexes are 2+ on the right side and 2+ on the left side. Psychiatric:        Attention and Perception: Attention normal.        Mood and Affect: Mood normal.        Speech: Speech normal.        Behavior: Behavior normal. Behavior is cooperative.        Thought Content: Thought content normal.        Judgment: Judgment normal.    Results for orders placed or performed in visit on 10/29/23  Potassium   Collection Time: 10/29/23  8:53 AM  Result Value Ref Range   Potassium 3.8 3.5 - 5.2 mmol/L      Assessment &  Plan:   Problem List Items Addressed This Visit       Cardiovascular and Mediastinum   Hypertension associated with diabetes (HCC)   Chronic, stable.  BP well at goal in office today.  Does not tolerate ACE or ARB -- cough and HCTZ drops K+.  Recommend she continue to check BP at least 3 days at week at home and document.  Continue current medication regimen with Verapamil  and adjust as needed, refills sent in.  DASH diet focus at home.  Labs:  CBC, CMP, TSH.  Urine ALB 150 March 2024.  Return in 6 months.      Relevant Medications   rosuvastatin  (CRESTOR ) 40 MG tablet   verapamil  (VERELAN ) 180 MG 24 hr capsule   Other Relevant Orders   Bayer DCA Hb A1c Waived   CBC with Differential/Platelet   Comprehensive metabolic panel with GFR   TSH     Respiratory   Allergic rhinitis   Chronic, stable.  Continue current medication regimen and adjust as needed.  Recommend avoiding triggers when possible.        Digestive   Hepatic steatosis   Noted on imaging 10/11/21 with some elevation in LFTs in past.  Will recheck labs today.  Recommend heavy focus on diet changes, which she reports she had been reading about online.  If worsening or ongoing trend up LFTs then will consider GI referral.  Diabetes currently well-controlled.      Relevant Orders   Bayer DCA Hb A1c Waived   Comprehensive metabolic panel with GFR   GERD (gastroesophageal reflux disease)   Chronic, stable.  Continue current medication regimen and adjust as needed.  Educated on risks of long term PPI use and wishes to continue.  Check Mag level annually. Risks of PPI use were discussed with patient including bone loss, C. Diff diarrhea, pneumonia, infections, CKD, electrolyte abnormalities. Verbalizes understanding and  chooses to continue the medication.      Relevant Medications   omeprazole  (PRILOSEC) 20 MG capsule   Other Relevant Orders   Magnesium     Endocrine   Type 2 diabetes mellitus with proteinuria (HCC) -  Primary   Ongoing stable with diet only control.  A1c 6.2% last visit, well below goal, and urine ALB 80 (March 2025), would benefit ACE or ARB, but is allergic to both.  Recheck today and adjust plan of care as needed.  Recommend she check BS every morning with goal <130.  Focus on diet control at home.   - Eye exam needed, she will schedule.  Foot exam up to date. - Vaccines up to date. - Statin on board.      Relevant Medications   rosuvastatin  (CRESTOR ) 40 MG tablet   Hyperlipidemia associated with type 2 diabetes mellitus (HCC)   Chronic, ongoing.  Check lipid panel.  Continue Rosuvastatin  as ordered, refills sent in. Fasting labs.      Relevant Medications   rosuvastatin  (CRESTOR ) 40 MG tablet   verapamil  (VERELAN ) 180 MG 24 hr capsule   Other Relevant Orders   Bayer DCA Hb A1c Waived   Lipid Panel w/o Chol/HDL Ratio     Other   Hypokalemia   Recheck levels today.      Relevant Orders   Comprehensive metabolic panel with GFR   BMI 71.9-71.0,jilou   BMI 29.56.  Recommended eating smaller high protein, low fat meals more frequently and exercising 30 mins a day 5 times a week with a goal of 10-15lb weight loss in the next 3 months. Patient voiced their understanding and motivation to adhere to these recommendations.       Other Visit Diagnoses       Flu vaccine need       Flu vaccine today, educated on this.   Relevant Orders   Flu vaccine trivalent PF, 6mos and older(Flulaval,Afluria,Fluarix,Fluzone) (Completed)     Encounter for annual physical exam       Annual physical today with labs and health maintenance reviewed, discussed with patient.        Follow up plan: Return in about 6 months (around 10/08/2024) for T2DM, HTN/HLD.   LABORATORY TESTING:  - Pap smear: not applicable -- no cervix  IMMUNIZATIONS:   - Tdap: Tetanus vaccination status reviewed: last tetanus booster within 10 years. - Influenza: Up To Date - Pneumovax: Up To Date  - Prevnar: Up To  Date - HPV: Not applicable - Zostavax vaccine: Not Up To Date  SCREENING: -Mammogram: Up to date -- last 12/24/23 - Colonoscopy: Up to date -- due 11/03/26 - Bone Density: Not applicable  -Hearing Test: Not applicable  -Spirometry: Not applicable   PATIENT COUNSELING:   Advised to take 1 mg of folate supplement per day if capable of pregnancy.   Sexuality: Discussed sexually transmitted diseases, partner selection, use of condoms, avoidance of unintended pregnancy  and contraceptive alternatives.   Advised to avoid cigarette smoking.  I discussed with the patient that most people either abstain from alcohol or drink within safe limits (<=14/week and <=4 drinks/occasion for males, <=7/weeks and <= 3 drinks/occasion for females) and that the risk for alcohol disorders and other health effects rises proportionally with the number of drinks per week and how often a drinker exceeds daily limits.  Discussed cessation/primary prevention of drug use and availability of treatment for abuse.   Diet: Encouraged to adjust caloric intake to maintain  or  achieve ideal body weight, to reduce intake of dietary saturated fat and total fat, to limit sodium intake by avoiding high sodium foods and not adding table salt, and to maintain adequate dietary potassium and calcium  preferably from fresh fruits, vegetables, and low-fat dairy products.    Stressed the importance of regular exercise  Injury prevention: Discussed safety belts, safety helmets, smoke detector, smoking near bedding or upholstery.   Dental health: Discussed importance of regular tooth brushing, flossing, and dental visits.    NEXT PREVENTATIVE PHYSICAL DUE IN 1 YEAR. Return in about 6 months (around 10/08/2024) for T2DM, HTN/HLD.

## 2024-04-09 NOTE — Assessment & Plan Note (Signed)
 BMI 29.56.  Recommended eating smaller high protein, low fat meals more frequently and exercising 30 mins a day 5 times a week with a goal of 10-15lb weight loss in the next 3 months. Patient voiced their understanding and motivation to adhere to these recommendations.

## 2024-04-09 NOTE — Assessment & Plan Note (Signed)
Chronic, ongoing.  Check lipid panel.  Continue Rosuvastatin as ordered, refills sent in. Fasting labs.

## 2024-04-09 NOTE — Assessment & Plan Note (Signed)
 Ongoing stable with diet only control.  A1c 6.2% last visit, well below goal, and urine ALB 80 (March 2025), would benefit ACE or ARB, but is allergic to both.  Recheck today and adjust plan of care as needed.  Recommend she check BS every morning with goal <130.  Focus on diet control at home.   - Eye exam needed, she will schedule.  Foot exam up to date. - Vaccines up to date. - Statin on board.

## 2024-04-09 NOTE — Assessment & Plan Note (Signed)
Noted on imaging 10/11/21 with some elevation in LFTs in past.  Will recheck labs today.  Recommend heavy focus on diet changes, which she reports she had been reading about online.  If worsening or ongoing trend up LFTs then will consider GI referral.  Diabetes currently well-controlled.

## 2024-04-09 NOTE — Telephone Encounter (Signed)
 Pharmacy Patient Advocate Encounter   Received notification from Onbase that prior authorization for Verapamil  HCl ER 180MG  er capsules  is required/requested.   Insurance verification completed.   The patient is insured through [place] [employer].   Per test claim: PA required; PA submitted to above mentioned insurance via Latent Key/confirmation #/EOC [id number] Status is pending

## 2024-04-09 NOTE — Assessment & Plan Note (Signed)
Chronic, stable.  Continue current medication regimen and adjust as needed.  Educated on risks of long term PPI use and wishes to continue.  Check Mag level annually. Risks of PPI use were discussed with patient including bone loss, C. Diff diarrhea, pneumonia, infections, CKD, electrolyte abnormalities. Verbalizes understanding and chooses to continue the medication.

## 2024-04-10 ENCOUNTER — Other Ambulatory Visit (HOSPITAL_COMMUNITY): Payer: Self-pay

## 2024-04-10 ENCOUNTER — Ambulatory Visit: Payer: Self-pay | Admitting: Nurse Practitioner

## 2024-04-10 DIAGNOSIS — R7989 Other specified abnormal findings of blood chemistry: Secondary | ICD-10-CM

## 2024-04-10 LAB — COMPREHENSIVE METABOLIC PANEL WITH GFR
ALT: 15 IU/L (ref 0–32)
AST: 23 IU/L (ref 0–40)
Albumin: 4.4 g/dL (ref 3.9–4.9)
Alkaline Phosphatase: 64 IU/L (ref 49–135)
BUN/Creatinine Ratio: 12 (ref 12–28)
BUN: 10 mg/dL (ref 8–27)
Bilirubin Total: 0.5 mg/dL (ref 0.0–1.2)
CO2: 25 mmol/L (ref 20–29)
Calcium: 9.7 mg/dL (ref 8.7–10.3)
Chloride: 102 mmol/L (ref 96–106)
Creatinine, Ser: 0.86 mg/dL (ref 0.57–1.00)
Globulin, Total: 2.4 g/dL (ref 1.5–4.5)
Glucose: 96 mg/dL (ref 70–99)
Potassium: 3.6 mmol/L (ref 3.5–5.2)
Sodium: 141 mmol/L (ref 134–144)
Total Protein: 6.8 g/dL (ref 6.0–8.5)
eGFR: 75 mL/min/1.73 (ref >59)

## 2024-04-10 LAB — CBC WITH DIFFERENTIAL/PLATELET
Basophils Absolute: 0 x10E3/uL (ref 0.0–0.2)
Basos: 1 %
EOS (ABSOLUTE): 0.1 x10E3/uL (ref 0.0–0.4)
Eos: 4 %
Hematocrit: 43 % (ref 34.0–46.6)
Hemoglobin: 14 g/dL (ref 11.1–15.9)
Immature Grans (Abs): 0 x10E3/uL (ref 0.0–0.1)
Immature Granulocytes: 0 %
Lymphocytes Absolute: 1.4 x10E3/uL (ref 0.7–3.1)
Lymphs: 42 %
MCH: 30 pg (ref 26.6–33.0)
MCHC: 32.6 g/dL (ref 31.5–35.7)
MCV: 92 fL (ref 79–97)
Monocytes Absolute: 0.3 x10E3/uL (ref 0.1–0.9)
Monocytes: 9 %
Neutrophils Absolute: 1.5 x10E3/uL (ref 1.4–7.0)
Neutrophils: 44 %
Platelets: 273 x10E3/uL (ref 150–450)
RBC: 4.66 x10E6/uL (ref 3.77–5.28)
RDW: 13.7 % (ref 11.7–15.4)
WBC: 3.3 x10E3/uL — ABNORMAL LOW (ref 3.4–10.8)

## 2024-04-10 LAB — LIPID PANEL W/O CHOL/HDL RATIO
Cholesterol, Total: 178 mg/dL (ref 100–199)
HDL: 55 mg/dL (ref >39)
LDL Chol Calc (NIH): 103 mg/dL — ABNORMAL HIGH (ref 0–99)
Triglycerides: 110 mg/dL (ref 0–149)
VLDL Cholesterol Cal: 20 mg/dL (ref 5–40)

## 2024-04-10 LAB — TSH: TSH: 1.89 u[IU]/mL (ref 0.450–4.500)

## 2024-04-10 LAB — MAGNESIUM: Magnesium: 2.4 mg/dL — ABNORMAL HIGH (ref 1.6–2.3)

## 2024-04-10 NOTE — Progress Notes (Signed)
 Contacted via MyChart -- outpatient lab in 4 weeks please  Good afternoon Heron, your labs have returned: - CBC is showing slightly low white blood cell count, I would like to recheck this in 4 weeks to see if returns to normal since it can fluctuate. - Magnesium mildly elevated, if taking magnesium supplement cut back to a few days a week. - Lipid panel showing trend up in LDL, bad cholesterol, are you taking Rosuvastatin  daily? If so let me know as we may need to change dosing. - Kidney function, creatinine and eGFR, remains normal, as is liver function, AST and ALT.  - Thyroid level normal.  Any questions? Keep being amazing!!  Thank you for allowing me to participate in your care.  I appreciate you. Kindest regards, Devann Cribb

## 2024-04-10 NOTE — Telephone Encounter (Signed)
 Pharmacy Patient Advocate Encounter  Received notification from [employer] that Prior Authorization for Verapamil  HCl ER 180MG  er capsules  has been APPROVED from 04/10/24 to 04/10/25. Ran test claim, Copay is $4. This test claim was processed through [facility]- copay amounts may vary at other pharmacies due to pharmacy/plan contracts, or as the patient moves through the different stages of their insurance plan.   PA #/Case ID/Reference #: [id number]   *spoke with [facility] to reprocess

## 2024-04-12 MED ORDER — EZETIMIBE 10 MG PO TABS: 10.0000 mg | ORAL_TABLET | Freq: Every day | ORAL | 3 refills | Status: AC

## 2024-04-12 NOTE — Addendum Note (Signed)
 Addended by: Grady Mohabir T on: 04/12/2024 09:47 AM   Modules accepted: Orders

## 2024-04-30 ENCOUNTER — Encounter: Payer: Self-pay | Admitting: Nurse Practitioner

## 2024-04-30 ENCOUNTER — Ambulatory Visit (INDEPENDENT_AMBULATORY_CARE_PROVIDER_SITE_OTHER): Admitting: Nurse Practitioner

## 2024-04-30 VITALS — BP 115/76 | HR 68 | Temp 98.4°F | Resp 15 | Ht 61.18 in | Wt 158.0 lb

## 2024-04-30 DIAGNOSIS — M79672 Pain in left foot: Secondary | ICD-10-CM | POA: Insufficient documentation

## 2024-04-30 NOTE — Assessment & Plan Note (Signed)
 Ongoing for a few weeks, tried rolling with ice water  bottle and stretches but has ongoing discomfort noted mainly when she initially gets up.  Recommend starting Voltaren gel at home and wearing compression sleeve + supportive shoes. Will get imaging.  Discussed with her if ongoing pain may need to get her into podiatry or could consider PT.

## 2024-04-30 NOTE — Patient Instructions (Signed)
 IMAGING LOCATION: [id number], [place], [place] [address] [phone]   Ankle Pain The ankle joint helps you stand on your leg and allows you to move around. Ankle pain can happen on either side or the back of the ankle. You may have pain in one ankle or both ankles. Ankle pain may be sharp and burning or dull and aching. There may be tenderness, stiffness, redness, or warmth around the ankle. Many things can cause ankle pain. These include an injury to the area and overuse of your ankle. Follow these instructions at home: Activity Rest your ankle as told by your health care provider. Avoid doing things that cause ankle pain. Do not use the injured limb to support your body weight until your provider says that you can. Use crutches as told by your provider. Ask your provider when it is safe to drive if you have a brace on your ankle. Do exercises as told by your provider. If you have a removable brace: Wear the brace as told by your provider. Remove it only as told by your provider. Check the skin around the brace every day. Tell your provider about any concerns. Loosen the brace if your toes tingle, become numb, or turn cold and blue. Keep the brace clean. If the brace is not waterproof: Do not let it get wet. Cover it with a watertight covering when you take a bath or shower. If you have an elastic bandage:  Remove it when you take a bath or a shower. Try not to move your ankle much. Wiggle your toes from time to time. This helps to prevent swelling. Adjust the bandage if it feels too tight. Loosen the bandage if your foot tingles, becomes numb, or turns cold and blue. Managing pain, stiffness, and swelling  If told, put ice on the painful area. If you have a removable brace or elastic bandage, remove it as told by your provider. Put ice in a plastic bag. Place a towel between your skin and the bag. Leave the ice on for 20 minutes, 2-3 times a day. If your skin turns  bright red, remove the ice right away to prevent skin damage. The risk of damage is higher if you cannot feel pain, heat, or cold. Move your toes often to reduce stiffness and swelling. Raise (elevate) your ankle above the level of your heart while you are sitting or lying down. General instructions Take over-the-counter and prescription medicines only as told by your provider. To help you and your provider, write down: How often you have ankle pain. Where the pain is. What the pain feels like. If you are told to wear a certain shoe or insole, make sure you wear it the right way and for as long as you are told. Contact a health care provider if: Your pain gets worse. Your pain does not get better with medicine. You have a fever or chills. You have more trouble walking. You have new symptoms. Your foot, leg, toes, or ankle tingles, becomes numb or swollen, or turns cold and blue. This information is not intended to replace advice given to you by your health care provider. Make sure you discuss any questions you have with your health care provider. Document Revised: 04/19/2022 Document Reviewed: 04/19/2022 Elsevier Patient Education  2024 [employer].

## 2024-04-30 NOTE — Progress Notes (Signed)
 BP 115/76 (BP Location: Left Arm, Patient Position: Sitting, Cuff Size: Large)   Pulse 68   Temp 98.4 F (36.9 C) (Oral)   Resp 15   Ht 5' 1.18 (1.554 m)   Wt 158 lb (71.7 kg)   LMP 10/14/1992 (Approximate)   SpO2 98%   BMI 29.68 kg/m    Subjective:    Patient ID: Linda Boyle, female    DOB: [birth year], 63 y.o.   MRN: [id number]  HPI: Linda Boyle is a 63 y.o. female  Chief Complaint  Patient presents with   Ankle Pain    Left ankle- still pain ongoing. Sit to stand very painful. Feels it a lot in her ankle.    FOOT PAIN Left ankle pain for a few weeks.  No recent injuries.  Started out of the blue. She tried rolling as advised by PCP with frozen water  bottle and stretching. Duration: weeks Involved foot: left -- to medial ankle and then lower foot in arch Mechanism of injury: unknown Location: left Onset: gradual  Severity: mild -- notices more if sits a bit and gets back up Quality:  dull, aching, throbbing. tender Frequency: intermittent Radiation: no Aggravating factors: weight bearing, walking, and movement notices pain initially after getting up, but after has walked awhile it gets better Alleviating factors: nothing Status: fluctuating Treatments attempted:  ice, APAP, NSAIDs, and rest   Relief with NSAIDs?:  no Weakness with weight bearing or walking: occasional Morning stiffness: yes initially when gets up Swelling: no Redness: no Bruising: no Paresthesias / decreased sensation: no  Fevers:no   Relevant past medical, surgical, family and social history reviewed and updated as indicated. Interim medical history since our last visit reviewed. Allergies and medications reviewed and updated.  Review of Systems  Constitutional:  Negative for activity change, appetite change, diaphoresis, fatigue and fever.  Respiratory:  Negative for cough, chest tightness and shortness of breath.   Cardiovascular:  Negative for chest pain, palpitations and leg  swelling.  Gastrointestinal: Negative.   Endocrine: Negative.   Musculoskeletal:  Positive for arthralgias.  Neurological: Negative.   Psychiatric/Behavioral: Negative.      Per HPI unless specifically indicated above     Objective:    BP 115/76 (BP Location: Left Arm, Patient Position: Sitting, Cuff Size: Large)   Pulse 68   Temp 98.4 F (36.9 C) (Oral)   Resp 15   Ht 5' 1.18 (1.554 m)   Wt 158 lb (71.7 kg)   LMP 10/14/1992 (Approximate)   SpO2 98%   BMI 29.68 kg/m   Wt Readings from Last 3 Encounters:  04/30/24 158 lb (71.7 kg)  04/09/24 157 lb 6.4 oz (71.4 kg)  03/04/24 156 lb 3.2 oz (70.9 kg)    Physical Exam Vitals and nursing note reviewed.  Constitutional:      General: She is awake. She is not in acute distress.    Appearance: She is well-developed and well-groomed. She is not ill-appearing or toxic-appearing.  HENT:     Head: Normocephalic.     Right Ear: Hearing and external ear normal.     Left Ear: Hearing and external ear normal.  Eyes:     General: Lids are normal.        Right eye: No discharge.        Left eye: No discharge.     Conjunctiva/sclera: Conjunctivae normal.     Pupils: Pupils are equal, round, and reactive to light.  Neck:     Thyroid:  No thyromegaly.     Vascular: No carotid bruit.  Cardiovascular:     Rate and Rhythm: Normal rate and regular rhythm.     Pulses:          Dorsalis pedis pulses are 2+ on the right side and 2+ on the left side.       Posterior tibial pulses are 2+ on the right side and 2+ on the left side.     Heart sounds: Normal heart sounds. No murmur heard.    No gallop.  Pulmonary:     Effort: Pulmonary effort is normal. No accessory muscle usage or respiratory distress.     Breath sounds: Normal breath sounds.  Abdominal:     General: Bowel sounds are normal. There is no distension.     Palpations: Abdomen is soft.     Tenderness: There is no abdominal tenderness.  Musculoskeletal:     Cervical back:  Normal range of motion and neck supple.     Right lower leg: No edema.     Left lower leg: No edema.     Right foot: Normal range of motion. No foot drop.     Left foot: Normal range of motion. No foot drop.  Feet:     Right foot:     Protective Sensation: 10 sites tested.  10 sites sensed.     Skin integrity: Skin integrity normal.     Toenail Condition: Right toenails are normal.     Left foot:     Protective Sensation: 10 sites tested.  10 sites sensed.     Skin integrity: Skin integrity normal.     Toenail Condition: Left toenails are normal.     Comments: No tenderness on palpation of left ankle or heel area. No rashes. Slightly antalgic gait when initially gets up. Lymphadenopathy:     Cervical: No cervical adenopathy.  Skin:    General: Skin is warm and dry.  Neurological:     Mental Status: She is alert and oriented to person, place, and time.     Deep Tendon Reflexes: Reflexes are normal and symmetric.     Reflex Scores:      Brachioradialis reflexes are 2+ on the right side and 2+ on the left side.      Patellar reflexes are 2+ on the right side and 2+ on the left side. Psychiatric:        Attention and Perception: Attention normal.        Mood and Affect: Mood normal.        Speech: Speech normal.        Behavior: Behavior normal. Behavior is cooperative.        Thought Content: Thought content normal.     Results for orders placed or performed in visit on 04/09/24  Bayer DCA Hb A1c Waived   Collection Time: 04/09/24  9:50 AM  Result Value Ref Range   HB A1C (BAYER DCA - WAIVED) 6.2 (H) 4.8 - 5.6 %  CBC with Differential/Platelet   Collection Time: 04/09/24  9:51 AM  Result Value Ref Range   WBC 3.3 (L) 3.4 - 10.8 x10E3/uL   RBC 4.66 3.77 - 5.28 x10E6/uL   Hemoglobin 14.0 11.1 - 15.9 g/dL   Hematocrit 56.9 65.9 - 46.6 %   MCV 92 79 - 97 fL   MCH 30.0 26.6 - 33.0 pg   MCHC 32.6 31.5 - 35.7 g/dL   RDW 86.2 88.2 - 84.5 %   Platelets 273  150 - 450 x10E3/uL    Neutrophils 44 Not Estab. %   Lymphs 42 Not Estab. %   Monocytes 9 Not Estab. %   Eos 4 Not Estab. %   Basos 1 Not Estab. %   Neutrophils Absolute 1.5 1.4 - 7.0 x10E3/uL   Lymphocytes Absolute 1.4 0.7 - 3.1 x10E3/uL   Monocytes Absolute 0.3 0.1 - 0.9 x10E3/uL   EOS (ABSOLUTE) 0.1 0.0 - 0.4 x10E3/uL   Basophils Absolute 0.0 0.0 - 0.2 x10E3/uL   Immature Granulocytes 0 Not Estab. %   Immature Grans (Abs) 0.0 0.0 - 0.1 x10E3/uL  Comprehensive metabolic panel with GFR   Collection Time: 04/09/24  9:51 AM  Result Value Ref Range   Glucose 96 70 - 99 mg/dL   BUN 10 8 - 27 mg/dL   Creatinine, Ser 9.13 0.57 - 1.00 mg/dL   eGFR 75 >40 fO/fpw/8.26   BUN/Creatinine Ratio 12 12 - 28   Sodium 141 134 - 144 mmol/L   Potassium 3.6 3.5 - 5.2 mmol/L   Chloride 102 96 - 106 mmol/L   CO2 25 20 - 29 mmol/L   Calcium  9.7 8.7 - 10.3 mg/dL   Total Protein 6.8 6.0 - 8.5 g/dL   Albumin 4.4 3.9 - 4.9 g/dL   Globulin, Total 2.4 1.5 - 4.5 g/dL   Bilirubin Total 0.5 0.0 - 1.2 mg/dL   Alkaline Phosphatase 64 49 - 135 IU/L   AST 23 0 - 40 IU/L   ALT 15 0 - 32 IU/L  TSH   Collection Time: 04/09/24  9:51 AM  Result Value Ref Range   TSH 1.890 0.450 - 4.500 uIU/mL  Magnesium   Collection Time: 04/09/24  9:51 AM  Result Value Ref Range   Magnesium 2.4 (H) 1.6 - 2.3 mg/dL  Lipid Panel w/o Chol/HDL Ratio   Collection Time: 04/09/24  9:51 AM  Result Value Ref Range   Cholesterol, Total 178 100 - 199 mg/dL   Triglycerides 889 0 - 149 mg/dL   HDL 55 >60 mg/dL   VLDL Cholesterol Cal 20 5 - 40 mg/dL   LDL Chol Calc (NIH) 896 (H) 0 - 99 mg/dL      Assessment & Plan:   Problem List Items Addressed This Visit       Other   Pain of left heel - Primary   Ongoing for a few weeks, tried rolling with ice water  bottle and stretches but has ongoing discomfort noted mainly when she initially gets up.  Recommend starting Voltaren gel at home and wearing compression sleeve + supportive shoes. Will get imaging.   Discussed with her if ongoing pain may need to get her into podiatry or could consider PT.      Relevant Orders   DG Ankle Complete Left   DG Foot Complete Left     Follow up plan: Return in about 4 weeks (around 05/28/2024) for ANKLE PAIN.

## 2024-05-02 ENCOUNTER — Ambulatory Visit: Payer: Self-pay | Admitting: Nurse Practitioner

## 2024-05-02 ENCOUNTER — Ambulatory Visit
Admission: RE | Admit: 2024-05-02 | Discharge: 2024-05-02 | Disposition: A | Discharge status: Home / Self Care | Attending: Nurse Practitioner | Admitting: Nurse Practitioner

## 2024-05-02 ENCOUNTER — Ambulatory Visit
Admission: RE | Admit: 2024-05-02 | Discharge: 2024-05-02 | Disposition: A | Discharge status: Home / Self Care | Source: Ambulatory Visit | Attending: Nurse Practitioner | Admitting: Nurse Practitioner

## 2024-05-02 DIAGNOSIS — M79672 Pain in left foot: Secondary | ICD-10-CM | POA: Diagnosis present

## 2024-05-02 NOTE — Progress Notes (Signed)
 Contacted via MyChart  Good evening Nakia, your imaging has returned. Left ankle is normal.  Left heel is showing a bone spur which I suspect is cause of your current pain.  This is a bony growth that forms often due to repetitive movement. I would recommend if pain continues then a visit to podiatry may be beneficial.  Any questions? Keep being amazing!!  Thank you for allowing me to participate in your care.  I appreciate you. Kindest regards, Feleica Fulmore

## 2024-05-02 NOTE — Progress Notes (Signed)
 Sent message via ankle imaging

## 2024-06-01 NOTE — Patient Instructions (Signed)
 Be Involved in Caring For Your Health:  Taking Medications When medications are taken as directed, they can greatly improve your health. But if they are not taken as prescribed, they may not work. In some cases, not taking them correctly can be harmful. To help ensure your treatment remains effective and safe, understand your medications and how to take them. Bring your medications to each visit for review by your provider.  Your lab results, notes, and after visit summary will be available on My Chart. We strongly encourage you to use this feature. If lab results are abnormal the clinic will contact you with the appropriate steps. If the clinic does not contact you assume the results are satisfactory. You can always view your results on My Chart. If you have questions regarding your health or results, please contact the clinic during office hours. You can also ask questions on My Chart.  We at [facility] are grateful that you chose us  to provide your care. We strive to provide evidence-based and compassionate care and are always looking for feedback. If you get a survey from the clinic please complete this so we can hear your opinions.   Ankle Pain The ankle joint helps you stand on your leg and allows you to move around. Ankle pain can happen on either side or the back of the ankle. You may have pain in one ankle or both ankles. Ankle pain may be sharp and burning or dull and aching. There may be tenderness, stiffness, redness, or warmth around the ankle. Many things can cause ankle pain. These include an injury to the area and overuse of your ankle. Follow these instructions at home: Activity Rest your ankle as told by your health care provider. Avoid doing things that cause ankle pain. Do not use the injured limb to support your body weight until your provider says that you can. Use crutches as told by your provider. Ask your provider when it is safe to drive if you have a brace on  your ankle. Do exercises as told by your provider. If you have a removable brace: Wear the brace as told by your provider. Remove it only as told by your provider. Check the skin around the brace every day. Tell your provider about any concerns. Loosen the brace if your toes tingle, become numb, or turn cold and blue. Keep the brace clean. If the brace is not waterproof: Do not let it get wet. Cover it with a watertight covering when you take a bath or shower. If you have an elastic bandage:  Remove it when you take a bath or a shower. Try not to move your ankle much. Wiggle your toes from time to time. This helps to prevent swelling. Adjust the bandage if it feels too tight. Loosen the bandage if your foot tingles, becomes numb, or turns cold and blue. Managing pain, stiffness, and swelling  If told, put ice on the painful area. If you have a removable brace or elastic bandage, remove it as told by your provider. Put ice in a plastic bag. Place a towel between your skin and the bag. Leave the ice on for 20 minutes, 2-3 times a day. If your skin turns bright red, remove the ice right away to prevent skin damage. The risk of damage is higher if you cannot feel pain, heat, or cold. Move your toes often to reduce stiffness and swelling. Raise (elevate) your ankle above the level of your heart while you are  sitting or lying down. General instructions Take over-the-counter and prescription medicines only as told by your provider. To help you and your provider, write down: How often you have ankle pain. Where the pain is. What the pain feels like. If you are told to wear a certain shoe or insole, make sure you wear it the right way and for as long as you are told. Contact a health care provider if: Your pain gets worse. Your pain does not get better with medicine. You have a fever or chills. You have more trouble walking. You have new symptoms. Your foot, leg, toes, or ankle tingles,  becomes numb or swollen, or turns cold and blue. This information is not intended to replace advice given to you by your health care provider. Make sure you discuss any questions you have with your health care provider. Document Revised: 04/19/2022 Document Reviewed: 04/19/2022 Elsevier Patient Education  2024 [employer].

## 2024-06-03 ENCOUNTER — Ambulatory Visit (INDEPENDENT_AMBULATORY_CARE_PROVIDER_SITE_OTHER): Admitting: Nurse Practitioner

## 2024-06-03 VITALS — BP 130/82 | HR 67 | Temp 97.9°F | Resp 14 | Ht 61.18 in | Wt 161.2 lb

## 2024-06-03 DIAGNOSIS — R7989 Other specified abnormal findings of blood chemistry: Secondary | ICD-10-CM | POA: Diagnosis not present

## 2024-06-03 DIAGNOSIS — M79672 Pain in left foot: Secondary | ICD-10-CM

## 2024-06-03 NOTE — Progress Notes (Signed)
 BP 130/82 (BP Location: Left Arm, Patient Position: Sitting, Cuff Size: Large)   Pulse 67   Temp 97.9 F (36.6 C) (Oral)   Resp 14   Ht 5' 1.18 (1.554 m)   Wt 161 lb 3.2 oz (73.1 kg)   LMP 10/14/1992 (Approximate)   SpO2 98%   BMI 30.28 kg/m    Subjective:    Patient ID: Linda Boyle, female    DOB: September 21, 1960, 63 y.o.   MRN: [id number]  HPI: Linda Boyle is a 64 y.o. female  Chief Complaint  Patient presents with   Ankle Pain    Does feel a little improvement in the morning pain but otherwise does still remain. No longer limping.    FOOT PAIN Follow-up today for left ankle pain. Today it is not hurting at all, feels much better today.  No recent injuries. Has done home PT. Moves and massages it a lot. Imaging did note dorsal calcaneal bursal spur. Duration: weeks Involved foot: left -- to medial ankle and then lower foot in arch Mechanism of injury: unknown Location: left Onset: gradual  Severity: mild -- when goes from sitting to standing notices it the most Quality:  dull, aching, throbbing. tender Frequency: intermittent Radiation: no Aggravating factors: weight bearing, walking, and movement notices pain initially after getting up, but after has walked awhile it gets better Alleviating factors: nothing Status: some improvement Treatments attempted:  ice, APAP, NSAIDs, and rest   Relief with NSAIDs?:  no Weakness with weight bearing or walking: no Morning stiffness: yes initially when gets up Swelling: no Redness: no Bruising: no Paresthesias / decreased sensation: no  Fevers:no   Relevant past medical, surgical, family and social history reviewed and updated as indicated. Interim medical history since our last visit reviewed. Allergies and medications reviewed and updated.  Review of Systems  Constitutional:  Negative for activity change, appetite change, diaphoresis, fatigue and fever.  Respiratory:  Negative for cough, chest tightness and  shortness of breath.   Cardiovascular:  Negative for chest pain, palpitations and leg swelling.  Gastrointestinal: Negative.   Endocrine: Negative.   Musculoskeletal:  Positive for arthralgias.  Neurological: Negative.   Psychiatric/Behavioral: Negative.      Per HPI unless specifically indicated above     Objective:    BP 130/82 (BP Location: Left Arm, Patient Position: Sitting, Cuff Size: Large)   Pulse 67   Temp 97.9 F (36.6 C) (Oral)   Resp 14   Ht 5' 1.18 (1.554 m)   Wt 161 lb 3.2 oz (73.1 kg)   LMP 10/14/1992 (Approximate)   SpO2 98%   BMI 30.28 kg/m   Wt Readings from Last 3 Encounters:  06/03/24 161 lb 3.2 oz (73.1 kg)  04/30/24 158 lb (71.7 kg)  04/09/24 157 lb 6.4 oz (71.4 kg)    Physical Exam Vitals and nursing note reviewed.  Constitutional:      General: She is awake. She is not in acute distress.    Appearance: She is well-developed and well-groomed. She is not ill-appearing or toxic-appearing.  HENT:     Head: Normocephalic.     Right Ear: Hearing and external ear normal.     Left Ear: Hearing and external ear normal.  Eyes:     General: Lids are normal.        Right eye: No discharge.        Left eye: No discharge.     Conjunctiva/sclera: Conjunctivae normal.     Pupils: Pupils are  equal, round, and reactive to light.  Neck:     Thyroid: No thyromegaly.     Vascular: No carotid bruit.  Cardiovascular:     Rate and Rhythm: Normal rate and regular rhythm.     Pulses:          Dorsalis pedis pulses are 2+ on the right side and 2+ on the left side.       Posterior tibial pulses are 2+ on the right side and 2+ on the left side.     Heart sounds: Normal heart sounds. No murmur heard.    No gallop.  Pulmonary:     Effort: Pulmonary effort is normal. No accessory muscle usage or respiratory distress.     Breath sounds: Normal breath sounds.  Abdominal:     General: Bowel sounds are normal. There is no distension.     Palpations: Abdomen is soft.      Tenderness: There is no abdominal tenderness.  Musculoskeletal:     Cervical back: Normal range of motion and neck supple.     Right lower leg: No edema.     Left lower leg: No edema.     Right foot: Normal range of motion. No foot drop.     Left foot: Normal range of motion. No foot drop.  Feet:     Right foot:     Skin integrity: Skin integrity normal.     Toenail Condition: Right toenails are normal.     Left foot:     Skin integrity: Skin integrity normal.     Toenail Condition: Left toenails are normal.     Comments: No tenderness on palpation of left ankle or heel area.  Lymphadenopathy:     Cervical: No cervical adenopathy.  Skin:    General: Skin is warm and dry.  Neurological:     Mental Status: She is alert and oriented to person, place, and time.     Deep Tendon Reflexes: Reflexes are normal and symmetric.     Reflex Scores:      Brachioradialis reflexes are 2+ on the right side and 2+ on the left side.      Patellar reflexes are 2+ on the right side and 2+ on the left side. Psychiatric:        Attention and Perception: Attention normal.        Mood and Affect: Mood normal.        Speech: Speech normal.        Behavior: Behavior normal. Behavior is cooperative.        Thought Content: Thought content normal.     Results for orders placed or performed in visit on 04/09/24  Bayer DCA Hb A1c Waived   Collection Time: 04/09/24  9:50 AM  Result Value Ref Range   HB A1C (BAYER DCA - WAIVED) 6.2 (H) 4.8 - 5.6 %  CBC with Differential/Platelet   Collection Time: 04/09/24  9:51 AM  Result Value Ref Range   WBC 3.3 (L) 3.4 - 10.8 x10E3/uL   RBC 4.66 3.77 - 5.28 x10E6/uL   Hemoglobin 14.0 11.1 - 15.9 g/dL   Hematocrit 56.9 65.9 - 46.6 %   MCV 92 79 - 97 fL   MCH 30.0 26.6 - 33.0 pg   MCHC 32.6 31.5 - 35.7 g/dL   RDW 86.2 88.2 - 84.5 %   Platelets 273 150 - 450 x10E3/uL   Neutrophils 44 Not Estab. %   Lymphs 42 Not Estab. %   Monocytes  9 Not Estab. %   Eos 4  Not Estab. %   Basos 1 Not Estab. %   Neutrophils Absolute 1.5 1.4 - 7.0 x10E3/uL   Lymphocytes Absolute 1.4 0.7 - 3.1 x10E3/uL   Monocytes Absolute 0.3 0.1 - 0.9 x10E3/uL   EOS (ABSOLUTE) 0.1 0.0 - 0.4 x10E3/uL   Basophils Absolute 0.0 0.0 - 0.2 x10E3/uL   Immature Granulocytes 0 Not Estab. %   Immature Grans (Abs) 0.0 0.0 - 0.1 x10E3/uL  Comprehensive metabolic panel with GFR   Collection Time: 04/09/24  9:51 AM  Result Value Ref Range   Glucose 96 70 - 99 mg/dL   BUN 10 8 - 27 mg/dL   Creatinine, Ser 9.13 0.57 - 1.00 mg/dL   eGFR 75 >40 fO/fpw/8.26   BUN/Creatinine Ratio 12 12 - 28   Sodium 141 134 - 144 mmol/L   Potassium 3.6 3.5 - 5.2 mmol/L   Chloride 102 96 - 106 mmol/L   CO2 25 20 - 29 mmol/L   Calcium  9.7 8.7 - 10.3 mg/dL   Total Protein 6.8 6.0 - 8.5 g/dL   Albumin 4.4 3.9 - 4.9 g/dL   Globulin, Total 2.4 1.5 - 4.5 g/dL   Bilirubin Total 0.5 0.0 - 1.2 mg/dL   Alkaline Phosphatase 64 49 - 135 IU/L   AST 23 0 - 40 IU/L   ALT 15 0 - 32 IU/L  TSH   Collection Time: 04/09/24  9:51 AM  Result Value Ref Range   TSH 1.890 0.450 - 4.500 uIU/mL  Magnesium   Collection Time: 04/09/24  9:51 AM  Result Value Ref Range   Magnesium 2.4 (H) 1.6 - 2.3 mg/dL  Lipid Panel w/o Chol/HDL Ratio   Collection Time: 04/09/24  9:51 AM  Result Value Ref Range   Cholesterol, Total 178 100 - 199 mg/dL   Triglycerides 889 0 - 149 mg/dL   HDL 55 >60 mg/dL   VLDL Cholesterol Cal 20 5 - 40 mg/dL   LDL Chol Calc (NIH) 896 (H) 0 - 99 mg/dL      Assessment & Plan:   Problem List Items Addressed This Visit       Other   Pain of left heel - Primary   Acute and improving. Imaging did note dorsal calcaneal bursal spur. Discussed options with her, including orthotics, physical therapy, and podiatry. At this time will hold off on these and if any return of pain or worsening will reach out to PCP.      Other Visit Diagnoses       Abnormal CBC measurement       Recheck levels today.    Relevant Orders   CBC with Differential/Platelet        Follow up plan: Return for as scheduled in April.SABRA

## 2024-06-03 NOTE — Assessment & Plan Note (Signed)
 Acute and improving. Imaging did note dorsal calcaneal bursal spur. Discussed options with her, including orthotics, physical therapy, and podiatry. At this time will hold off on these and if any return of pain or worsening will reach out to PCP.

## 2024-06-04 ENCOUNTER — Ambulatory Visit: Payer: Self-pay | Admitting: Nurse Practitioner

## 2024-06-04 LAB — CBC WITH DIFFERENTIAL/PLATELET
Basophils Absolute: 0.1 x10E3/uL (ref 0.0–0.2)
Basos: 1 %
EOS (ABSOLUTE): 0.1 x10E3/uL (ref 0.0–0.4)
Eos: 2 %
Hematocrit: 40.9 % (ref 34.0–46.6)
Hemoglobin: 13.2 g/dL (ref 11.1–15.9)
Immature Grans (Abs): 0 x10E3/uL (ref 0.0–0.1)
Immature Granulocytes: 0 %
Lymphocytes Absolute: 2.2 x10E3/uL (ref 0.7–3.1)
Lymphs: 42 %
MCH: 29.4 pg (ref 26.6–33.0)
MCHC: 32.3 g/dL (ref 31.5–35.7)
MCV: 91 fL (ref 79–97)
Monocytes Absolute: 0.4 x10E3/uL (ref 0.1–0.9)
Monocytes: 8 %
Neutrophils Absolute: 2.4 x10E3/uL (ref 1.4–7.0)
Neutrophils: 47 %
Platelets: 269 x10E3/uL (ref 150–450)
RBC: 4.49 x10E6/uL (ref 3.77–5.28)
RDW: 13.3 % (ref 11.7–15.4)
WBC: 5.1 x10E3/uL (ref 3.4–10.8)

## 2024-06-04 NOTE — Progress Notes (Signed)
 Contacted via MyChart  Good afternoon Linda Boyle, your labs have returned and are nice and normal. No changes needed.

## 2024-06-20 ENCOUNTER — Encounter: Payer: Self-pay | Admitting: Nurse Practitioner

## 2024-06-20 MED ORDER — ONETOUCH ULTRA VI STRP: ORAL_STRIP | 4 refills | Status: DC

## 2024-06-20 MED ORDER — ONETOUCH DELICA PLUS LANCET30G MISC: 4 refills | Status: DC

## 2024-07-07 ENCOUNTER — Other Ambulatory Visit: Payer: Self-pay

## 2024-07-07 DIAGNOSIS — E1129 Type 2 diabetes mellitus with other diabetic kidney complication: Secondary | ICD-10-CM

## 2024-07-07 MED ORDER — ACCU-CHEK SOFTCLIX LANCETS MISC: 1.0000 | Freq: Every day | 3 refills | Status: AC

## 2024-07-07 MED ORDER — ACCU-CHEK GUIDE W/DEVICE KIT: 1.0000 | PACK | Freq: Every day | 0 refills | Status: AC

## 2024-07-07 MED ORDER — ACCU-CHEK GUIDE TEST VI STRP: 1.0000 | ORAL_STRIP | Freq: Every day | 3 refills | Status: AC

## 2024-10-10 ENCOUNTER — Ambulatory Visit: Admitting: Nurse Practitioner
